# Patient Record
Sex: Female | Born: 1955 | Race: Black or African American | Hispanic: No | Smoking: Never smoker
Health system: Southern US, Community
[De-identification: ages and names within clinical notes are randomized; demographics above are authoritative.]

## PROBLEM LIST (undated history)

## (undated) DIAGNOSIS — K219 Gastro-esophageal reflux disease without esophagitis: Secondary | ICD-10-CM

## (undated) DIAGNOSIS — H269 Unspecified cataract: Secondary | ICD-10-CM

## (undated) DIAGNOSIS — T7840XA Allergy, unspecified, initial encounter: Secondary | ICD-10-CM

## (undated) DIAGNOSIS — F419 Anxiety disorder, unspecified: Secondary | ICD-10-CM

## (undated) DIAGNOSIS — F329 Major depressive disorder, single episode, unspecified: Secondary | ICD-10-CM

## (undated) DIAGNOSIS — M1990 Unspecified osteoarthritis, unspecified site: Secondary | ICD-10-CM

## (undated) DIAGNOSIS — D219 Benign neoplasm of connective and other soft tissue, unspecified: Secondary | ICD-10-CM

## (undated) DIAGNOSIS — E785 Hyperlipidemia, unspecified: Secondary | ICD-10-CM

## (undated) DIAGNOSIS — E079 Disorder of thyroid, unspecified: Secondary | ICD-10-CM

## (undated) DIAGNOSIS — F32A Depression, unspecified: Secondary | ICD-10-CM

## (undated) DIAGNOSIS — I10 Essential (primary) hypertension: Secondary | ICD-10-CM

## (undated) DIAGNOSIS — J45909 Unspecified asthma, uncomplicated: Secondary | ICD-10-CM

## (undated) HISTORY — DX: Unspecified osteoarthritis, unspecified site: M19.90

## (undated) HISTORY — DX: Gastro-esophageal reflux disease without esophagitis: K21.9

## (undated) HISTORY — DX: Hyperlipidemia, unspecified: E78.5

## (undated) HISTORY — DX: Unspecified cataract: H26.9

## (undated) HISTORY — DX: Unspecified asthma, uncomplicated: J45.909

## (undated) HISTORY — DX: Disorder of thyroid, unspecified: E07.9

## (undated) HISTORY — PX: HERNIA REPAIR: SHX51

## (undated) HISTORY — PX: LIPOMA EXCISION: SHX5283

## (undated) HISTORY — PX: OTHER SURGICAL HISTORY: SHX169

## (undated) HISTORY — DX: Allergy, unspecified, initial encounter: T78.40XA

---

## 1997-09-30 ENCOUNTER — Encounter: Admission: RE | Admit: 1997-09-30 | Discharge: 1997-09-30 | Payer: Self-pay | Admitting: Family Medicine

## 1997-10-14 ENCOUNTER — Other Ambulatory Visit: Admission: RE | Admit: 1997-10-14 | Discharge: 1997-10-14 | Payer: Self-pay

## 1997-10-14 ENCOUNTER — Encounter: Admission: RE | Admit: 1997-10-14 | Discharge: 1997-10-14 | Payer: Self-pay | Admitting: Family Medicine

## 1997-10-25 ENCOUNTER — Emergency Department (HOSPITAL_COMMUNITY): Admission: EM | Admit: 1997-10-25 | Discharge: 1997-10-25 | Payer: Self-pay | Admitting: Emergency Medicine

## 1997-10-26 ENCOUNTER — Encounter: Admission: RE | Admit: 1997-10-26 | Discharge: 1997-10-26 | Payer: Self-pay | Admitting: Family Medicine

## 1997-11-04 ENCOUNTER — Encounter: Admission: RE | Admit: 1997-11-04 | Discharge: 1997-11-04 | Payer: Self-pay | Admitting: Family Medicine

## 1997-11-06 ENCOUNTER — Emergency Department (HOSPITAL_COMMUNITY): Admission: EM | Admit: 1997-11-06 | Discharge: 1997-11-06 | Payer: Self-pay | Admitting: Emergency Medicine

## 1997-11-09 ENCOUNTER — Encounter: Admission: RE | Admit: 1997-11-09 | Discharge: 1997-11-09 | Payer: Self-pay | Admitting: Sports Medicine

## 1997-11-24 ENCOUNTER — Encounter: Admission: RE | Admit: 1997-11-24 | Discharge: 1997-11-24 | Payer: Self-pay | Admitting: Family Medicine

## 1998-01-21 ENCOUNTER — Emergency Department (HOSPITAL_COMMUNITY): Admission: EM | Admit: 1998-01-21 | Discharge: 1998-01-21 | Payer: Self-pay | Admitting: Emergency Medicine

## 1998-01-24 ENCOUNTER — Emergency Department (HOSPITAL_COMMUNITY): Admission: EM | Admit: 1998-01-24 | Discharge: 1998-01-24 | Payer: Self-pay | Admitting: Emergency Medicine

## 1998-03-16 ENCOUNTER — Encounter: Admission: RE | Admit: 1998-03-16 | Discharge: 1998-03-16 | Payer: Self-pay | Admitting: Family Medicine

## 1998-03-18 ENCOUNTER — Encounter: Admission: RE | Admit: 1998-03-18 | Discharge: 1998-03-18 | Payer: Self-pay | Admitting: Family Medicine

## 1998-07-18 ENCOUNTER — Emergency Department (HOSPITAL_COMMUNITY): Admission: EM | Admit: 1998-07-18 | Discharge: 1998-07-18 | Payer: Self-pay | Admitting: Emergency Medicine

## 1998-08-23 ENCOUNTER — Encounter: Admission: RE | Admit: 1998-08-23 | Discharge: 1998-08-23 | Payer: Self-pay | Admitting: Family Medicine

## 1998-10-12 ENCOUNTER — Encounter: Admission: RE | Admit: 1998-10-12 | Discharge: 1998-10-12 | Payer: Self-pay | Admitting: Family Medicine

## 1998-10-13 ENCOUNTER — Emergency Department (HOSPITAL_COMMUNITY): Admission: EM | Admit: 1998-10-13 | Discharge: 1998-10-13 | Payer: Self-pay | Admitting: Emergency Medicine

## 1998-10-19 ENCOUNTER — Encounter: Admission: RE | Admit: 1998-10-19 | Discharge: 1998-10-19 | Payer: Self-pay | Admitting: Family Medicine

## 1998-10-26 ENCOUNTER — Encounter: Admission: RE | Admit: 1998-10-26 | Discharge: 1998-10-26 | Payer: Self-pay | Admitting: Family Medicine

## 1998-10-28 ENCOUNTER — Emergency Department (HOSPITAL_COMMUNITY): Admission: EM | Admit: 1998-10-28 | Discharge: 1998-10-28 | Payer: Self-pay | Admitting: Emergency Medicine

## 1998-11-11 ENCOUNTER — Emergency Department (HOSPITAL_COMMUNITY): Admission: EM | Admit: 1998-11-11 | Discharge: 1998-11-11 | Payer: Self-pay | Admitting: Emergency Medicine

## 1998-11-17 ENCOUNTER — Encounter: Admission: RE | Admit: 1998-11-17 | Discharge: 1998-11-17 | Payer: Self-pay | Admitting: Family Medicine

## 1998-11-18 ENCOUNTER — Encounter: Admission: RE | Admit: 1998-11-18 | Discharge: 1998-11-18 | Payer: Self-pay | Admitting: Sports Medicine

## 1998-11-22 ENCOUNTER — Encounter: Admission: RE | Admit: 1998-11-22 | Discharge: 1998-11-22 | Payer: Self-pay | Admitting: Sports Medicine

## 1998-12-07 ENCOUNTER — Encounter: Admission: RE | Admit: 1998-12-07 | Discharge: 1998-12-07 | Payer: Self-pay | Admitting: Family Medicine

## 1998-12-19 ENCOUNTER — Encounter: Admission: RE | Admit: 1998-12-19 | Discharge: 1998-12-19 | Payer: Self-pay | Admitting: Family Medicine

## 1999-01-03 ENCOUNTER — Encounter: Admission: RE | Admit: 1999-01-03 | Discharge: 1999-01-03 | Payer: Self-pay | Admitting: Sports Medicine

## 1999-01-07 ENCOUNTER — Emergency Department (HOSPITAL_COMMUNITY): Admission: EM | Admit: 1999-01-07 | Discharge: 1999-01-07 | Payer: Self-pay

## 1999-01-31 ENCOUNTER — Encounter: Admission: RE | Admit: 1999-01-31 | Discharge: 1999-01-31 | Payer: Self-pay | Admitting: Sports Medicine

## 1999-02-04 ENCOUNTER — Emergency Department (HOSPITAL_COMMUNITY): Admission: EM | Admit: 1999-02-04 | Discharge: 1999-02-04 | Payer: Self-pay | Admitting: Emergency Medicine

## 1999-03-14 ENCOUNTER — Encounter: Admission: RE | Admit: 1999-03-14 | Discharge: 1999-03-14 | Payer: Self-pay | Admitting: Sports Medicine

## 1999-06-17 ENCOUNTER — Emergency Department (HOSPITAL_COMMUNITY): Admission: EM | Admit: 1999-06-17 | Discharge: 1999-06-17 | Payer: Self-pay | Admitting: Emergency Medicine

## 1999-07-11 ENCOUNTER — Encounter: Admission: RE | Admit: 1999-07-11 | Discharge: 1999-07-11 | Payer: Self-pay | Admitting: Sports Medicine

## 1999-07-21 ENCOUNTER — Encounter: Admission: RE | Admit: 1999-07-21 | Discharge: 1999-07-21 | Payer: Self-pay | Admitting: Family Medicine

## 1999-08-08 ENCOUNTER — Emergency Department (HOSPITAL_COMMUNITY): Admission: EM | Admit: 1999-08-08 | Discharge: 1999-08-08 | Payer: Self-pay | Admitting: Emergency Medicine

## 1999-08-10 ENCOUNTER — Emergency Department (HOSPITAL_COMMUNITY): Admission: EM | Admit: 1999-08-10 | Discharge: 1999-08-10 | Payer: Self-pay | Admitting: Emergency Medicine

## 1999-08-10 ENCOUNTER — Encounter: Admission: RE | Admit: 1999-08-10 | Discharge: 1999-08-10 | Payer: Self-pay | Admitting: Family Medicine

## 1999-08-17 ENCOUNTER — Encounter: Payer: Self-pay | Admitting: *Deleted

## 1999-08-17 ENCOUNTER — Encounter: Admission: RE | Admit: 1999-08-17 | Discharge: 1999-08-17 | Payer: Self-pay | Admitting: *Deleted

## 1999-08-23 ENCOUNTER — Other Ambulatory Visit: Admission: RE | Admit: 1999-08-23 | Discharge: 1999-08-23 | Payer: Self-pay | Admitting: Obstetrics & Gynecology

## 1999-08-23 ENCOUNTER — Encounter: Admission: RE | Admit: 1999-08-23 | Discharge: 1999-08-23 | Payer: Self-pay | Admitting: Family Medicine

## 1999-08-30 ENCOUNTER — Encounter: Admission: RE | Admit: 1999-08-30 | Discharge: 1999-08-30 | Payer: Self-pay | Admitting: Family Medicine

## 1999-11-01 ENCOUNTER — Encounter: Admission: RE | Admit: 1999-11-01 | Discharge: 1999-11-01 | Payer: Self-pay | Admitting: Family Medicine

## 1999-11-16 ENCOUNTER — Encounter: Admission: RE | Admit: 1999-11-16 | Discharge: 1999-11-16 | Payer: Self-pay | Admitting: Family Medicine

## 2000-01-02 ENCOUNTER — Emergency Department (HOSPITAL_COMMUNITY): Admission: EM | Admit: 2000-01-02 | Discharge: 2000-01-02 | Payer: Self-pay | Admitting: Emergency Medicine

## 2000-01-02 ENCOUNTER — Encounter: Payer: Self-pay | Admitting: Emergency Medicine

## 2000-03-24 ENCOUNTER — Emergency Department (HOSPITAL_COMMUNITY): Admission: EM | Admit: 2000-03-24 | Discharge: 2000-03-24 | Payer: Self-pay | Admitting: Emergency Medicine

## 2000-03-24 ENCOUNTER — Encounter: Payer: Self-pay | Admitting: Emergency Medicine

## 2000-03-29 ENCOUNTER — Encounter: Admission: RE | Admit: 2000-03-29 | Discharge: 2000-03-29 | Payer: Self-pay | Admitting: Family Medicine

## 2000-05-03 ENCOUNTER — Encounter: Admission: RE | Admit: 2000-05-03 | Discharge: 2000-05-03 | Payer: Self-pay | Admitting: Family Medicine

## 2000-06-23 ENCOUNTER — Emergency Department (HOSPITAL_COMMUNITY): Admission: EM | Admit: 2000-06-23 | Discharge: 2000-06-23 | Payer: Self-pay | Admitting: Emergency Medicine

## 2000-09-20 ENCOUNTER — Encounter: Admission: RE | Admit: 2000-09-20 | Discharge: 2000-09-20 | Payer: Self-pay | Admitting: Family Medicine

## 2000-09-24 ENCOUNTER — Encounter: Admission: RE | Admit: 2000-09-24 | Discharge: 2000-09-24 | Payer: Self-pay | Admitting: Family Medicine

## 2000-09-26 ENCOUNTER — Encounter: Admission: RE | Admit: 2000-09-26 | Discharge: 2000-09-26 | Payer: Self-pay | Admitting: Family Medicine

## 2000-10-15 ENCOUNTER — Emergency Department (HOSPITAL_COMMUNITY): Admission: EM | Admit: 2000-10-15 | Discharge: 2000-10-15 | Payer: Self-pay | Admitting: Emergency Medicine

## 2000-12-10 ENCOUNTER — Encounter: Admission: RE | Admit: 2000-12-10 | Discharge: 2000-12-10 | Payer: Self-pay | Admitting: Family Medicine

## 2001-01-27 ENCOUNTER — Other Ambulatory Visit: Admission: RE | Admit: 2001-01-27 | Discharge: 2001-01-27 | Payer: Self-pay | Admitting: *Deleted

## 2001-01-27 ENCOUNTER — Encounter: Admission: RE | Admit: 2001-01-27 | Discharge: 2001-01-27 | Payer: Self-pay | Admitting: Family Medicine

## 2001-02-19 ENCOUNTER — Encounter: Admission: RE | Admit: 2001-02-19 | Discharge: 2001-02-19 | Payer: Self-pay | Admitting: Family Medicine

## 2001-04-01 ENCOUNTER — Encounter: Admission: RE | Admit: 2001-04-01 | Discharge: 2001-04-01 | Payer: Self-pay | Admitting: Family Medicine

## 2001-05-05 ENCOUNTER — Encounter: Admission: RE | Admit: 2001-05-05 | Discharge: 2001-05-05 | Payer: Self-pay | Admitting: Family Medicine

## 2001-05-19 ENCOUNTER — Encounter: Admission: RE | Admit: 2001-05-19 | Discharge: 2001-05-19 | Payer: Self-pay | Admitting: Sports Medicine

## 2001-07-01 ENCOUNTER — Encounter: Admission: RE | Admit: 2001-07-01 | Discharge: 2001-07-01 | Payer: Self-pay | Admitting: Family Medicine

## 2001-07-14 ENCOUNTER — Encounter: Admission: RE | Admit: 2001-07-14 | Discharge: 2001-07-14 | Payer: Self-pay | Admitting: Family Medicine

## 2001-07-18 ENCOUNTER — Encounter: Admission: RE | Admit: 2001-07-18 | Discharge: 2001-07-18 | Payer: Self-pay | Admitting: Family Medicine

## 2001-08-29 ENCOUNTER — Encounter: Admission: RE | Admit: 2001-08-29 | Discharge: 2001-08-29 | Payer: Self-pay | Admitting: Family Medicine

## 2001-10-09 ENCOUNTER — Encounter: Admission: RE | Admit: 2001-10-09 | Discharge: 2001-10-09 | Payer: Self-pay | Admitting: Family Medicine

## 2001-11-27 ENCOUNTER — Emergency Department (HOSPITAL_COMMUNITY): Admission: EM | Admit: 2001-11-27 | Discharge: 2001-11-27 | Payer: Self-pay | Admitting: Emergency Medicine

## 2001-11-28 ENCOUNTER — Encounter: Admission: RE | Admit: 2001-11-28 | Discharge: 2001-11-28 | Payer: Self-pay | Admitting: Family Medicine

## 2001-12-17 ENCOUNTER — Encounter: Admission: RE | Admit: 2001-12-17 | Discharge: 2001-12-17 | Payer: Self-pay | Admitting: Family Medicine

## 2002-01-30 ENCOUNTER — Encounter: Admission: RE | Admit: 2002-01-30 | Discharge: 2002-01-30 | Payer: Self-pay | Admitting: Family Medicine

## 2002-03-26 ENCOUNTER — Encounter: Admission: RE | Admit: 2002-03-26 | Discharge: 2002-03-26 | Payer: Self-pay | Admitting: Family Medicine

## 2002-05-06 ENCOUNTER — Emergency Department (HOSPITAL_COMMUNITY): Admission: EM | Admit: 2002-05-06 | Discharge: 2002-05-06 | Payer: Self-pay | Admitting: Emergency Medicine

## 2002-05-18 ENCOUNTER — Encounter: Admission: RE | Admit: 2002-05-18 | Discharge: 2002-05-18 | Payer: Self-pay | Admitting: Family Medicine

## 2002-05-28 ENCOUNTER — Encounter: Admission: RE | Admit: 2002-05-28 | Discharge: 2002-05-28 | Payer: Self-pay | Admitting: Family Medicine

## 2002-06-29 ENCOUNTER — Encounter: Admission: RE | Admit: 2002-06-29 | Discharge: 2002-06-29 | Payer: Self-pay | Admitting: Sports Medicine

## 2002-07-28 ENCOUNTER — Encounter: Admission: RE | Admit: 2002-07-28 | Discharge: 2002-07-28 | Payer: Self-pay | Admitting: Family Medicine

## 2002-08-06 ENCOUNTER — Ambulatory Visit (HOSPITAL_COMMUNITY): Admission: RE | Admit: 2002-08-06 | Discharge: 2002-08-06 | Payer: Self-pay | Admitting: Sports Medicine

## 2002-08-23 ENCOUNTER — Emergency Department (HOSPITAL_COMMUNITY): Admission: EM | Admit: 2002-08-23 | Discharge: 2002-08-24 | Payer: Self-pay | Admitting: Emergency Medicine

## 2002-09-04 ENCOUNTER — Encounter: Admission: RE | Admit: 2002-09-04 | Discharge: 2002-09-04 | Payer: Self-pay | Admitting: Family Medicine

## 2002-11-05 ENCOUNTER — Encounter: Admission: RE | Admit: 2002-11-05 | Discharge: 2002-11-05 | Payer: Self-pay | Admitting: Family Medicine

## 2002-12-08 ENCOUNTER — Encounter: Admission: RE | Admit: 2002-12-08 | Discharge: 2002-12-08 | Payer: Self-pay | Admitting: Sports Medicine

## 2003-05-14 ENCOUNTER — Emergency Department (HOSPITAL_COMMUNITY): Admission: AD | Admit: 2003-05-14 | Discharge: 2003-05-14 | Payer: Self-pay | Admitting: Family Medicine

## 2003-08-18 ENCOUNTER — Ambulatory Visit (HOSPITAL_COMMUNITY): Admission: RE | Admit: 2003-08-18 | Discharge: 2003-08-18 | Payer: Self-pay | Admitting: Sports Medicine

## 2003-09-13 ENCOUNTER — Other Ambulatory Visit: Admission: RE | Admit: 2003-09-13 | Discharge: 2003-09-13 | Payer: Self-pay | Admitting: Family Medicine

## 2003-09-13 ENCOUNTER — Encounter: Admission: RE | Admit: 2003-09-13 | Discharge: 2003-09-13 | Payer: Self-pay | Admitting: Family Medicine

## 2003-09-29 ENCOUNTER — Emergency Department (HOSPITAL_COMMUNITY): Admission: EM | Admit: 2003-09-29 | Discharge: 2003-09-29 | Payer: Self-pay | Admitting: *Deleted

## 2004-02-02 ENCOUNTER — Ambulatory Visit: Payer: Self-pay | Admitting: Family Medicine

## 2004-03-16 ENCOUNTER — Ambulatory Visit: Payer: Self-pay | Admitting: Family Medicine

## 2004-04-14 ENCOUNTER — Ambulatory Visit: Payer: Self-pay | Admitting: Family Medicine

## 2004-08-09 ENCOUNTER — Ambulatory Visit: Payer: Self-pay | Admitting: Family Medicine

## 2005-02-19 ENCOUNTER — Ambulatory Visit (HOSPITAL_COMMUNITY): Admission: RE | Admit: 2005-02-19 | Discharge: 2005-02-19 | Payer: Self-pay | Admitting: Family Medicine

## 2005-02-24 ENCOUNTER — Emergency Department (HOSPITAL_COMMUNITY): Admission: EM | Admit: 2005-02-24 | Discharge: 2005-02-24 | Payer: Self-pay | Admitting: Emergency Medicine

## 2005-04-26 ENCOUNTER — Ambulatory Visit: Payer: Self-pay | Admitting: Family Medicine

## 2005-04-26 ENCOUNTER — Other Ambulatory Visit: Admission: RE | Admit: 2005-04-26 | Discharge: 2005-04-26 | Payer: Self-pay | Admitting: Family Medicine

## 2005-04-28 ENCOUNTER — Encounter (INDEPENDENT_AMBULATORY_CARE_PROVIDER_SITE_OTHER): Payer: Self-pay | Admitting: *Deleted

## 2005-06-22 ENCOUNTER — Ambulatory Visit: Payer: Self-pay | Admitting: Family Medicine

## 2005-10-13 ENCOUNTER — Emergency Department (HOSPITAL_COMMUNITY): Admission: EM | Admit: 2005-10-13 | Discharge: 2005-10-13 | Payer: Self-pay | Admitting: Family Medicine

## 2005-12-21 ENCOUNTER — Ambulatory Visit: Payer: Self-pay | Admitting: Family Medicine

## 2006-03-21 DIAGNOSIS — I10 Essential (primary) hypertension: Secondary | ICD-10-CM | POA: Insufficient documentation

## 2006-03-21 DIAGNOSIS — D259 Leiomyoma of uterus, unspecified: Secondary | ICD-10-CM

## 2006-03-21 HISTORY — DX: Leiomyoma of uterus, unspecified: D25.9

## 2006-03-22 ENCOUNTER — Ambulatory Visit (HOSPITAL_COMMUNITY): Admission: RE | Admit: 2006-03-22 | Discharge: 2006-03-22 | Payer: Self-pay | Admitting: Internal Medicine

## 2006-03-22 ENCOUNTER — Encounter (INDEPENDENT_AMBULATORY_CARE_PROVIDER_SITE_OTHER): Payer: Self-pay | Admitting: *Deleted

## 2006-05-27 ENCOUNTER — Telehealth (INDEPENDENT_AMBULATORY_CARE_PROVIDER_SITE_OTHER): Payer: Self-pay | Admitting: *Deleted

## 2006-05-29 ENCOUNTER — Emergency Department (HOSPITAL_COMMUNITY): Admission: EM | Admit: 2006-05-29 | Discharge: 2006-05-29 | Payer: Self-pay | Admitting: Family Medicine

## 2006-06-10 ENCOUNTER — Telehealth (INDEPENDENT_AMBULATORY_CARE_PROVIDER_SITE_OTHER): Payer: Self-pay | Admitting: *Deleted

## 2006-06-11 ENCOUNTER — Ambulatory Visit: Payer: Self-pay | Admitting: Family Medicine

## 2006-06-11 ENCOUNTER — Encounter (INDEPENDENT_AMBULATORY_CARE_PROVIDER_SITE_OTHER): Payer: Self-pay | Admitting: Family Medicine

## 2006-06-11 LAB — CONVERTED CEMR LAB
Chlamydia, DNA Probe: NEGATIVE
KOH Prep: NEGATIVE
Ketones, urine, test strip: NEGATIVE
Nitrite: NEGATIVE
Urobilinogen, UA: 0.2
WBC Urine, dipstick: NEGATIVE
pH: 6

## 2006-07-05 ENCOUNTER — Encounter (INDEPENDENT_AMBULATORY_CARE_PROVIDER_SITE_OTHER): Payer: Self-pay | Admitting: Family Medicine

## 2006-07-05 ENCOUNTER — Ambulatory Visit: Payer: Self-pay | Admitting: Family Medicine

## 2006-07-05 DIAGNOSIS — Z8639 Personal history of other endocrine, nutritional and metabolic disease: Secondary | ICD-10-CM

## 2006-07-05 DIAGNOSIS — Z862 Personal history of diseases of the blood and blood-forming organs and certain disorders involving the immune mechanism: Secondary | ICD-10-CM | POA: Insufficient documentation

## 2006-07-05 LAB — CONVERTED CEMR LAB
CO2: 25 meq/L (ref 19–32)
Calcium: 9.5 mg/dL (ref 8.4–10.5)
Cholesterol: 171 mg/dL (ref 0–200)
Glucose, Bld: 108 mg/dL — ABNORMAL HIGH (ref 70–99)
HDL: 58 mg/dL (ref >39)
LDL Cholesterol: 100 mg/dL — ABNORMAL HIGH (ref 0–99)
Potassium: 3.6 meq/L (ref 3.5–5.3)
Sodium: 137 meq/L (ref 135–145)
TSH: 1.474 microintl units/mL (ref 0.350–5.50)
Total CHOL/HDL Ratio: 2.9
Triglycerides: 63 mg/dL (ref <150)

## 2006-07-09 ENCOUNTER — Encounter: Admission: RE | Admit: 2006-07-09 | Discharge: 2006-07-09 | Payer: Self-pay | Admitting: Sports Medicine

## 2006-07-11 ENCOUNTER — Telehealth: Payer: Self-pay | Admitting: *Deleted

## 2006-09-08 ENCOUNTER — Emergency Department (HOSPITAL_COMMUNITY): Admission: EM | Admit: 2006-09-08 | Discharge: 2006-09-08 | Payer: Self-pay | Admitting: Emergency Medicine

## 2007-02-26 ENCOUNTER — Ambulatory Visit: Payer: Self-pay | Admitting: Family Medicine

## 2007-02-26 ENCOUNTER — Encounter (INDEPENDENT_AMBULATORY_CARE_PROVIDER_SITE_OTHER): Payer: Self-pay | Admitting: Family Medicine

## 2007-02-26 LAB — CONVERTED CEMR LAB
Chlamydia, DNA Probe: NEGATIVE
GC Probe Amp, Genital: NEGATIVE
Whiff Test: POSITIVE

## 2007-02-27 ENCOUNTER — Telehealth (INDEPENDENT_AMBULATORY_CARE_PROVIDER_SITE_OTHER): Payer: Self-pay | Admitting: Family Medicine

## 2007-03-06 ENCOUNTER — Telehealth: Payer: Self-pay | Admitting: *Deleted

## 2007-03-14 ENCOUNTER — Telehealth: Payer: Self-pay | Admitting: *Deleted

## 2007-04-15 ENCOUNTER — Encounter: Payer: Self-pay | Admitting: Family Medicine

## 2007-04-15 ENCOUNTER — Ambulatory Visit: Payer: Self-pay | Admitting: Family Medicine

## 2007-04-15 LAB — CONVERTED CEMR LAB
Nitrite: NEGATIVE
Urobilinogen, UA: 0.2
pH: 6

## 2007-04-16 ENCOUNTER — Encounter: Payer: Self-pay | Admitting: Family Medicine

## 2007-06-18 ENCOUNTER — Telehealth: Payer: Self-pay | Admitting: *Deleted

## 2007-06-19 ENCOUNTER — Ambulatory Visit: Payer: Self-pay | Admitting: Family Medicine

## 2007-11-19 ENCOUNTER — Ambulatory Visit: Payer: Self-pay | Admitting: Family Medicine

## 2007-11-19 ENCOUNTER — Telehealth (INDEPENDENT_AMBULATORY_CARE_PROVIDER_SITE_OTHER): Payer: Self-pay | Admitting: *Deleted

## 2007-11-27 ENCOUNTER — Telehealth (INDEPENDENT_AMBULATORY_CARE_PROVIDER_SITE_OTHER): Payer: Self-pay | Admitting: *Deleted

## 2007-11-30 ENCOUNTER — Emergency Department (HOSPITAL_COMMUNITY): Admission: EM | Admit: 2007-11-30 | Discharge: 2007-11-30 | Payer: Self-pay | Admitting: Emergency Medicine

## 2007-12-29 ENCOUNTER — Telehealth: Payer: Self-pay | Admitting: *Deleted

## 2007-12-29 ENCOUNTER — Ambulatory Visit: Payer: Self-pay | Admitting: Family Medicine

## 2007-12-29 ENCOUNTER — Encounter: Payer: Self-pay | Admitting: Family Medicine

## 2007-12-29 LAB — CONVERTED CEMR LAB
Bilirubin Urine: NEGATIVE
Chlamydia, DNA Probe: NEGATIVE
GC Probe Amp, Genital: NEGATIVE
Glucose, Urine, Semiquant: NEGATIVE
Nitrite: NEGATIVE
Protein, U semiquant: 30
Specific Gravity, Urine: 1.025
Urobilinogen, UA: 1
pH: 7

## 2008-01-19 ENCOUNTER — Ambulatory Visit: Payer: Self-pay | Admitting: Family Medicine

## 2008-01-19 ENCOUNTER — Encounter: Payer: Self-pay | Admitting: Family Medicine

## 2008-01-19 DIAGNOSIS — E669 Obesity, unspecified: Secondary | ICD-10-CM | POA: Insufficient documentation

## 2008-01-21 LAB — CONVERTED CEMR LAB
BUN: 10 mg/dL (ref 6–23)
CO2: 26 meq/L (ref 19–32)
Calcium: 9.1 mg/dL (ref 8.4–10.5)
Chloride: 106 meq/L (ref 96–112)
Potassium: 4 meq/L (ref 3.5–5.3)
Sodium: 142 meq/L (ref 135–145)

## 2008-02-04 ENCOUNTER — Telehealth: Payer: Self-pay | Admitting: *Deleted

## 2008-03-24 ENCOUNTER — Telehealth: Payer: Self-pay | Admitting: Family Medicine

## 2008-03-26 ENCOUNTER — Ambulatory Visit: Payer: Self-pay | Admitting: Family Medicine

## 2008-04-06 ENCOUNTER — Telehealth: Payer: Self-pay | Admitting: *Deleted

## 2008-04-09 ENCOUNTER — Ambulatory Visit: Payer: Self-pay | Admitting: Family Medicine

## 2008-04-13 ENCOUNTER — Ambulatory Visit: Payer: Self-pay | Admitting: Family Medicine

## 2008-04-28 ENCOUNTER — Telehealth: Payer: Self-pay | Admitting: Family Medicine

## 2008-04-28 ENCOUNTER — Ambulatory Visit: Payer: Self-pay | Admitting: Family Medicine

## 2008-04-28 LAB — CONVERTED CEMR LAB: Whiff Test: POSITIVE

## 2008-05-04 ENCOUNTER — Telehealth: Payer: Self-pay | Admitting: Family Medicine

## 2008-05-05 ENCOUNTER — Telehealth: Payer: Self-pay | Admitting: Family Medicine

## 2008-05-11 ENCOUNTER — Telehealth: Payer: Self-pay | Admitting: Family Medicine

## 2008-05-28 ENCOUNTER — Encounter: Payer: Self-pay | Admitting: Family Medicine

## 2008-05-28 ENCOUNTER — Ambulatory Visit: Payer: Self-pay | Admitting: Family Medicine

## 2008-05-28 LAB — CONVERTED CEMR LAB: Whiff Test: NEGATIVE

## 2008-06-17 ENCOUNTER — Telehealth: Payer: Self-pay | Admitting: Family Medicine

## 2008-08-31 ENCOUNTER — Telehealth: Payer: Self-pay | Admitting: *Deleted

## 2008-08-31 ENCOUNTER — Ambulatory Visit: Payer: Self-pay | Admitting: Family Medicine

## 2008-09-21 ENCOUNTER — Encounter: Admission: RE | Admit: 2008-09-21 | Discharge: 2008-09-21 | Payer: Self-pay | Admitting: Family Medicine

## 2008-09-28 ENCOUNTER — Encounter: Payer: Self-pay | Admitting: Family Medicine

## 2008-09-28 ENCOUNTER — Telehealth: Payer: Self-pay | Admitting: Family Medicine

## 2008-09-30 ENCOUNTER — Ambulatory Visit: Payer: Self-pay | Admitting: Family Medicine

## 2008-09-30 ENCOUNTER — Encounter: Payer: Self-pay | Admitting: Family Medicine

## 2008-09-30 LAB — CONVERTED CEMR LAB
Specific Gravity, Urine: 1.025
Urobilinogen, UA: 0.2
WBC Urine, dipstick: NEGATIVE
pH: 6

## 2008-10-01 LAB — CONVERTED CEMR LAB
CO2: 23 meq/L (ref 19–32)
Calcium: 9 mg/dL (ref 8.4–10.5)
Chloride: 108 meq/L (ref 96–112)
Creatinine, Ser: 0.64 mg/dL (ref 0.40–1.20)
Potassium: 3.4 meq/L — ABNORMAL LOW (ref 3.5–5.3)

## 2009-01-12 ENCOUNTER — Ambulatory Visit: Payer: Self-pay | Admitting: Family Medicine

## 2009-01-18 ENCOUNTER — Encounter: Payer: Self-pay | Admitting: Family Medicine

## 2009-01-18 ENCOUNTER — Ambulatory Visit: Payer: Self-pay | Admitting: Family Medicine

## 2009-01-18 ENCOUNTER — Telehealth: Payer: Self-pay | Admitting: Family Medicine

## 2009-01-19 LAB — CONVERTED CEMR LAB
Alkaline Phosphatase: 76 units/L (ref 39–117)
CO2: 26 meq/L (ref 19–32)
Calcium: 9.1 mg/dL (ref 8.4–10.5)
Creatinine, Ser: 0.68 mg/dL (ref 0.40–1.20)
Potassium: 3 meq/L — ABNORMAL LOW (ref 3.5–5.3)
Sodium: 143 meq/L (ref 135–145)
Total Protein: 7.3 g/dL (ref 6.0–8.3)

## 2009-01-20 ENCOUNTER — Telehealth: Payer: Self-pay | Admitting: *Deleted

## 2009-02-21 ENCOUNTER — Encounter (INDEPENDENT_AMBULATORY_CARE_PROVIDER_SITE_OTHER): Payer: Self-pay | Admitting: *Deleted

## 2009-03-14 ENCOUNTER — Telehealth: Payer: Self-pay | Admitting: Family Medicine

## 2009-03-14 ENCOUNTER — Ambulatory Visit: Payer: Self-pay | Admitting: Family Medicine

## 2009-03-14 ENCOUNTER — Encounter: Payer: Self-pay | Admitting: Family Medicine

## 2009-03-15 LAB — CONVERTED CEMR LAB
BUN: 11 mg/dL (ref 6–23)
Calcium: 9.1 mg/dL (ref 8.4–10.5)
Cholesterol: 173 mg/dL (ref 0–200)
Creatinine, Ser: 0.7 mg/dL (ref 0.40–1.20)
Glucose, Bld: 101 mg/dL — ABNORMAL HIGH (ref 70–99)
TSH: 1.017 microintl units/mL (ref 0.350–4.500)
Total CHOL/HDL Ratio: 2.9
VLDL: 21 mg/dL (ref 0–40)

## 2009-03-29 ENCOUNTER — Telehealth: Payer: Self-pay | Admitting: Family Medicine

## 2009-03-30 ENCOUNTER — Encounter: Payer: Self-pay | Admitting: Family Medicine

## 2009-03-30 ENCOUNTER — Ambulatory Visit: Payer: Self-pay | Admitting: Family Medicine

## 2009-03-30 DIAGNOSIS — E876 Hypokalemia: Secondary | ICD-10-CM

## 2009-03-30 HISTORY — DX: Hypokalemia: E87.6

## 2009-03-30 LAB — CONVERTED CEMR LAB
Bilirubin Urine: NEGATIVE
Nitrite: NEGATIVE
WBC Urine, dipstick: NEGATIVE
Whiff Test: NEGATIVE

## 2009-04-01 ENCOUNTER — Encounter (INDEPENDENT_AMBULATORY_CARE_PROVIDER_SITE_OTHER): Payer: Self-pay | Admitting: *Deleted

## 2009-04-01 LAB — CONVERTED CEMR LAB
Chlamydia, DNA Probe: NEGATIVE
GC Probe Amp, Genital: NEGATIVE

## 2009-08-19 ENCOUNTER — Ambulatory Visit: Payer: Self-pay | Admitting: Family Medicine

## 2009-08-19 LAB — CONVERTED CEMR LAB: Whiff Test: NEGATIVE

## 2009-08-20 ENCOUNTER — Emergency Department (HOSPITAL_COMMUNITY): Admission: EM | Admit: 2009-08-20 | Discharge: 2009-08-20 | Payer: Self-pay | Admitting: Emergency Medicine

## 2009-09-30 ENCOUNTER — Encounter: Payer: Self-pay | Admitting: Family Medicine

## 2009-11-08 ENCOUNTER — Telehealth: Payer: Self-pay | Admitting: Family Medicine

## 2009-11-10 ENCOUNTER — Encounter: Payer: Self-pay | Admitting: Family Medicine

## 2009-11-10 ENCOUNTER — Ambulatory Visit: Payer: Self-pay | Admitting: Family Medicine

## 2009-11-10 LAB — CONVERTED CEMR LAB
CO2: 25 meq/L (ref 19–32)
Calcium: 9.7 mg/dL (ref 8.4–10.5)
Glucose, Bld: 94 mg/dL (ref 70–99)
Potassium: 3.8 meq/L (ref 3.5–5.3)
Sodium: 141 meq/L (ref 135–145)

## 2009-11-11 ENCOUNTER — Encounter: Payer: Self-pay | Admitting: Family Medicine

## 2009-11-25 ENCOUNTER — Encounter: Payer: Self-pay | Admitting: Family Medicine

## 2009-11-25 DIAGNOSIS — R87610 Atypical squamous cells of undetermined significance on cytologic smear of cervix (ASC-US): Secondary | ICD-10-CM

## 2009-11-25 HISTORY — DX: Atypical squamous cells of undetermined significance on cytologic smear of cervix (ASC-US): R87.610

## 2009-12-08 ENCOUNTER — Encounter: Payer: Self-pay | Admitting: Family Medicine

## 2009-12-12 ENCOUNTER — Encounter: Payer: Self-pay | Admitting: Family Medicine

## 2009-12-21 ENCOUNTER — Telehealth: Payer: Self-pay | Admitting: Family Medicine

## 2010-02-02 ENCOUNTER — Ambulatory Visit: Admit: 2010-02-02 | Payer: Self-pay

## 2010-02-02 ENCOUNTER — Ambulatory Visit: Admission: RE | Admit: 2010-02-02 | Discharge: 2010-02-02 | Payer: Self-pay | Source: Home / Self Care

## 2010-02-02 DIAGNOSIS — S139XXA Sprain of joints and ligaments of unspecified parts of neck, initial encounter: Secondary | ICD-10-CM

## 2010-02-02 HISTORY — DX: Sprain of joints and ligaments of unspecified parts of neck, initial encounter: S13.9XXA

## 2010-02-12 ENCOUNTER — Encounter: Payer: Self-pay | Admitting: Family Medicine

## 2010-02-21 NOTE — Assessment & Plan Note (Signed)
Summary: yeast infection   Vital Signs:  Patient profile:   55 year old female Height:      64.75 inches Weight:      180.1 pounds BMI:     30.31 Temp:     99.4 degrees F oral Pulse rate:   98 / minute BP sitting:   143 / 90  (left arm) Cuff size:   regular  Vitals Entered By: Gladstone Pih (March 30, 2009 9:56 AM)  Serial Vital Signs/Assessments:  Time      Position  BP       Pulse  Resp  Temp     By 10:06 AM            132/84                         Gladstone Pih  Comments: 10:06 AM re checked manually By: Gladstone Pih   CC: C/o Vaginal irritation, rash, burning itching,small amt vag D/c whitish in color Is Patient Diabetic? No Pain Assessment Patient in pain? no        Primary Care Provider:  Marisue Ivan  MD  CC:  C/o Vaginal irritation, rash, burning itching, and small amt vag D/c whitish in color.  History of Present Illness: 55yo F w/ vaginal irritation  Vaginal irritation: x 3 days.  Course is unchanged.  Itching, rash, dysuria.  Minimal discharge.  No vaginal bleeding.  No fevers.  Has not tried any medications.  Wants to be checked for GC/Chl as well.  No coital related pain.  No abd/pelvic pain.  Habits & Providers  Alcohol-Tobacco-Diet     Tobacco Status: never  Current Medications (verified): 1)  Bayer Childrens Aspirin 81 Mg Chew (Aspirin) .... Take 1 Tablet By Mouth Once A Day 2)  Hydrochlorothiazide 25 Mg  Tabs (Hydrochlorothiazide) .... Take 1 Tab By Mouth Every Morning 3)  Klor-Con 20 Meq Pack (Potassium Chloride) .Marland Kitchen.. 1 Tab By Mouth Two Times A Day 4)  Fluconazole 150 Mg Tabs (Fluconazole) .Marland Kitchen.. 1 Tab By Mouth X 1  Allergies (verified): 1)  ! Morphine  Review of Systems        Itching, rash, dysuria.  Minimal discharge.  No vaginal bleeding.  No fevers.   Physical Exam  General:  VS Reviewed. Well appearing, NAD.  Abdomen:  soft, NT, ND Genitalia:  Pelvic Exam:        External: normal female genitalia without lesions or  masses        Vagina: normal without lesions or masses; moderate white discharge in vaginal vault        Cervix: normal without lesions or masses        Adnexa: normal bimanual exam without masses or fullness        Uterus: normal by palpation        Pap smear: not performed   Impression & Recommendations:  Problem # 1:  CANDIDIASIS, VAGINAL (ICD-112.1) Assessment New  Wet prep confirms yeast. Treat with fluconazole x 1 GC/Chl pending.  Neg for trich and BV.  Her updated medication list for this problem includes:    Fluconazole 150 Mg Tabs (Fluconazole) .Marland Kitchen... 1 tab by mouth x 1  Orders: [facility]- Est  Level 4 ([address]782)  Complete Medication List: 1)  Bayer Childrens Aspirin 81 Mg Chew (Aspirin) .... Take 1 tablet by mouth once a day 2)  Hydrochlorothiazide 25 Mg Tabs (Hydrochlorothiazide) .... Take 1 tab by mouth every morning 3)  Klor-con 20 Meq Pack (Potassium chloride) .Marland Kitchen.. 1 tab by mouth two times a day 4)  Fluconazole 150 Mg Tabs (Fluconazole) .Marland Kitchen.. 1 tab by mouth x 1  Other Orders: Urinalysis-[facility] (00000) GC/Chlamydia-[facility] (87591/87491) Wet Prep- [facility] [phone]) Future Orders: Basic Met-[facility] ([id number]) ... 04/05/2010  Patient Instructions: 1)  Return in 1 month to recheck your potassium. 2)  I'll call you with today's lab results. Prescriptions: FLUCONAZOLE 150 MG TABS (FLUCONAZOLE) 1 tab by mouth x 1  #1 x 0   Entered and Authorized by:   Marisue Ivan  MD   Signed by:   Marisue Ivan  MD on 03/30/2009   Method used:   Electronically to        [facility] [address] #[id number]* (retail)       [address] [place], [place]  [address]       Ph: [phone]       Fax: [phone]   RxID:   [id number]   Laboratory Results   Urine Tests  Date/Time Received: March 30, 2009 10:44 AM  Date/Time Reported: March 30, 2009 11:30 AM   Routine Urinalysis   Color: yellow Appearance: Clear Glucose: negative   (Normal Range: Negative) Bilirubin: negative    (Normal Range: Negative) Ketone: trace (5)   (Normal Range: Negative) Spec. Gravity: 1.020   (Normal Range: 1.003-1.035) Blood: small   (Normal Range: Negative) pH: 6.5   (Normal Range: 5.0-8.0) Protein: trace   (Normal Range: Negative) Urobilinogen: 0.2   (Normal Range: 0-1) Nitrite: negative   (Normal Range: Negative) Leukocyte Esterace: negative   (Normal Range: Negative)  Urine Microscopic WBC/HPF: 1-5 RBC/HPF: 5-10 Bacteria/HPF: 1+ Mucous/HPF: 1+ Epithelial/HPF: 5-10 Yeast/HPF: rare    Comments: ...........test performed by...........Marland KitchenTerese Door, CMA  Date/Time Received: March 30, 2009 10:44 AM  Date/Time Reported: March 30, 2009 11:20 AM   [place] Source: vaginal WBC/hpf: 1-5 Bacteria/hpf: 3+  Rods Clue cells/hpf: none  Negative whiff Yeast/hpf: moderate Trichomonas/hpf: none Comments: ...........test performed by...........Marland KitchenTerese Door, CMA

## 2010-02-21 NOTE — Letter (Signed)
Summary: Results Follow-up Letter  [facility]  [address]   [place], [place] [address]   Phone: [phone]  Fax: [phone]    12/12/2009  [id number] [place], [place]  [address]  Dear Ms. Peacock,   This is to let you know the results of your recent lab tests:  The test for colon cancer (the stool cards) was negative.  We will need to repeat this next year and/or talk about a colonoscopy.  Your pap smear, unfortunately, showed that we did not get an adequate sample.  What that means is that the pathologists were unable to assess if there is something to be worried about.  As your last several pap smears have been normal, I do not feel that we need to repeat the test right now.  I would suggest that we simply plan on repeating it next year at your annual physical.  If this will cause you a lot of worry, you are free to schedule an appointment to come in for a repeat pap smear sometime in the next month or two.    Sincerely,  Majel Homer MD [facility]            Appended Document: Results Follow-up Letter Letter mailed

## 2010-02-21 NOTE — Progress Notes (Signed)
Summary: triage  Phone Note Call from Patient Call back at Work Phone [phone]   Caller: Patient Summary of Call: Thinks she has a yeast infection. Initial call taken by: Clydell Hakim,  March 29, 2009 8:40 AM  Follow-up for Phone Call        thinks she has yeast or BV. wanted appt tomorrow. she will see pcp at 9:45 Follow-up by: Golden Circle RN,  March 29, 2009 8:43 AM

## 2010-02-21 NOTE — Letter (Signed)
Summary: Generic Letter  [facility]  [address]   [place], [place] [address]   Phone: [phone]  Fax: [phone]    02/21/2009  Yvette Bell [address] [place], [place]  [address]  Dear [name],   Dr Burnadette Pop has refilled your BP meds but needs you to schedule and appointment within the next 4 weeks to follow up on your BP.  He will not be able to refill your medications again until he sees you in the office.  Please call the office at [phone] to schedula an appointment and to give [place] an updated phone number.  Thank You.  Sincerely,   Gladstone Pih LPN

## 2010-02-21 NOTE — Progress Notes (Signed)
Summary: results  Phone Note Call from Patient Call back at [phone]   Caller: Patient Summary of Call: has a question about test results Initial call taken by: De Nurse,  December 21, 2009 3:23 PM  Follow-up for Phone Call        discussd letter that Dr. Louanne Belton sent her  and explained  to her . states she will probably wait until next year to have repeat as suggested in letter. If she changes her mind she understands she can come in sooner to repeat. Follow-up by: Theresia Lo RN,  December 21, 2009 3:31 PM
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
## 2010-02-21 NOTE — Assessment & Plan Note (Signed)
Summary: f/u HTN; new URI and enlarged thyroid finding;    Vital Signs:  Patient profile:   55 year old female Height:      64.75 inches Weight:      180.9 pounds BMI:     30.45 Temp:     99.1 degrees F oral Pulse rate:   92 / minute BP sitting:   139 / 90  (left arm) Cuff size:   regular  Vitals Entered By: Gladstone Pih (March 14, 2009 1:54 PM)  Serial Vital Signs/Assessments:  Time      Position  BP       Pulse  Resp  Temp     By 1:55 PM             132/90                         Gladstone Pih  Comments: 1:55 PM re check BP manually By: Gladstone Pih   CC: Chest congestion, dry cough Is Patient Diabetic? No Pain Assessment Patient in pain? no        Primary Care Provider:  Marisue Ivan  MD  CC:  Chest congestion and dry cough.  History of Present Illness: 55yo F here for f/u HTN and c/o cold symptoms.  Cold symptoms: x 2 days.  Family with similar symptoms.  Cough (productive, nonbloody).  Chest congestion.  No rhinorrhea, sinus tenderness, or fevers.  Not taking any OTC meds.  Course is worsening especially at night.  HTN: No adverse effects from medication.  Not checking it regularly.  Was well controlled at last visit.  No dizziness, HA, CP, palpitations, or swelling.  Overweight: Gained 5lbs since last visit.  States that she is a vegetarian (semi vegetarian, still eats fish).  States that she is active but no regular exercise routine.    Preventative: Declines the flu vaccination.  Habits & Providers  Alcohol-Tobacco-Diet     Tobacco Status: never  Current Medications (verified): 1)  Bayer Childrens Aspirin 81 Mg Chew (Aspirin) .... Take 1 Tablet By Mouth Once A Day 2)  Hydrochlorothiazide 25 Mg  Tabs (Hydrochlorothiazide) .... Take 1 Tab By Mouth Every Morning  Allergies (verified): No Known Drug Allergies  Review of Systems       No dizziness, HA, CP, palpitations, or swelling.   Physical Exam  General:  VS Reviewed. Well appearing,  NAD. Occasional cough.  Eyes:  no injected conjunctiva Mouth:  moist mucus membranes no erythema, edema, or exudative tonsils Neck:  full ROM enlarged, nontender thyroid Lungs:  Normal respiratory effort, chest expands symmetrically. Lungs are clear to auscultation, no crackles or wheezes. Heart:  Normal rate and regular rhythm. S1 and S2 normal without gallop, murmur, click, rub or other extra sounds. Extremities:  no edema Neurologic:  no focal deficits Skin:  warm, dry, nl turgor and color Cervical Nodes:  no enlarged tender lymphadenopathy   Impression & Recommendations:  Problem # 1:  HYPERTENSION, BENIGN SYSTEMIC (ICD-401.1) Assessment Improved At goal (<140/90).  No changes to regimen.  Will recheck BMET today given previous low K.  She will f/u in 3 months to reassess HTN.  Her updated medication list for this problem includes:    Hydrochlorothiazide 25 Mg Tabs (Hydrochlorothiazide) .Marland Kitchen... Take 1 tab by mouth every morning  Orders: Basic Met[facility] ([id number]) [facility]- Est  Level 4 ([address])  Problem # 2:  HYPOTHYROIDISM, HX OF (ICD-V12.2) Assessment: Deteriorated Thyroid is more enlarged than upon previous exam.  Will check TSH today.  Orders: TSH-[facility] ([id number]) [facility]- Est  Level 4 ([address])  Problem # 3:  OBESITY (ICD-278.00) Assessment: Deteriorated  Gained 5lbs since last visit. Discussed healthy food choices and exercise regimen.  Goal is to lose 2lbs/month.  Reassess in 3 months.   Orders: [facility]- Est  Level 4 ([address])  Problem # 4:  URI (ICD-465.9) Assessment: New  Hx and exam c/w viral URI.  Supportive care.  The following medications were removed from the medication list:    Promethazine Hcl 25 Mg Tabs (Promethazine hcl) .Marland Kitchen... 1/2 - 1 by mouth three times a day as needed nausea.  will cause sleepiness. Her updated medication list for this problem includes:    Bayer Childrens Aspirin 81 Mg Chew (Aspirin) .Marland Kitchen... Take 1 tablet by mouth once a day  Orders: [facility]-  Est  Level 4 ([address])  Problem # 5:  HEALTH MAINTENANCE EXAM (ICD-V70.0) Assessment: Comment Only Declined flu vaccination. Recheck Lipid panel.    Complete Medication List: 1)  Bayer Childrens Aspirin 81 Mg Chew (Aspirin) .... Take 1 tablet by mouth once a day 2)  Hydrochlorothiazide 25 Mg Tabs (Hydrochlorothiazide) .... Take 1 tab by mouth every morning  Other Orders: T-Lipid Profile ([id number])  Patient Instructions: 1)  I'm going to call you at [phone] (house) in the next 2-3 days with the lab results.   2)  For the cough- Try mucinex DM and a humifier at the bedside. 3)  High Blood pressure- I gave you a 6 month supply of the hydrochlorothiazide 4)  Hypothyroid- We rechecked your TSH today. 5)  Overweight- Important to choose the right calories (low calories) and exercise 30 min/day x 7 days week (goal is 2lbs per month weight loss) Prescriptions: HYDROCHLOROTHIAZIDE 25 MG  TABS (HYDROCHLOROTHIAZIDE) Take 1 tab by mouth every morning  #90 x 1   Entered and Authorized by:   Marisue Ivan  MD   Signed by:   Marisue Ivan  MD on 03/14/2009   Method used:   Electronically to        [facility] #[id number]* (retail)       [address] [place], [place]  [address]       Ph: [phone]       Fax: [phone]   RxID:   [id number]     Prevention & Chronic Care Immunizations   Influenza vaccine: refused  (01/19/2008)   Influenza vaccine deferral: Refused  (03/14/2009)   Influenza vaccine due: 01/18/2009    Tetanus booster: 01/19/2008: given   Tetanus booster due: 01/18/2018    Pneumococcal vaccine: Not documented  Colorectal Screening   Hemoccult: normal  (05/28/2008)   Hemoccult due: 05/28/2009    Colonoscopy: refused  (05/28/2008)   Colonoscopy due: Not Indicated  Other Screening   Pap smear: NEGATIVE FOR INTRAEPITHELIAL LESIONS OR MALIGNANCY.  (05/28/2008)   Pap smear due: 03/02/2008    Mammogram: normal  (09/28/2008)   Mammogram  due: 09/28/2009   Smoking status: never  (03/14/2009)  Lipids   Total Cholesterol: 171  (07/05/2006)   Lipid panel action/deferral: Lipid Panel ordered   LDL: 100  (07/05/2006)   LDL Direct: Not documented   HDL: 58  (07/05/2006)   Triglycerides: 63  (07/05/2006)  Hypertension   Last Blood Pressure: 139 / 90  (03/14/2009)   Serum creatinine: 0.68  (01/18/2009)   Serum potassium 3.0  (01/18/2009)    Hypertension flowsheet reviewed?: Yes   Progress toward BP  goal: At goal  Self-Management Support :   Personal Goals (by the next clinic visit) :      Personal blood pressure goal: 140/90  (09/30/2008)   Patient will work on the following items until the next clinic visit to reach self-care goals:     Medications and monitoring: take my medicines every day, check my blood pressure, bring all of my medications to every visit  (03/14/2009)     Eating: drink diet soda or water instead of juice or soda, eat more vegetables, use fresh or frozen vegetables, eat foods that are low in salt, eat baked foods instead of fried foods, eat fruit for snacks and desserts, limit or avoid alcohol  (03/14/2009)    Hypertension self-management support: BP self-monitoring log, Education handout, Written self-care plan  (03/14/2009)   Hypertension self-care plan printed.   Hypertension education handout printed    Hypertension self-management support not done because: Good outcomes  (01/18/2009)

## 2010-02-21 NOTE — Progress Notes (Signed)
Summary: triage  Phone Note Call from Patient Call back at [phone]   Caller: Patient Summary of Call: has a greenish mucus cough - pls advise Initial call taken by: De Nurse,  March 14, 2009 11:22 AM  Follow-up for Phone Call        c/o chest congestion x 2 days. no meds so far. drinking hot liquids & cough drops. work in at 1:30 with pcp Follow-up by: Golden Circle RN,  March 14, 2009 11:31 AM  Additional Follow-up for Phone Call Additional follow up Details #1::        pt examined and treated in clinic today. Additional Follow-up by: Marisue Ivan  MD,  March 14, 2009 2:20 PM

## 2010-02-21 NOTE — Letter (Signed)
Summary: Results Follow-up Letter  [facility]  [address]   [place], [place] [address]   Phone: [phone]  Fax: [phone]    04/01/2009  [address] [place], [place]  [address]  Dear Ms. Baumgarner,   The following are the results of your recent test(s):  Test     Result     Pap Smear    Normal_______  Not Normal_____       Comments: _________________________________________________________ Cholesterol LDL(Bad cholesterol):          Your goal is less than:         HDL (Good cholesterol):        Your goal is more than: _________________________________________________________ Other Tests:  Your lab work was all negitive.  If you have any questions please contact me at [phone]. _________________________________________________________  Please call for an appointment Or _________________________________________________________ _________________________________________________________ _________________________________________________________  Sincerely,  [facility]

## 2010-02-21 NOTE — Miscellaneous (Signed)
Summary: Orders Update HPV charged  Clinical Lists Changes  Problems: Added new problem of ASCUS PAP (ICD-795.01) Orders: Added new Test order of HPV Typing-[facility] [phone]) - Signed

## 2010-02-21 NOTE — Assessment & Plan Note (Signed)
Summary: bv?,df   Vital Signs:  Patient profile:   55 year old female Height:      64.75 inches Weight:      182.6 pounds BMI:     30.73 Temp:     100.5 degrees F oral Pulse rate:   103 / minute BP sitting:   144 / 98  (left arm) Cuff size:   regular  Vitals Entered By: Garen Grams LPN (August 19, 2009 3:36 PM) CC: ? bv Is Patient Diabetic? No Pain Assessment Patient in pain? no        Primary Care Provider:  Marisue Ivan  MD  CC:  ? bv.  History of Present Illness: Patient here for c/o vaginal discharge x 2 weeks.  She denies any vaginal irritation, burning, itching or odor.  No new sexual partners. Recent GC/Chlam neg.   Patient also noted to have high BP 168/107, rechecked and 144/98.  Review of previous visits reveal BPs 140s/90s.  Patient on HCTZ. Discussed starting additional med and patient declined. States this is likely due to stress and recent problems with alcoholism.  She became tearful.  She has a h/o depression, not currently on meds, previously in counseling. She would like to resume counseling - numbers to AA, [facility], [facility] and Palliative Care counseling provided (mother recently died).  She denies SI/HI.   Preventive Screening-Counseling & Management  Alcohol-Tobacco     Smoking Status: never  Allergies: 1)  ! Morphine  Physical Exam  General:  Well-developed,well-nourished,in no acute distress; alert,appropriate and cooperative throughout examination. Tearful when discussing depression. Abdomen:  Bowel sounds positive,abdomen soft and non-tender without masses, organomegaly or hernias noted. Genitalia:  Normal introitus for age, no external lesions, no vaginal discharge, mucosa pink and moist, no vaginal or cervical lesions, no vaginal atrophy, no friaility or hemorrhage, normal uterus size and position, no adnexal masses or tenderness   Impression & Recommendations:  Problem # 1:  VAGINAL DISCHARGE (ICD-623.5) Negative for yeast,  trich or BV. Likely physiologic - reassurance given.  Orders: Wet Prep- [facility] (87210) [facility]- Est Level  3 (14782)  Problem # 2:  HYPERTENSION, BENIGN SYSTEMIC (ICD-401.1)  Patient declined starting additional med. Discussed lifestyle changes. Will take BPs twice weekly and follow up with PCP if remainsl elevated.  Her updated medication list for this problem includes:    Hydrochlorothiazide 25 Mg Tabs (Hydrochlorothiazide) .Marland Kitchen... 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 States Dr. Burnadette Pop restarted Celexa and she still has script on file at pharmacy.   Orders [facility]- Est Level  3 (30865)  Complete Medication List: 1)  Bayer Childrens Aspirin 81 Mg Chew (Aspirin) .... Take 1 tablet by mouth once a day 2)  Hydrochlorothiazide 25 Mg Tabs (Hydrochlorothiazide) .... Take 1 tab by mouth every morning 3)  Klor-con 20 Meq Pack (Potassium chloride) .Marland Kitchen.. 1 tab by mouth two times a day 4)  Fluconazole 150 Mg Tabs (Fluconazole) .Marland Kitchen.. 1 tab by mouth x 1  Laboratory Results  Date/Time Received: August 19, 2009 3:42 PM  Date/Time Reported: August 19, 2009 3:54 PM   [employer] Source: vaginal WBC/hpf: 1-5 Bacteria/hpf: 3+  Rods Clue cells/hpf: none  Negative whiff Yeast/hpf: none Trichomonas/hpf: none Comments: ...........test performed by...........Marland KitchenTerese Door, CMA

## 2010-02-21 NOTE — Progress Notes (Signed)
Summary: triage  Phone Note Call from Patient Call back at Work Phone [phone]   Caller: Patient Summary of Call: is having back spasms and can't move - wants to know what she can do Initial call taken by: De Nurse,  November 08, 2009 8:34 AM  Follow-up for Phone Call        back spasms x 1 day. has not taken any OTC meds. advised tylenol or ibu with food. work in appt now. states she has a ride. says she hurts to stand & walk Follow-up by: Golden Circle RN,  November 08, 2009 8:40 AM

## 2010-02-23 NOTE — Assessment & Plan Note (Signed)
Summary: head/neck pain/eo   Vital Signs:  Patient profile:   55 year old female Height:      64.75 inches Weight:      186.50 pounds BMI:     31.39 BSA:     1.92 Temp:     98.4 degrees F Pulse rate:   100 / minute BP sitting:   148 / 104  Vitals Entered By: Jone Baseman CMA (February 02, 2010 10:36 AM) CC: head and neck pain x 1 month Is Patient Diabetic? No Pain Assessment Patient in pain? yes     Location: neck Intensity: 3   Primary Care Provider:  Majel Homer MD  CC:  head and neck pain x 1 month.  History of Present Illness: 1) Neck pain: "Pulled muscle" in neck 1 month ago while lifting heavy object. Reports intermittent neck muscle (predominantly on left side) tightness and pain since then. Worse with turning to right. Worse with stress. Causes mild headache at times. Relieved by Tylenol. Relieved by heat and massage.   Denies jaw pain, chest pain, dyspnea, arm weakness, neck stiffness, severe headache, vision change, fever, neck swelling, ear pain, toothache, difficulty swallowing, sore throat.   Habits & Providers  Alcohol-Tobacco-Diet     Tobacco Status: never  Current Medications (verified): 1)  Bayer Childrens Aspirin 81 Mg Chew (Aspirin) .... Take 1 Tablet By Mouth Once A Day 2)  Hydrochlorothiazide 25 Mg  Tabs (Hydrochlorothiazide) .... Take 1 Tab By Mouth Every Morning  Allergies (verified): 1)  ! Morphine  Physical Exam  General:  Well-developed,well-nourished; alert,appropriate and cooperative throughout examinationmild distress.   Neck:  - full ROM but pain at left side of neck with rotation and lateral bending to right  - no lymphadenopathy or thyromegaly or masses Lungs:  Normal respiratory effort, chest expands symmetrically. Lungs are clear to auscultation, no crackles or wheezes. Heart:  Normal rate and regular rhythm. S1 and S2 normal without gallop, murmur, click, rub or other extra sounds. Msk:  - bilateral trapzius tenderness, R >L  -  full ROM shoulders and elbows w/o pain  - 5/5 strength bilateral upepr extremities at shoulders, elbows, wrists, fingers  Neurologic:  sensation intact to light touch.     Impression & Recommendations:  Problem # 1:  NECK SPASM (ICD-847.0) Assessment New  Mild. Handout on neck stretching exercises, stress relief given. Advised continues use of heat and gentle massage. Tylenol / NSAID for pain. Given mild nature of symptoms do not feel need for muscle relaxant or trigger point injection at this time. Conservative therapy as above. Red flags that would prompt return to care were reviewed with patient and patient expressed understanding.   The following medications were removed from the medication list:    Diclofenac Sodium 75 Mg Tbec (Diclofenac sodium) .Marland Kitchen... Take one in the morning and one in the evening with food Her updated medication list for this problem includes:    Bayer Childrens Aspirin 81 Mg Chew (Aspirin) .Marland Kitchen... Take 1 tablet by mouth once a day  Orders: [facility]- Est Level  3 (78295)  Complete Medication List: 1)  Bayer Childrens Aspirin 81 Mg Chew (Aspirin) .... Take 1 tablet by mouth once a day 2)  Hydrochlorothiazide 25 Mg Tabs (Hydrochlorothiazide) .... Take 1 tab by mouth every morning  Patient Instructions: 1)  Read the instructions I have given regarding neck pain 2)  Use heat or massage to help with pain 3)  Follow up with your regular doctor as scheduled or sooner if  this pain becomes worse or if you have neck stiffness that does not get better    Orders Added: 1)  [facility]- Est Level  3 [40981]

## 2010-03-06 ENCOUNTER — Encounter: Payer: Self-pay | Admitting: *Deleted

## 2010-04-08 LAB — URINE MICROSCOPIC-ADD ON

## 2010-04-08 LAB — BASIC METABOLIC PANEL
BUN: 13 mg/dL (ref 6–23)
Calcium: 9.7 mg/dL (ref 8.4–10.5)
GFR calc non Af Amer: >60 mL/min (ref >60)
Potassium: 3.2 mEq/L — ABNORMAL LOW (ref 3.5–5.1)
Sodium: 142 mEq/L (ref 135–145)

## 2010-04-08 LAB — CBC
HCT: 37.4 % (ref 36.0–46.0)
Hemoglobin: 12.6 g/dL (ref 12.0–15.0)
RDW: 15.1 % (ref 11.5–15.5)
WBC: 9.7 10*3/uL (ref 4.0–10.5)

## 2010-04-08 LAB — URINALYSIS, ROUTINE W REFLEX MICROSCOPIC
Bilirubin Urine: NEGATIVE
Glucose, UA: NEGATIVE mg/dL
Ketones, ur: 15 mg/dL — AB
pH: 5 (ref 5.0–8.0)

## 2010-04-08 LAB — DIFFERENTIAL
Basophils Absolute: 0.1 10*3/uL (ref 0.0–0.1)
Lymphocytes Relative: 29 % (ref 12–46)
Monocytes Absolute: 0.5 10*3/uL (ref 0.1–1.0)
Neutro Abs: 6.2 10*3/uL (ref 1.7–7.7)
Neutrophils Relative %: 64 % (ref 43–77)

## 2010-04-08 LAB — POCT CARDIAC MARKERS

## 2010-04-08 LAB — POCT PREGNANCY, URINE: Preg Test, Ur: NEGATIVE

## 2010-04-08 LAB — D-DIMER, QUANTITATIVE: D-Dimer, Quant: 0.59 ug/mL-FEU — ABNORMAL HIGH (ref 0.00–0.48)

## 2010-05-24 ENCOUNTER — Other Ambulatory Visit: Payer: Self-pay | Admitting: Family Medicine

## 2010-05-24 DIAGNOSIS — I10 Essential (primary) hypertension: Secondary | ICD-10-CM

## 2010-05-25 ENCOUNTER — Telehealth: Payer: Self-pay | Admitting: Family Medicine

## 2010-05-25 NOTE — Telephone Encounter (Signed)
Complaining of cough x 2 weeks.  Cough is slightly productive and at times blood tinged.  Denies fever, fatigue or SOB on exertion.  Describes the cough as worse at night.  Has tried Mucinex with little relief.  Patient is non-smoker but does have a h/o asthma..  Told her that there was not much advice I could give w/o her being seen.  Gave her a [place] appt for tomorrow am.

## 2010-05-25 NOTE — Telephone Encounter (Signed)
Pt asking to speak with RN about a persistent cough she has had, offered an appt but pt would rather speak with RN.

## 2010-05-26 ENCOUNTER — Encounter: Payer: Self-pay | Admitting: Family Medicine

## 2010-05-26 ENCOUNTER — Ambulatory Visit (INDEPENDENT_AMBULATORY_CARE_PROVIDER_SITE_OTHER): Payer: Self-pay | Admitting: Family Medicine

## 2010-05-26 VITALS — BP 138/88 | HR 92 | Temp 98.3°F | Ht 64.75 in | Wt 185.0 lb

## 2010-05-26 DIAGNOSIS — R05 Cough: Secondary | ICD-10-CM | POA: Insufficient documentation

## 2010-05-26 DIAGNOSIS — R059 Cough, unspecified: Secondary | ICD-10-CM | POA: Insufficient documentation

## 2010-05-26 DIAGNOSIS — I10 Essential (primary) hypertension: Secondary | ICD-10-CM

## 2010-05-26 MED ORDER — FLUTICASONE PROPIONATE 50 MCG/ACT NA SUSP: 1.0000 | Freq: Every day | NASAL | Status: DC

## 2010-05-26 MED ORDER — BENZONATATE 100 MG PO CAPS: 100.0000 mg | ORAL_CAPSULE | Freq: Three times a day (TID) | ORAL | Status: AC | PRN

## 2010-05-26 MED ORDER — HYDROCHLOROTHIAZIDE 25 MG PO TABS: 25.0000 mg | ORAL_TABLET | Freq: Every day | ORAL | Status: DC

## 2010-05-26 NOTE — Patient Instructions (Addendum)
Your cough may be related to viral or environmental. Take generic brand of Zyrtec or Claritin, one tablet every morning.  Use the Flonase nasal spray every morning. Take Tessalon perles as needed for cough.  If still coughing in 2 wks please come back.

## 2010-05-26 NOTE — Progress Notes (Signed)
  Subjective:    Patient ID: Yvette Bell, female    DOB: 1955-08-26, 55 y.o.   MRN: [id number]  HPI Cough: Patient complains of nasal congestion, nonproductive cough, rhinorrhea  and wheezing.  Symptoms began 3 weeks ago.  The cough is non-productive, without wheezing, dyspnea or hemoptysis (but sometimes she has streaks of blood) and is aggravated by  being been around smoke, cough is worse in evening also. Associated symptoms include:change in voice. Patient does not have new pets. Patient does not have a history of asthma. Patient does have a history of environmental allergens (dust, grass). Patient does not have recent travel. Patient does have a remote history of smoking.  She smoked once in a while for a few yrs, but quit yrs ago. Patient  does not have previous Chest X-ray. Patient does not have had a PPD done. +watery eyes. Sick contacts: none Tried mucinex x1 time, did not help. Tried ventolin inhaler x1 time, maybe helped.   Review of Systems Per hpi     Objective:   Physical Exam  Constitutional: She is oriented to person, place, and time. She appears well-developed and well-nourished. No distress.  HENT:  Head: Normocephalic and atraumatic.  Nose: Mucosal edema present. Right sinus exhibits no maxillary sinus tenderness and no frontal sinus tenderness. Left sinus exhibits no maxillary sinus tenderness and no frontal sinus tenderness.  Mouth/Throat: Oropharynx is clear and moist. No oropharyngeal exudate.  Neck: Normal range of motion. Neck supple.  Cardiovascular: Normal rate, regular rhythm and normal heart sounds.   No murmur heard. Pulmonary/Chest: Effort normal and breath sounds normal. No respiratory distress. She has no wheezes. She has no rales. She exhibits no tenderness.  Musculoskeletal: She exhibits no edema.  Lymphadenopathy:    She has no cervical adenopathy.  Neurological: She is alert and oriented to person, place, and time.  Skin: She is not diaphoretic.          Assessment & Plan:

## 2010-05-26 NOTE — Assessment & Plan Note (Signed)
Cough x 3 weeks. Non productive, but small streaks of blood sometimes.  Likely viral or environmental etiology. Will treat with tessalon perles, zyrtec or claritin, and flonase. If not better in 2 wks, pt will return to clinic.  May need CXR at that time as pt has remote history of smoking socially (no weight loss, night sweats, fever, chills)

## 2010-09-19 ENCOUNTER — Other Ambulatory Visit: Payer: Self-pay | Admitting: Family Medicine

## 2010-09-19 NOTE — Telephone Encounter (Signed)
Refill request

## 2010-10-24 ENCOUNTER — Encounter: Payer: Self-pay | Admitting: Family Medicine

## 2010-10-24 ENCOUNTER — Ambulatory Visit (INDEPENDENT_AMBULATORY_CARE_PROVIDER_SITE_OTHER): Payer: Self-pay | Admitting: Family Medicine

## 2010-10-24 VITALS — BP 145/96 | HR 92 | Temp 99.3°F | Ht 64.75 in | Wt 182.3 lb

## 2010-10-24 DIAGNOSIS — M629 Disorder of muscle, unspecified: Secondary | ICD-10-CM

## 2010-10-24 DIAGNOSIS — M7630 Iliotibial band syndrome, unspecified leg: Secondary | ICD-10-CM

## 2010-10-24 MED ORDER — IBUPROFEN 800 MG PO TABS: 800.0000 mg | ORAL_TABLET | Freq: Three times a day (TID) | ORAL | Status: AC

## 2010-10-24 NOTE — Patient Instructions (Signed)
It was great to see you today! I want you to take 800mg  of Motrin three times daily.  Make sure not to take it on an empty stomach.

## 2010-10-24 NOTE — Progress Notes (Signed)
Subjective: Pain in right hip, occasional pain in left calf, and knee swelling.  Swelling in knees has been going on for about a month and does not seem to vary throughout the day. Left hip pain for about 3 weeks, primarily with long distance walking. Pain in left calf is only present with walking > 5 miles.  Never had problems with this before.  Pain is made worse by walking and better with rest.  No meds, occasional stretches.  No history of trauma at any time in the past.  Pain is not bad enough to interfere with daily activities.  Objective:  Filed Vitals:   10/24/10 1518  BP: 145/96  Pulse: 92  Temp: 99.3 F (37.4 C)   Gen: No acute distress, obese CV: Regular rate and rhythm Resp: Clear to auscultation bilaterally Abd: Obese, otherwise soft, nontender, nondistended Extremities: Very mild tenderness to palpation over the right greater trochanter. There is no erythema or warmth. Full range of motion, no decreased strength. The knees bilaterally are normal, with no decreased range of motion, no swelling, no evidence of effusion, and only mild crepitus. There is no pain on palpation. There are prominent fat pads in either side of the patellar tendon bilaterally. There is no obvious deformity of the left lower extremity. There is no pain on palpation. There are no palpable ropes. There is no obvious erythema or swelling.  Assessment/Plan: Feel that this most likely represents iliotibial band syndrome versus trochanteric bursitis. As this is a first episode, and the duration is one month or less, we'll treat conservatively with oral NSAIDs for 2 weeks and see if this will cause the problem to resolve. If it does not, will consider referral to sports medicine for an injection. Please also see individual problems in problem list for problem-specific plans.

## 2010-11-03 LAB — WET PREP, GENITAL: Trich, Wet Prep: NONE SEEN

## 2010-12-15 ENCOUNTER — Other Ambulatory Visit: Payer: Self-pay | Admitting: Family Medicine

## 2010-12-17 NOTE — Telephone Encounter (Signed)
Refill request

## 2011-03-23 ENCOUNTER — Other Ambulatory Visit: Payer: Self-pay | Admitting: Family Medicine

## 2011-03-23 NOTE — Telephone Encounter (Signed)
Refill request

## 2011-06-28 ENCOUNTER — Other Ambulatory Visit: Payer: Self-pay | Admitting: Family Medicine

## 2011-08-20 ENCOUNTER — Emergency Department (HOSPITAL_COMMUNITY)
Admission: EM | Admit: 2011-08-20 | Discharge: 2011-08-21 | Disposition: A | Discharge status: Home / Self Care | Payer: Self-pay | Attending: Emergency Medicine | Admitting: Emergency Medicine

## 2011-08-20 ENCOUNTER — Encounter (HOSPITAL_COMMUNITY): Payer: Self-pay | Admitting: Emergency Medicine

## 2011-08-20 DIAGNOSIS — F3289 Other specified depressive episodes: Secondary | ICD-10-CM | POA: Insufficient documentation

## 2011-08-20 DIAGNOSIS — F411 Generalized anxiety disorder: Secondary | ICD-10-CM | POA: Insufficient documentation

## 2011-08-20 DIAGNOSIS — I10 Essential (primary) hypertension: Secondary | ICD-10-CM | POA: Insufficient documentation

## 2011-08-20 DIAGNOSIS — F329 Major depressive disorder, single episode, unspecified: Secondary | ICD-10-CM

## 2011-08-20 DIAGNOSIS — R45851 Suicidal ideations: Secondary | ICD-10-CM | POA: Insufficient documentation

## 2011-08-20 DIAGNOSIS — Z7982 Long term (current) use of aspirin: Secondary | ICD-10-CM | POA: Insufficient documentation

## 2011-08-20 DIAGNOSIS — R51 Headache: Secondary | ICD-10-CM | POA: Insufficient documentation

## 2011-08-20 HISTORY — DX: Anxiety disorder, unspecified: F41.9

## 2011-08-20 HISTORY — DX: Major depressive disorder, single episode, unspecified: F32.9

## 2011-08-20 HISTORY — DX: Depression, unspecified: F32.A

## 2011-08-20 HISTORY — DX: Essential (primary) hypertension: I10

## 2011-08-20 LAB — COMPREHENSIVE METABOLIC PANEL
ALT: 15 U/L (ref 0–35)
AST: 16 U/L (ref 0–37)
Albumin: 4 g/dL (ref 3.5–5.2)
CO2: 30 mEq/L (ref 19–32)
Calcium: 10.1 mg/dL (ref 8.4–10.5)
Chloride: 103 mEq/L (ref 96–112)
GFR calc non Af Amer: >90 mL/min (ref >90)
Sodium: 142 mEq/L (ref 135–145)
Total Bilirubin: 0.3 mg/dL (ref 0.3–1.2)

## 2011-08-20 LAB — CBC
Platelets: 268 10*3/uL (ref 150–400)
RBC: 4.55 MIL/uL (ref 3.87–5.11)
RDW: 14.4 % (ref 11.5–15.5)
WBC: 10.9 10*3/uL — ABNORMAL HIGH (ref 4.0–10.5)

## 2011-08-20 LAB — RAPID URINE DRUG SCREEN, HOSP PERFORMED
Amphetamines: NOT DETECTED
Barbiturates: NOT DETECTED
Cocaine: NOT DETECTED
Tetrahydrocannabinol: NOT DETECTED

## 2011-08-20 MED ORDER — ACETAMINOPHEN 325 MG PO TABS
650.0000 mg | ORAL_TABLET | ORAL | Status: DC | PRN
  Administered 2011-08-21: 650 mg via ORAL
  Filled 2011-08-20: qty 2

## 2011-08-20 MED ORDER — LORAZEPAM 1 MG PO TABS: 1.0000 mg | ORAL_TABLET | Freq: Three times a day (TID) | ORAL | Status: DC | PRN

## 2011-08-20 MED ORDER — HYDROCHLOROTHIAZIDE 25 MG PO TABS
25.0000 mg | ORAL_TABLET | Freq: Every day | ORAL | Status: DC
  Administered 2011-08-20: 25 mg via ORAL
  Filled 2011-08-20 (×3): qty 1

## 2011-08-20 MED ORDER — ALUM & MAG HYDROXIDE-SIMETH 200-200-20 MG/5ML PO SUSP: 30.0000 mL | ORAL | Status: DC | PRN

## 2011-08-20 MED ORDER — ONDANSETRON HCL 4 MG PO TABS
4.0000 mg | ORAL_TABLET | Freq: Three times a day (TID) | ORAL | Status: DC | PRN
  Filled 2011-08-20: qty 1

## 2011-08-20 MED ORDER — ASPIRIN 81 MG PO CHEW
81.0000 mg | CHEWABLE_TABLET | Freq: Every day | ORAL | Status: DC
  Administered 2011-08-20: 81 mg via ORAL
  Filled 2011-08-20: qty 1

## 2011-08-20 MED ORDER — ZOLPIDEM TARTRATE 5 MG PO TABS: 5.0000 mg | ORAL_TABLET | Freq: Every evening | ORAL | Status: DC | PRN

## 2011-08-20 MED ORDER — ACETAMINOPHEN 325 MG PO TABS
650.0000 mg | ORAL_TABLET | Freq: Once | ORAL | Status: AC
  Administered 2011-08-20: 650 mg via ORAL
  Filled 2011-08-20: qty 2

## 2011-08-20 NOTE — ED Provider Notes (Signed)
Complains of feeling of general depression for several months worsening over the past 2 weeks. Patient reports feeling suicidal. Symptoms accompanied by left-sided headache gradual onset. No treatment prior to coming. On exam alert tearful Glasgow Coma Score 15 moves all extremity is well gait normal  Doug Sou, MD 08/20/11 1512

## 2011-08-20 NOTE — ED Notes (Signed)
Pt states she would like to leave now. ACT team notified.

## 2011-08-20 NOTE — ED Notes (Signed)
Pt states she has been depressed for around 5 years.  Says she felt suicidal with no plan.  Denies HI.  Upon further questioning, she stated she would not kill herself because it was a sin but is very depressed.

## 2011-08-20 NOTE — ED Provider Notes (Signed)
History     CSN: [id number]  Arrival date & time 08/20/11  1249   First MD Initiated Contact with Patient 08/20/11 1429      Chief Complaint  Patient presents with  . Medical Clearance    (Consider location/radiation/quality/duration/timing/severity/associated sxs/prior treatment) HPI History from patient. 56 year old female with past medical history of anxiety, depression, and hypertension presents for medical clearance. She presented to the ED via EMS. She reports that she was driving and stopped at a church to seek help. She reports that she has had a lot of life stressors lately and just started a new job. When asked about suicidal or homicidal ideation, she states "yes to both of those" but will not elaborate further. She states that she has been seen by behavioral health in the past and has been on medications previously but is not currently taking these. She reports that she's been feeling increasingly anxious over the past several months.  Patient also states that she has had a headache for the past 3-4 days. It is located near her left ear. It has not changed location since it began. No modifying or alleviating factors. No treatment prior to coming here. Pain is described as aching in nature. She has had slight associated nausea without vomiting. She denies any photophobia, phonophobia, nausea, visual change, neck pain or stiffness. No history of migraines previously.  Past Medical History  Diagnosis Date  . Anxiety   . Depression   . Hypertension     Past Surgical History  Procedure Date  . Cesearean section   . Hernia repair     No family history on file.  History  Substance Use Topics  . Smoking status: Never Smoker   . Smokeless tobacco: Not on file  . Alcohol Use: No    OB History    Grav Para Term Preterm Abortions TAB SAB Ect Mult Living                  Review of Systems  Constitutional: Negative for fever, chills, activity change and appetite  change.  HENT: Negative for congestion, sore throat, trouble swallowing, neck pain and neck stiffness.   Eyes: Negative for photophobia and visual disturbance.  Respiratory: Negative for cough, chest tightness and shortness of breath.   Cardiovascular: Negative for chest pain.  Gastrointestinal: Positive for nausea. Negative for vomiting and abdominal pain.  Skin: Negative for color change and rash.  Neurological: Positive for headaches. Negative for dizziness and weakness.    Allergies  Morphine  Home Medications   Current Outpatient Rx  Name Route Sig Dispense Refill  . ASPIRIN 81 MG PO CHEW Oral Chew 81 mg by mouth daily.      Marland Kitchen HYDROCHLOROTHIAZIDE 25 MG PO TABS  TAKE 1 TABLET BY MOUTH DAILY 90 tablet 0    BP 159/109  Pulse 81  Temp 99.3 F (37.4 C) (Oral)  Resp 21  SpO2 100%  LMP 08/20/2011  Physical Exam  Nursing note and vitals reviewed. Constitutional: She is oriented to person, place, and time. She appears well-developed and well-nourished. No distress.  HENT:  Head: Normocephalic and atraumatic.  Mouth/Throat: Oropharynx is clear and moist. No oropharyngeal exudate.       TMs clear, no mastoid tenderness or redness  Eyes: EOM are normal. Pupils are equal, round, and reactive to light.  Neck: Normal range of motion. Neck supple.  Cardiovascular: Normal rate, regular rhythm and normal heart sounds.   Pulmonary/Chest: Effort normal and breath sounds normal.  She exhibits no tenderness.  Abdominal: Soft. Bowel sounds are normal. There is no tenderness. There is no rebound and no guarding.  Musculoskeletal: Normal range of motion.  Lymphadenopathy:    She has no cervical adenopathy.  Neurological: She is alert and oriented to person, place, and time. No cranial nerve deficit. Coordination normal.  Skin: Skin is warm and dry. She is not diaphoretic.  Psychiatric: She has a normal mood and affect.    ED Course  Procedures (including critical care time)  Labs  Reviewed  CBC - Abnormal; Notable for the following:    WBC 10.9 (*)     All other components within normal limits  COMPREHENSIVE METABOLIC PANEL - Abnormal; Notable for the following:    Glucose, Bld 111 (*)     All other components within normal limits  ETHANOL  URINE RAPID DRUG SCREEN (HOSP PERFORMED)   No results found.   1. Depression       MDM  Patient presents for medical clearance. She has a history of depression and anxiety. Reports suicidal ideation. Reports headache as well which may be tension type - no tx prior to coming here - will tx with OTC meds. Mildly hypertensive, hx of same, likely also elevated 2/2 anxiety. Pt medically clear for ACT eval. D/W Toyka who will see.  Grant Fontana, PA-C 08/20/11 1551

## 2011-08-20 NOTE — ED Notes (Signed)
Offered patient lunch tray. Patient refused.

## 2011-08-20 NOTE — ED Notes (Signed)
Telepsych MD called RN after consult and reported that pt is very depressed but not a danger to herself. His recommendation is to discharge with referrals to OP [facility] and start Zoloft 100mg  PO daily. Awaiting arrival of faxed report to present to EDP. Pt aware.

## 2011-08-20 NOTE — ED Provider Notes (Signed)
Medical screening examination/treatment/procedure(s) were conducted as a shared visit with non-physician practitioner(s) and myself.  I personally evaluated the patient during the encounter  Doug Sou, MD 08/20/11 1724

## 2011-08-20 NOTE — ED Notes (Signed)
Pt presenting to ed via ptar pt driving and stopped at a church to seek help pt feeling anxious and depressed. Pt states she has a lot of life stressors and she just started a new job. Pt states she also has other stressors in her life that's causing her to be depressed. Pt denies SI/HI. Pt is very tearful at this time.

## 2011-08-21 MED ORDER — SERTRALINE HCL 100 MG PO TABS: 100.0000 mg | ORAL_TABLET | Freq: Every day | ORAL | Status: DC

## 2011-08-21 NOTE — ED Notes (Signed)
Pt continues to make phone calls to have someone pick her up. Pt also states that she has a rental car that is past due and that she is stressed about that. Pt complained of some mild abdominal cramping and nausea, but refused the PRN meds offered by Clinical research associate. She did take some ginger ale and saltine crackers.

## 2011-08-21 NOTE — ED Provider Notes (Signed)
Pt had tele-psych consult and has been cleared for d/c  Toy Baker, MD 08/21/11 0005

## 2011-08-21 NOTE — ED Notes (Signed)
Pt was not seen by ACT team this admission, clicking on the link below this entry was an error. Patient was seen by telepsych per MD order after pt asked to be discharged, and that consult resulted in a recommendation for OP follow-up and a med Rx.

## 2011-08-21 NOTE — ED Notes (Signed)
Pt was cleared for discharge but unable to reach anyone at home to come pick her up. States that she will try again later.

## 2011-10-01 ENCOUNTER — Other Ambulatory Visit: Payer: Self-pay | Admitting: Family Medicine

## 2012-01-06 ENCOUNTER — Emergency Department (HOSPITAL_COMMUNITY)
Admission: EM | Admit: 2012-01-06 | Discharge: 2012-01-06 | Disposition: A | Discharge status: Home / Self Care | Payer: Self-pay | Attending: Emergency Medicine | Admitting: Emergency Medicine

## 2012-01-06 ENCOUNTER — Encounter (HOSPITAL_COMMUNITY): Payer: Self-pay | Admitting: *Deleted

## 2012-01-06 DIAGNOSIS — M25559 Pain in unspecified hip: Secondary | ICD-10-CM | POA: Insufficient documentation

## 2012-01-06 DIAGNOSIS — M7918 Myalgia, other site: Secondary | ICD-10-CM

## 2012-01-06 DIAGNOSIS — M2550 Pain in unspecified joint: Secondary | ICD-10-CM | POA: Insufficient documentation

## 2012-01-06 DIAGNOSIS — M549 Dorsalgia, unspecified: Secondary | ICD-10-CM | POA: Insufficient documentation

## 2012-01-06 DIAGNOSIS — I10 Essential (primary) hypertension: Secondary | ICD-10-CM | POA: Insufficient documentation

## 2012-01-06 DIAGNOSIS — Z8659 Personal history of other mental and behavioral disorders: Secondary | ICD-10-CM | POA: Insufficient documentation

## 2012-01-06 DIAGNOSIS — Z8742 Personal history of other diseases of the female genital tract: Secondary | ICD-10-CM | POA: Insufficient documentation

## 2012-01-06 HISTORY — DX: Benign neoplasm of connective and other soft tissue, unspecified: D21.9

## 2012-01-06 MED ORDER — IBUPROFEN 800 MG PO TABS
800.0000 mg | ORAL_TABLET | Freq: Once | ORAL | Status: AC
  Administered 2012-01-06: 800 mg via ORAL
  Filled 2012-01-06: qty 1

## 2012-01-06 MED ORDER — TRAMADOL HCL 50 MG PO TABS: 50.0000 mg | ORAL_TABLET | Freq: Four times a day (QID) | ORAL | Status: DC | PRN

## 2012-01-06 NOTE — ED Provider Notes (Signed)
Medical screening examination/treatment/procedure(s) were performed by non-physician practitioner and as supervising physician I was immediately available for consultation/collaboration.  Lisvet Rasheed, MD 01/06/12 2344 

## 2012-01-06 NOTE — ED Notes (Signed)
Pt escorted to discharge window. Pt verbalized understanding discharge instructions. In no acute distress.  

## 2012-01-06 NOTE — ED Provider Notes (Signed)
History     CSN: [id number]  Arrival date & time 01/06/12  1359   First MD Initiated Contact with Patient 01/06/12 1711      Chief Complaint  Patient presents with  . Back Pain  . Hip Pain    (Consider location/radiation/quality/duration/timing/severity/associated sxs/prior treatment) HPI  Yvette Bell is a 56 y.o. female complaining of thoracic back pain s/p lifting heavy items x4 days ago pain is described as moderate 6/10, she has not taken any pain control medication. It is exacerbated by movement lifting. The pain has evolved to involve her hips and knees as well. Patient is able to ambulate with no issues.  Past Medical History  Diagnosis Date  . Anxiety   . Depression   . Hypertension   . Fibroid tumor     Past Surgical History  Procedure Date  . Cesearean section   . Hernia repair     History reviewed. No pertinent family history.  History  Substance Use Topics  . Smoking status: Never Smoker   . Smokeless tobacco: Not on file  . Alcohol Use: No    OB History    Grav Para Term Preterm Abortions TAB SAB Ect Mult Living                  Review of Systems  Constitutional: Negative for fever.  Respiratory: Negative for shortness of breath.   Cardiovascular: Negative for chest pain.  Gastrointestinal: Negative for nausea, vomiting, abdominal pain and diarrhea.  Musculoskeletal: Positive for back pain and arthralgias.  All other systems reviewed and are negative.    Allergies  Morphine  Home Medications   Current Outpatient Rx  Name  Route  Sig  Dispense  Refill  . HYDROCHLOROTHIAZIDE 25 MG PO TABS   Oral   Take 25 mg by mouth at bedtime.           BP 145/90  Pulse 80  Temp 98.3 F (36.8 C) (Oral)  Resp 20  SpO2 95%  LMP 08/20/2011  Physical Exam  Nursing note and vitals reviewed. Constitutional: She is oriented to person, place, and time. She appears well-developed and well-nourished. No distress.  HENT:  Head: Normocephalic.   Mouth/Throat: Oropharynx is clear and moist.  Eyes: Conjunctivae normal and EOM are normal. Pupils are equal, round, and reactive to light.  Neck: Normal range of motion.  Cardiovascular: Normal rate, regular rhythm and intact distal pulses.   Pulmonary/Chest: Effort normal and breath sounds normal. No stridor. No respiratory distress. She has no wheezes. She has no rales. She exhibits no tenderness.  Abdominal: Soft. Bowel sounds are normal. She exhibits no distension and no mass. There is no tenderness. There is no rebound and no guarding.  Musculoskeletal: Normal range of motion.       No tenderness to palpation or step-offs appreciated, mild tenderness to palpation of the paracervical musculature of the thoracic region.   Neurological: She is alert and oriented to person, place, and time.  Skin: Skin is warm.  Psychiatric: She has a normal mood and affect.    ED Course  Procedures (including critical care time)  Labs Reviewed - No data to display No results found.   1. Musculoskeletal pain       MDM  Uncomplicated musculoskeletal pain. Patient has been counseled on proper lifting techniques.   Pt verbalized understanding and agrees with care plan. Outpatient follow-up and return precautions given.    New Prescriptions   TRAMADOL (ULTRAM) 50 MG TABLET  Take 1 tablet (50 mg total) by mouth every 6 (six) hours as needed for pain.          Wynetta Emery, PA-C 01/06/12 1749

## 2012-01-06 NOTE — ED Notes (Signed)
Pt c/o mid-back pain states "I hurt my back lifting boxes at work." pt also reports "sometimes my hips hurt too." denies injury to hips.

## 2012-01-18 ENCOUNTER — Other Ambulatory Visit: Payer: Self-pay | Admitting: Family Medicine

## 2012-01-21 ENCOUNTER — Other Ambulatory Visit: Payer: Self-pay | Admitting: *Deleted

## 2012-01-21 MED ORDER — HYDROCHLOROTHIAZIDE 25 MG PO TABS: 25.0000 mg | ORAL_TABLET | Freq: Every day | ORAL | Status: DC

## 2012-06-22 ENCOUNTER — Other Ambulatory Visit: Payer: Self-pay | Admitting: Family Medicine

## 2013-05-11 ENCOUNTER — Encounter (INDEPENDENT_AMBULATORY_CARE_PROVIDER_SITE_OTHER): Payer: Self-pay | Admitting: Ophthalmology

## 2013-06-30 ENCOUNTER — Telehealth: Payer: Self-pay | Admitting: Family Medicine

## 2013-06-30 ENCOUNTER — Ambulatory Visit (INDEPENDENT_AMBULATORY_CARE_PROVIDER_SITE_OTHER): Payer: No Typology Code available for payment source | Admitting: Family Medicine

## 2013-06-30 ENCOUNTER — Ambulatory Visit (HOSPITAL_COMMUNITY)
Admission: RE | Admit: 2013-06-30 | Discharge: 2013-06-30 | Disposition: A | Discharge status: Home / Self Care | Payer: No Typology Code available for payment source | Source: Ambulatory Visit | Attending: Family Medicine | Admitting: Family Medicine

## 2013-06-30 ENCOUNTER — Encounter: Payer: Self-pay | Admitting: Family Medicine

## 2013-06-30 VITALS — BP 170/96 | HR 85 | Temp 98.4°F | Wt 185.0 lb

## 2013-06-30 DIAGNOSIS — R079 Chest pain, unspecified: Secondary | ICD-10-CM | POA: Insufficient documentation

## 2013-06-30 DIAGNOSIS — I10 Essential (primary) hypertension: Secondary | ICD-10-CM

## 2013-06-30 DIAGNOSIS — R011 Cardiac murmur, unspecified: Secondary | ICD-10-CM | POA: Insufficient documentation

## 2013-06-30 DIAGNOSIS — R21 Rash and other nonspecific skin eruption: Secondary | ICD-10-CM

## 2013-06-30 LAB — CMP AND LIVER
ALBUMIN: 4 g/dL (ref 3.5–5.2)
ALK PHOS: 97 U/L (ref 39–117)
ALT: 12 U/L (ref 0–35)
AST: 15 U/L (ref 0–37)
BILIRUBIN TOTAL: 0.3 mg/dL (ref 0.2–1.2)
BUN: 12 mg/dL (ref 6–23)
Bilirubin, Direct: <0.2 mg/dL (ref 0.0–0.3)
CO2: 26 mEq/L (ref 19–32)
Calcium: 10.1 mg/dL (ref 8.4–10.5)
Chloride: 102 mEq/L (ref 96–112)
Creat: 0.72 mg/dL (ref 0.50–1.10)
Glucose, Bld: 108 mg/dL — ABNORMAL HIGH (ref 70–99)
POTASSIUM: 3.8 meq/L (ref 3.5–5.3)
SODIUM: 142 meq/L (ref 135–145)
TOTAL PROTEIN: 7.7 g/dL (ref 6.0–8.3)

## 2013-06-30 LAB — CBC
HEMATOCRIT: 41.7 % (ref 36.0–46.0)
Hemoglobin: 13.7 g/dL (ref 12.0–15.0)
MCH: 29.2 pg (ref 26.0–34.0)
MCHC: 29.2 g/dL — ABNORMAL LOW (ref 30.0–36.0)
MCV: 88.9 fL (ref 78.0–100.0)
PLATELETS: 265 10*3/uL (ref 150–400)
RBC: 4.69 MIL/uL (ref 3.87–5.11)
RDW: 14 % (ref 11.5–15.5)
WBC: 8.9 10*3/uL (ref 4.0–10.5)

## 2013-06-30 LAB — TSH: TSH: 1.273 u[IU]/mL (ref 0.350–4.500)

## 2013-06-30 LAB — TROPONIN I: Troponin I: <0.01 ng/mL (ref <0.06)

## 2013-06-30 MED ORDER — HYDROCHLOROTHIAZIDE 25 MG PO TABS: 25.0000 mg | ORAL_TABLET | Freq: Every day | ORAL | Status: DC

## 2013-06-30 NOTE — Assessment & Plan Note (Signed)
3/6 systolic heart murmur heard best over the right second intercostal space -2-D echocardiogram ordered

## 2013-06-30 NOTE — Assessment & Plan Note (Signed)
Uncontrolled due to medication noncompliance. Improved on repeat check in office -Restart HCTZ 25 mg daily -Check CMP to evaluate for endorgan damage -Return in one week for recheck of blood pressure

## 2013-06-30 NOTE — Assessment & Plan Note (Signed)
Hyperpigmented macules consistent with postinflammatory hyperpigmentation. Suspect related to allergic dermatitis from a laundry detergent. -Patient to change much her detergent to previous hypoallergenic brand -Counseled to apply daily emollients in the setting of eczema -Return to office if symptoms do not resolve

## 2013-06-30 NOTE — Patient Instructions (Addendum)
Elevated blood pressure - please restart HCTZ 25 mg daily, a refill has been sent to your pharmacy  Chest Pain - EKG normal, Dr. Ree Kida would like to check some lab work and will call you with the results this afternoon  Heart Murmur - my nurse will schedule and ultrasound (echocardiogram) and will call you when it is scheduled  Rash - likely an allergic reaction to your laundry detergent, change your detergent, the dark area are the result of inflammation from scratching, start to apply daily lotion/cream (Eucerin or Cedafil) to decrease itching  Follow up next week for your physical and check of blood pressure.

## 2013-06-30 NOTE — Progress Notes (Signed)
   Subjective:    Patient ID: Yvette Bell, female    DOB: 1955-03-14, 58 y.o.   MRN: [id number]  HPI 58 year old female presents for evaluation of rash.  Rash- patient reports erythematous rash over upper and lower extremities, it is intermittently pruritic, she reports welts that occur intermittently over the past few months, she reports no new fragrances or colognes however does report starting a new laundry detergent prior to the onset of her symptoms, the red areas have subsequently become darker, no associated fevers or chills, she has not attempted any over-the-counter creams or lotions  Hypertension-patient previously on HCTZ however transition to lisinopril at a community clinic which she had no insurance, she reports noncompliance with taking her blood pressure medications over the past few months, she does report a few day history of chest pain, no relation to exertion, no associated shortness of breath, no syncope, no lightheadedness, no headache, no vision changes, she reports no alleviating or exacerbating factors  Social-nonsmoker   Review of Systems  Constitutional: Negative for fever, chills and fatigue.  Respiratory: Negative for choking and shortness of breath.   Cardiovascular: Positive for chest pain. Negative for leg swelling.  Gastrointestinal: Negative for nausea, vomiting and diarrhea.  Skin: Positive for rash.       Objective:   Physical Exam Vitals: Reviewed General: Pleasant African American female, no acute distress HEENT: Normocephalic, pupils are equal round and reactive to light, extraocular movements are intact no scleral icterus, moist mucous membranes, uvula midline, no pharyngeal erythema or exudate noted, neck was supple, no anterior posterior cervical lymphadenopathy Cardiac: Regular rate and rhythm, S1 and S2 present, 3/6 systolic murmur best appreciated over the right second intercostal space, no heaves or thrills, reproducible anterior chest wall  tenderness Respiratory: Clear to patient bilaterally, normal effort Extremities: No edema, 2+ radial pulses bilaterally Skin: Numerous hyperpigmented macules over the upper and lower extremities consistent with postinflammatory hyperpigmentation, some excoriation was present  EKG performed in office showed normal sinus rhythm without acute ischemic changes     Assessment & Plan:  Please see problem specific assessment and plan.

## 2013-06-30 NOTE — Assessment & Plan Note (Addendum)
Three-day history of atypical chest pain in the setting of uncontrolled hypertension. EKG in office was unremarkable. Physical exam identified reproducible chest pain. -Will order stat lab work including CMP, troponin, and CBC -Patient will be notified of results

## 2013-07-01 NOTE — Telephone Encounter (Signed)
Returned dr Viann Shove phone call

## 2013-07-01 NOTE — Telephone Encounter (Signed)
Discussed negative lab results with patient.

## 2013-07-06 ENCOUNTER — Other Ambulatory Visit (HOSPITAL_COMMUNITY)
Admission: RE | Admit: 2013-07-06 | Discharge: 2013-07-06 | Disposition: A | Discharge status: Home / Self Care | Payer: No Typology Code available for payment source | Source: Ambulatory Visit | Attending: Family Medicine | Admitting: Family Medicine

## 2013-07-06 ENCOUNTER — Encounter: Payer: Self-pay | Admitting: Family Medicine

## 2013-07-06 ENCOUNTER — Ambulatory Visit (INDEPENDENT_AMBULATORY_CARE_PROVIDER_SITE_OTHER): Payer: No Typology Code available for payment source | Admitting: Family Medicine

## 2013-07-06 VITALS — BP 148/96 | HR 96 | Ht 64.75 in | Wt 184.0 lb

## 2013-07-06 DIAGNOSIS — Z01419 Encounter for gynecological examination (general) (routine) without abnormal findings: Secondary | ICD-10-CM | POA: Insufficient documentation

## 2013-07-06 DIAGNOSIS — Z124 Encounter for screening for malignant neoplasm of cervix: Secondary | ICD-10-CM

## 2013-07-06 DIAGNOSIS — Z0000 Encounter for general adult medical examination without abnormal findings: Secondary | ICD-10-CM

## 2013-07-06 DIAGNOSIS — Z1151 Encounter for screening for human papillomavirus (HPV): Secondary | ICD-10-CM | POA: Insufficient documentation

## 2013-07-06 DIAGNOSIS — I10 Essential (primary) hypertension: Secondary | ICD-10-CM

## 2013-07-06 MED ORDER — AMLODIPINE BESYLATE 5 MG PO TABS
5.0000 mg | ORAL_TABLET | Freq: Every day | ORAL | Status: DC
Start: 2013-07-06 — End: 2014-06-17

## 2013-07-06 MED ORDER — ASPIRIN EC 81 MG PO TBEC: 81.0000 mg | DELAYED_RELEASE_TABLET | Freq: Every day | ORAL | Status: DC

## 2013-07-06 NOTE — Progress Notes (Signed)
Patient ID: Yvette Bell, female   DOB: [birth year], 58 y.o.   MRN: [id number]     SUBJECTIVE:  58 y.o. female for annual routine Pap and checkup. History of hypertension, currently not controlled with symptoms of intermittent headache. Current Outpatient Prescriptions  Medication Sig Dispense Refill  . hydrochlorothiazide (HYDRODIURIL) 25 MG tablet Take 1 tablet (25 mg total) by mouth at bedtime.  90 tablet  1  . naproxen (NAPROSYN) 500 MG tablet Take 500 mg by mouth 2 (two) times daily with a meal.      . traMADol (ULTRAM) 50 MG tablet Take 1 tablet (50 mg total) by mouth every 6 (six) hours as needed for pain.  15 tablet  0   No current facility-administered medications for this visit.   Allergies: Morphine  Patient's last menstrual period was 08/20/2011.  ROS:  Feeling well. No dyspnea or chest pain on exertion.  No abdominal pain, change in bowel habits, black or bloody stools.  No urinary tract symptoms. GYN ROS: no breast pain or new or enlarging lumps on self exam, no vaginal bleeding, no discharge or pelvic pain. No neurological complaints.  OBJECTIVE:  The patient appears well, alert, oriented x 3, in no distress. LMP 08/20/2011 ENT normal.  Neck supple. No adenopathy or thyromegaly. PERLA. Lungs are clear, good air entry, no wheezes, rhonchi or rales. S1 and S2 normal, no murmurs, regular rate and rhythm. Abdomen soft without tenderness, guarding, mass or organomegaly. Extremities show no edema, normal peripheral pulses. Neurological is normal, no focal findings.  BREAST EXAM: breasts appear normal, no suspicious masses, no skin or nipple changes or axillary nodes  PELVIC EXAM: VULVA: normal appearing vulva with no masses, tenderness or lesions, VAGINA: normal appearing vagina with normal color and discharge, no lesions, CERVIX: normal appearing cervix without discharge or lesions, UTERUS: no tenderness, mass felt, non-tender, ADNEXA: no masses, PAP: Pap smear done  today  Physical Exam  Constitutional: She is oriented to person, place, and time. She appears well-developed and well-nourished.  Eyes: Conjunctivae and EOM are normal. Pupils are equal, round, and reactive to light.  Neck: Normal range of motion. Neck supple. No thyromegaly present.  Cardiovascular: Normal rate and regular rhythm.   Murmur heard.  Systolic murmur is present with a grade of 2/6  Pulmonary/Chest: Effort normal and breath sounds normal. No respiratory distress. She has no wheezes.  Abdominal: Soft. She exhibits no distension. There is no tenderness.  Musculoskeletal: Normal range of motion.  Neurological: She is alert and oriented to person, place, and time.  Skin: Skin is warm and dry.  Psychiatric: She is withdrawn.    ASSESSMENT:  well woman with hypertension  PLAN:  mammogram pap smear additional lab tests per orders Will add amlodipine to HTN regimen Return in 1 week for management of blood pressure

## 2013-07-06 NOTE — Patient Instructions (Addendum)
Yvette Bell, it was a pleasure seeing you today. Today we did your annual visit. I will contact you regarding the results of your pap smear. Since you are having some headaches, I will add on another blood pressure medication called amlodipine. I am also going to prescribe you aspirin to take daily. I am ordering some labs. Please schedule an appointment to get your labs drawn tomorrow. This visit will not be with me, but with the lab. It should be a quick visit. Do not eat before coming, though.  Please schedule an appointment to see me in 4 weeks, or sooner if needed  If you have any questions or concerns, please do not hesitate to call the office at [phone].  Sincerely,  Cordelia Poche, MD

## 2013-07-06 NOTE — Assessment & Plan Note (Signed)
Will add amlodipine to regimen since patient has headaches. Will follow-up in 4 weeks

## 2013-07-07 ENCOUNTER — Other Ambulatory Visit (INDEPENDENT_AMBULATORY_CARE_PROVIDER_SITE_OTHER): Payer: No Typology Code available for payment source

## 2013-07-07 DIAGNOSIS — Z0000 Encounter for general adult medical examination without abnormal findings: Secondary | ICD-10-CM

## 2013-07-07 LAB — COMPREHENSIVE METABOLIC PANEL
ALBUMIN: 4.3 g/dL (ref 3.5–5.2)
ALT: 11 U/L (ref 0–35)
AST: 17 U/L (ref 0–37)
Alkaline Phosphatase: 83 U/L (ref 39–117)
BUN: 14 mg/dL (ref 6–23)
CALCIUM: 10 mg/dL (ref 8.4–10.5)
CHLORIDE: 102 meq/L (ref 96–112)
CO2: 26 meq/L (ref 19–32)
CREATININE: 0.76 mg/dL (ref 0.50–1.10)
GLUCOSE: 100 mg/dL — AB (ref 70–99)
POTASSIUM: 3.4 meq/L — AB (ref 3.5–5.3)
Sodium: 142 mEq/L (ref 135–145)
Total Bilirubin: 0.8 mg/dL (ref 0.2–1.2)
Total Protein: 7.3 g/dL (ref 6.0–8.3)

## 2013-07-07 LAB — CBC WITH DIFFERENTIAL/PLATELET
Basophils Absolute: 0 10*3/uL (ref 0.0–0.1)
Basophils Relative: 0 % (ref 0–1)
EOS PCT: 1 % (ref 0–5)
Eosinophils Absolute: 0.1 10*3/uL (ref 0.0–0.7)
HEMATOCRIT: 40.4 % (ref 36.0–46.0)
HEMOGLOBIN: 13.9 g/dL (ref 12.0–15.0)
LYMPHS ABS: 4.6 10*3/uL — AB (ref 0.7–4.0)
LYMPHS PCT: 48 % — AB (ref 12–46)
MCH: 29.1 pg (ref 26.0–34.0)
MCHC: 34.4 g/dL (ref 30.0–36.0)
MCV: 84.5 fL (ref 78.0–100.0)
MONO ABS: 0.5 10*3/uL (ref 0.1–1.0)
MONOS PCT: 5 % (ref 3–12)
NEUTROS ABS: 4.4 10*3/uL (ref 1.7–7.7)
Neutrophils Relative %: 46 % (ref 43–77)
Platelets: 300 10*3/uL (ref 150–400)
RBC: 4.78 MIL/uL (ref 3.87–5.11)
RDW: 14.3 % (ref 11.5–15.5)
WBC: 9.5 10*3/uL (ref 4.0–10.5)

## 2013-07-07 LAB — POCT GLYCOSYLATED HEMOGLOBIN (HGB A1C): HEMOGLOBIN A1C: 5.6

## 2013-07-07 LAB — LIPID PANEL
CHOL/HDL RATIO: 3.1 ratio
CHOLESTEROL: 175 mg/dL (ref 0–200)
HDL: 56 mg/dL (ref >39)
LDL Cholesterol: 100 mg/dL — ABNORMAL HIGH (ref 0–99)
Triglycerides: 95 mg/dL (ref <150)
VLDL: 19 mg/dL (ref 0–40)

## 2013-07-07 NOTE — Progress Notes (Signed)
CBC WITH DIFF,CMP,FLP AND A1C DONE TODAY Yvette Bell

## 2013-07-08 LAB — CYTOLOGY - PAP

## 2013-07-13 ENCOUNTER — Ambulatory Visit (HOSPITAL_COMMUNITY): Payer: No Typology Code available for payment source | Attending: Internal Medicine

## 2013-07-17 ENCOUNTER — Encounter: Payer: Self-pay | Admitting: Family Medicine

## 2013-08-04 ENCOUNTER — Telehealth: Payer: Self-pay | Admitting: *Deleted

## 2013-08-04 NOTE — Telephone Encounter (Signed)
LVM for patient to call back. ?

## 2013-08-04 NOTE — Telephone Encounter (Signed)
Message copied by Johny Shears on Tue Aug 04, 2013  9:39 AM ------      Message from: Yvette Bell A      Created: Mon Aug 03, 2013  7:24 PM       Please inform patient that Pap smear was normal. She can have her next pap smear in 3 years. If that is negative for both cytology and HPV, then she can get them done every 5 years. I have no significant concerns regarding the rest of her labs. ------

## 2013-08-05 NOTE — Telephone Encounter (Signed)
Attempted to call "message is not available"

## 2013-08-06 NOTE — Telephone Encounter (Signed)
Attempted to call patient again. Closing phone note due to numerous attempts. Please inform patient if she calls back

## 2013-11-10 ENCOUNTER — Encounter: Payer: Self-pay | Admitting: Family Medicine

## 2013-11-10 ENCOUNTER — Ambulatory Visit (INDEPENDENT_AMBULATORY_CARE_PROVIDER_SITE_OTHER): Payer: No Typology Code available for payment source | Admitting: Family Medicine

## 2013-11-10 VITALS — BP 130/101 | HR 103 | Temp 98.6°F | Ht 64.75 in | Wt 189.0 lb

## 2013-11-10 DIAGNOSIS — S60451A Superficial foreign body of left index finger, initial encounter: Secondary | ICD-10-CM

## 2013-11-10 DIAGNOSIS — M79601 Pain in right arm: Secondary | ICD-10-CM

## 2013-11-10 MED ORDER — CYCLOBENZAPRINE HCL 10 MG PO TABS: 10.0000 mg | ORAL_TABLET | Freq: Three times a day (TID) | ORAL | Status: DC | PRN

## 2013-11-10 MED ORDER — NAPROXEN 500 MG PO TABS: 500.0000 mg | ORAL_TABLET | Freq: Two times a day (BID) | ORAL | Status: DC

## 2013-11-10 NOTE — Progress Notes (Signed)
   Subjective:    Patient ID: Yvette Bell, female    DOB: [birth year], 58 y.o.   MRN: [id number]  Patient presents for a same day appointment.  HPI  RIGHT ARM PAIN: - Reports gradual onset pain in Right shoulder radiating down to arm (mostly localized to upper arm, without extension down to hand), states that on Saturday she was lifting chairs and boxes, helping to clean up at friend's baby shower. Denies any acute accident or injury. States she woke up Sunday with Right arm pain, gradually worsened since onset, pain is worse with lifting or moving, also worse at night, described 8/10 with "stiffness, spasm, cramping", pain intermittent lasting few hours at a time. Improved with hot shower / heat. Has not tried any medicines for this, no Tylenol or NSAIDs, does not like to take medications. Previously prescribed Naproxen, has some at home currently. - Denies any prior surgeries or injuries of Right upper ext - Denies any recent illnesses, joint swelling or redness, pain in other joints, numbness, weakness, or tingling  FOREIGN BODY / LEFT INDEX FINGER: - Reports that she was walking about 2 weeks ago and accidentally "stuck left index finger" on "some sort of bush with a long sharp leaf/thorn", since injury has had intermittent pain to any touch over small area to side of fingernail, otherwise if not using finger has no pain. States she can see small green area where injury occurred. Has not tried to remove anything from finger. - Denies any fevers/chills, redness or swelling  I have reviewed and updated the following as appropriate: allergies and current medications  Social Hx: - Former smoker  Review of Systems  See above HPI    Objective:   Physical Exam  BP 130/101  Pulse 103  Temp(Src) 98.6 F (37 C)  Ht 5' 4.75" (1.645 m)  Wt 189 lb (85.73 kg)  BMI 31.68 kg/m2  LMP 08/20/2011  Gen - well-appearing, discomfort due to pain, cooperative, NAD Neck - supple, non-tender MSK /  Ext - Right Upper Ext: tenderness to palpation localized over anterior aspect of deltoid and biceps with some notable hypertonicity, otherwise posterior shoulder and trapezius non-tender and rest of RUE elbow to hand non-tender. No significant edema. Active ROM R-shoulder limited in forward flexion and abduction due to pain, rotator cuff strength testing normal 5/5 and labral testing intact. Left Index/2nd Finger - no significant edema, no erythema, mild tenderness to palpation over lateral aspect of lower cuticle with small < 1 cm green debris appears to be underneath skin without any open ulceration or puncture Neuro - awake, alert, grossly non-focal, intact muscle strength 5/5 b/l, intact distal sensation to light touch    Assessment & Plan:   See specific A&P problem list for details.

## 2013-11-10 NOTE — Patient Instructions (Signed)
Dear Yvette Bell, Thank you for coming in to clinic today  1. For your Right Arm - it sounds like you have strained a muscle. This will gradually improve over time, may take a few weeks. - Recommend the following: - take Flexeril muscle relaxant - you may cut these tablets in half, try 5mg  up to 3 times daily as needed or may take whole tablet up to 3 times daily - Take Naproxen 500mg  twice daily for 1-2 weeks, (take with meal) - Continue moist heat and showers, stretching and range of motion exercises 2. For your Left Finger - recommend soaking in warm soapy water for 10-15 minutes at a time 4 times daily for 5-7 days or until you get relief. If you see the thorn you may use tweezers, but avoid cutting or puncturing the skin.  Please schedule a follow-up appointment with Dr. Lonny Prude in 2-4 weeks to follow-up if symptoms not improving or worsening.  If you have any other questions or concerns, please feel free to call the clinic to contact me. You may also schedule an earlier appointment if necessary.  However, if your symptoms get significantly worse, please go to the Emergency Department to seek immediate medical attention.  [name], [place]

## 2013-11-10 NOTE — Assessment & Plan Note (Signed)
Consistent with small < 1 cm green plant foreign body within left index finger cuticle - No sign of infection, mild tenderness to palpation  Plan: 1. Recommend warm soapy water finger soaks up to 3-4 x daily for 5-7 days, anticipate to self resolve, currently with >2 week duration foreign body is not easily accessible 2. If persistent pain, no improvement, or return criteria for infection, advised to return for re-evaluation and possible foreign body removal, may go to Urgent Care or ED as well

## 2013-11-10 NOTE — Assessment & Plan Note (Signed)
Right upper arm pain without history of acute injury, gradual onset following lifting, suggestive of muscle strain consistent with reassuring exam, mostly limited ROM due to pain, otherwise intact rotator cuff, no weakness. - Consistent with under treated pain, no analgesics taken. Reassuring improved with heat  Plan: 1. Rx for Flexeril 10mg  tabs (may cut to 5mg ) up to TID PRN pain / spasm up to 2 weeks 2. Refilled Naproxen 500mg  BID PRN pain with meals, advised to take regularly x 1-2 weeks, may also take Tylenol PRN 3. Continue moist heat and showers, stretching and range of motion exercises 4. RTC for re-evaluation if persistent pain or worsening within 2 to 4 weeks

## 2014-01-17 ENCOUNTER — Encounter (HOSPITAL_COMMUNITY): Payer: Self-pay | Admitting: Emergency Medicine

## 2014-01-17 ENCOUNTER — Emergency Department (HOSPITAL_COMMUNITY)
Admission: EM | Admit: 2014-01-17 | Discharge: 2014-01-18 | Disposition: A | Discharge status: Home / Self Care | Payer: No Typology Code available for payment source | Attending: Emergency Medicine | Admitting: Emergency Medicine

## 2014-01-17 DIAGNOSIS — Z79899 Other long term (current) drug therapy: Secondary | ICD-10-CM | POA: Insufficient documentation

## 2014-01-17 DIAGNOSIS — Y929 Unspecified place or not applicable: Secondary | ICD-10-CM | POA: Insufficient documentation

## 2014-01-17 DIAGNOSIS — Z87448 Personal history of other diseases of urinary system: Secondary | ICD-10-CM | POA: Insufficient documentation

## 2014-01-17 DIAGNOSIS — Z791 Long term (current) use of non-steroidal anti-inflammatories (NSAID): Secondary | ICD-10-CM | POA: Diagnosis not present

## 2014-01-17 DIAGNOSIS — W19XXXA Unspecified fall, initial encounter: Secondary | ICD-10-CM

## 2014-01-17 DIAGNOSIS — Y998 Other external cause status: Secondary | ICD-10-CM | POA: Diagnosis not present

## 2014-01-17 DIAGNOSIS — S3992XA Unspecified injury of lower back, initial encounter: Secondary | ICD-10-CM | POA: Diagnosis not present

## 2014-01-17 DIAGNOSIS — I10 Essential (primary) hypertension: Secondary | ICD-10-CM | POA: Diagnosis not present

## 2014-01-17 DIAGNOSIS — Y9389 Activity, other specified: Secondary | ICD-10-CM | POA: Diagnosis not present

## 2014-01-17 DIAGNOSIS — Z8659 Personal history of other mental and behavioral disorders: Secondary | ICD-10-CM | POA: Diagnosis not present

## 2014-01-17 DIAGNOSIS — Z87891 Personal history of nicotine dependence: Secondary | ICD-10-CM | POA: Diagnosis not present

## 2014-01-17 DIAGNOSIS — W010XXA Fall on same level from slipping, tripping and stumbling without subsequent striking against object, initial encounter: Secondary | ICD-10-CM | POA: Diagnosis not present

## 2014-01-17 DIAGNOSIS — Z7982 Long term (current) use of aspirin: Secondary | ICD-10-CM | POA: Insufficient documentation

## 2014-01-17 NOTE — ED Notes (Signed)
Pt reports that she was at [place] yesterday at 1800, slipped in vomit on the floor, and fell. Pt reports bilateral hip pain after fall, stating she fell to his R side. Pt ambulatory to triage without difficulty.

## 2014-01-17 NOTE — ED Provider Notes (Signed)
CSN: [id number]     Arrival date & time 01/17/14  2316 History  This chart was scribed for non-physician practitioner Charlann Lange, PA-C working with Wynetta Fines, MD by Hilda Lias, ED Scribe. This patient was seen in room [facility] and the patient's care was started at 12:04 AM.    Chief Complaint  Patient presents with  . Fall    The history is provided by the patient. No language interpreter was used.     HPI Comments: Yvette Bell is a 58 y.o. female who presents to the Emergency Department complaining of worsening bilateral hip pain with associated lower back pain that occurred yesterday evening after she fell while walking down the aisle in a store. Pt states that her right leg slipped and moved laterally to her right side, and her left leg went behind her. Pt reports injuring her lower back before from a fall. Pt states that she has not tried any medications or treatments for her pain. Pt denies chest pain and abdominal pain.     Past Medical History  Diagnosis Date  . Anxiety   . Depression   . Hypertension   . Fibroid tumor    Past Surgical History  Procedure Laterality Date  . Cesearean section    . Hernia repair     History reviewed. No pertinent family history. History  Substance Use Topics  . Smoking status: Former Research scientist (life sciences)  . Smokeless tobacco: Not on file  . Alcohol Use: No   OB History    No data available     Review of Systems  Cardiovascular: Negative for chest pain.  Gastrointestinal: Negative for abdominal pain.  Musculoskeletal: Positive for back pain and arthralgias.      Allergies  Morphine  Home Medications   Prior to Admission medications   Medication Sig Start Date End Date Taking? Authorizing Provider  amLODipine (NORVASC) 5 MG tablet Take 1 tablet (5 mg total) by mouth daily. 07/06/13   Cordelia Poche, MD  aspirin EC 81 MG tablet Take 1 tablet (81 mg total) by mouth daily. 07/06/13   Cordelia Poche, MD  cyclobenzaprine (FLEXERIL) 10 MG  tablet Take 1 tablet (10 mg total) by mouth 3 (three) times daily as needed for muscle spasms. 11/10/13   [name], DO  hydrochlorothiazide (HYDRODIURIL) 25 MG tablet Take 1 tablet (25 mg total) by mouth at bedtime. 06/30/13   Lupita Dawn, MD  naproxen (NAPROSYN) 500 MG tablet Take 1 tablet (500 mg total) by mouth 2 (two) times daily with a meal. 11/10/13   Alexander Parks Ranger, DO   BP 164/97 mmHg  Pulse 89  Temp(Src) 98.2 F (36.8 C) (Oral)  Resp 16  SpO2 99%  LMP 08/20/2011 Physical Exam  Constitutional: She is oriented to person, place, and time. She appears well-developed and well-nourished.  HENT:  Head: Atraumatic.  Neck: Normal range of motion.  Pulmonary/Chest: Effort normal. She exhibits no tenderness.  Abdominal: Soft. There is no tenderness.  Musculoskeletal:  She has tenderness to bilateral lower back areas without focal midline tenderness. Right hip tenderness to palpation that is mild. Tenderness extends to lateral thigh. FROM lower extremities and she is fully weight bearing. Knee stable and without pain on motion. Calf nontender.   Neurological: She is alert and oriented to person, place, and time.  Skin: Skin is warm and dry.    ED Course  Procedures (including critical care time)  DIAGNOSTIC STUDIES: Oxygen Saturation is 99% on RA, normal by my interpretation.  COORDINATION OF CARE: 12:09 AM Discussed treatment plan with pt at bedside and pt agreed to plan.   Labs Review Labs Reviewed - No data to display  Imaging Review No results found.   EKG Interpretation None      MDM   Final diagnoses:  None    1. Fall 2. Low back pain 3. Muscular strain  She describes pain and soreness that is worse over time. There are no focal areas of concern for bony injury and she is ambulatory with full weight being. Suspect muscular strain injuries only. Supportive management.   I personally performed the services described in this  documentation, which was scribed in my presence. The recorded information has been reviewed and is accurate.     Dewaine Oats, PA-C 01/18/14 [place], MD 01/18/14 [phone]

## 2014-01-18 MED ORDER — IBUPROFEN 800 MG PO TABS: 800.0000 mg | ORAL_TABLET | Freq: Three times a day (TID) | ORAL | Status: DC

## 2014-01-18 MED ORDER — HYDROCODONE-ACETAMINOPHEN 5-325 MG PO TABS: 1.0000 | ORAL_TABLET | ORAL | Status: DC | PRN

## 2014-01-18 MED ORDER — IBUPROFEN 800 MG PO TABS
800.0000 mg | ORAL_TABLET | Freq: Once | ORAL | Status: AC
  Administered 2014-01-18: 800 mg via ORAL
  Filled 2014-01-18: qty 1

## 2014-01-18 MED ORDER — HYDROCODONE-ACETAMINOPHEN 5-325 MG PO TABS
1.0000 | ORAL_TABLET | Freq: Once | ORAL | Status: AC
  Administered 2014-01-18: 1 via ORAL
  Filled 2014-01-18: qty 1

## 2014-01-18 NOTE — Discharge Instructions (Signed)
Muscle Strain °A muscle strain is an injury that occurs when a muscle is stretched beyond its normal length. Usually a small number of muscle fibers are torn when this happens. Muscle strain is rated in degrees. First-degree strains have the least amount of muscle fiber tearing and pain. Second-degree and third-degree strains have increasingly more tearing and pain.  °Usually, recovery from muscle strain takes 1-2 weeks. Complete healing takes 5-6 weeks.  °CAUSES  °Muscle strain happens when a sudden, violent force placed on a muscle stretches it too far. This may occur with lifting, sports, or a fall.  °RISK FACTORS °Muscle strain is especially common in athletes.  °SIGNS AND SYMPTOMS °At the site of the muscle strain, there may be: °· Pain. °· Bruising. °· Swelling. °· Difficulty using the muscle due to pain or lack of normal function. °DIAGNOSIS  °Your health care provider will perform a physical exam and ask about your medical history. °TREATMENT  °Often, the best treatment for a muscle strain is resting, icing, and applying cold compresses to the injured area.   °HOME CARE INSTRUCTIONS  °· Use the PRICE method of treatment to promote muscle healing during the first 2-3 days after your injury. The PRICE method involves: °¨ Protecting the muscle from being injured again. °¨ Restricting your activity and resting the injured body part. °¨ Icing your injury. To do this, put ice in a plastic bag. Place a towel between your skin and the bag. Then, apply the ice and leave it on from 15-20 minutes each hour. After the third day, switch to moist heat packs. °¨ Apply compression to the injured area with a splint or elastic bandage. Be careful not to wrap it too tightly. This may interfere with blood circulation or increase swelling. °¨ Elevate the injured body part above the level of your heart as often as you can. °· Only take over-the-counter or prescription medicines for pain, discomfort, or fever as directed by your  health care provider. °· Warming up prior to exercise helps to prevent future muscle strains. °SEEK MEDICAL CARE IF:  °· You have increasing pain or swelling in the injured area. °· You have numbness, tingling, or a significant loss of strength in the injured area. °MAKE SURE YOU:  °· Understand these instructions. °· Will watch your condition. °· Will get help right away if you are not doing well or get worse. °Document Released: 01/08/2005 Document Revised: 10/29/2012 Document Reviewed: 08/07/2012 °[employer]® Patient Information ©2015 [employer]. This information is not intended to replace advice given to you by your health care provider. Make sure you discuss any questions you have with your health care provider. °Cryotherapy °Cryotherapy means treatment with cold. Ice or gel packs can be used to reduce both pain and swelling. Ice is the most helpful within the first 24 to 48 hours after an injury or flare-up from overusing a muscle or joint. Sprains, strains, spasms, burning pain, shooting pain, and aches can all be eased with ice. Ice can also be used when recovering from surgery. Ice is effective, has very few side effects, and is safe for most people to use. °PRECAUTIONS  °Ice is not a safe treatment option for people with: °· Raynaud phenomenon. This is a condition affecting small blood vessels in the extremities. Exposure to cold may cause your problems to return. °· Cold hypersensitivity. There are many forms of cold hypersensitivity, including: °· Cold urticaria. Red, itchy hives appear on the skin when the tissues begin to warm after being   iced. °· Cold erythema. This is a red, itchy rash caused by exposure to cold. °· Cold hemoglobinuria. Red blood cells break down when the tissues begin to warm after being iced. The hemoglobin that carry oxygen are passed into the urine because they cannot combine with blood proteins fast enough. °· Numbness or altered sensitivity in the area being iced. °If you have  any of the following conditions, do not use ice until you have discussed cryotherapy with your caregiver: °· Heart conditions, such as arrhythmia, angina, or chronic heart disease. °· High blood pressure. °· Healing wounds or open skin in the area being iced. °· Current infections. °· Rheumatoid arthritis. °· Poor circulation. °· Diabetes. °Ice slows the blood flow in the region it is applied. This is beneficial when trying to stop inflamed tissues from spreading irritating chemicals to surrounding tissues. However, if you expose your skin to cold temperatures for too long or without the proper protection, you can damage your skin or nerves. Watch for signs of skin damage due to cold. °HOME CARE INSTRUCTIONS °Follow these tips to use ice and cold packs safely. °· Place a dry or damp towel between the ice and skin. A damp towel will cool the skin more quickly, so you may need to shorten the time that the ice is used. °· For a more rapid response, add gentle compression to the ice. °· Ice for no more than 10 to 20 minutes at a time. The bonier the area you are icing, the less time it will take to get the benefits of ice. °· Check your skin after 5 minutes to make sure there are no signs of a poor response to cold or skin damage. °· Rest 20 minutes or more between uses. °· Once your skin is numb, you can end your treatment. You can test numbness by very lightly touching your skin. The touch should be so light that you do not see the skin dimple from the pressure of your fingertip. When using ice, most people will feel these normal sensations in this order: cold, burning, aching, and numbness. °· Do not use ice on someone who cannot communicate their responses to pain, such as small children or people with dementia. °HOW TO MAKE AN ICE PACK °Ice packs are the most common way to use ice therapy. Other methods include ice massage, ice baths, and cryosprays. Muscle creams that cause a cold, tingly feeling do not offer the  same benefits that ice offers and should not be used as a substitute unless recommended by your caregiver. °To make an ice pack, do one of the following: °· Place crushed ice or a bag of frozen vegetables in a sealable plastic bag. Squeeze out the excess air. Place this bag inside another plastic bag. Slide the bag into a pillowcase or place a damp towel between your skin and the bag. °· Mix 3 parts water with 1 part rubbing alcohol. Freeze the mixture in a sealable plastic bag. When you remove the mixture from the freezer, it will be slushy. Squeeze out the excess air. Place this bag inside another plastic bag. Slide the bag into a pillowcase or place a damp towel between your skin and the bag. °SEEK MEDICAL CARE IF: °· You develop white spots on your skin. This may give the skin a blotchy (mottled) appearance. °· Your skin turns blue or pale. °· Your skin becomes waxy or hard. °· Your swelling gets worse. °MAKE SURE YOU:  °· Understand these instructions. °·   Will watch your condition.  Will get help right away if you are not doing well or get worse. Document Released: 09/04/2010 Document Revised: 05/25/2013 Document Reviewed: 09/04/2010 [facility] Patient Information 2015 [place], [place]. This information is not intended to replace advice given to you by your health care provider. Make sure you discuss any questions you have with your health care provider.

## 2014-02-17 ENCOUNTER — Ambulatory Visit (INDEPENDENT_AMBULATORY_CARE_PROVIDER_SITE_OTHER): Payer: Self-pay | Admitting: Family Medicine

## 2014-02-17 VITALS — BP 171/105 | HR 80 | Temp 98.5°F | Wt 197.3 lb

## 2014-02-17 DIAGNOSIS — F418 Other specified anxiety disorders: Secondary | ICD-10-CM

## 2014-02-17 DIAGNOSIS — F5105 Insomnia due to other mental disorder: Secondary | ICD-10-CM

## 2014-02-17 DIAGNOSIS — F341 Dysthymic disorder: Secondary | ICD-10-CM

## 2014-02-17 DIAGNOSIS — I10 Essential (primary) hypertension: Secondary | ICD-10-CM

## 2014-02-17 DIAGNOSIS — E049 Nontoxic goiter, unspecified: Secondary | ICD-10-CM | POA: Insufficient documentation

## 2014-02-17 HISTORY — DX: Insomnia due to other mental disorder: F51.05

## 2014-02-17 HISTORY — DX: Other specified anxiety disorders: F41.8

## 2014-02-17 MED ORDER — TRAZODONE HCL 50 MG PO TABS
25.0000 mg | ORAL_TABLET | Freq: Every day | ORAL | Status: DC
Start: 2014-02-17 — End: 2015-01-21

## 2014-02-17 MED ORDER — HYDROCHLOROTHIAZIDE 25 MG PO TABS: 25.0000 mg | ORAL_TABLET | Freq: Every day | ORAL | Status: DC

## 2014-02-17 NOTE — Progress Notes (Signed)
Subjective: Yvette Bell is a 59 y.o. female with a history of HTN, depression and anxiety here for sleep problems  She struggles more with waking up after 3 hours than getting to sleep. She admits she has no bedtime routine and in fact has very little constancy in her life in general. She is on her laptop until about midnight-1am and goes to bed. Frequently has violent dreams and usually wakes up between 4-5am and watches TV or gets back on the laptop until she gets tired again. She goes back to sleep and wakes up around 10am. This has been a problem for several months. She has tried no medicines and in fact she very much dislikes taking any medicine at all. She attributes this and recent depressed mood to menopause. Last vasomotor symptoms were 5 years ago. She denies snoring and has never had a sleep study.   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 She has been staying in her Daughter's home and helping take care of her 3 grandsons.   She denies a precipitating event or traumatic event. She feels safe in her home currently. No history nor current use of substances. Not smoking anymore and not drinking. No FH of alcoholism. She has a history of hypothyroidism and was formerly on synthroid, though this was discontinued many years ago. Denies heat/cold intolerance, skin and hair texture changes. Further denies uninhibited behavior, inappropriate libido/spending habits or increased activity.   She stopped taking her blood pressure medications about a month ago because they made her urinate too much. She denies headache, vision changes, SOB, chest pain, palpitations (except during anxious moods). Further denies orthopnea and PND, and endorses some leg swelling.   Sleep disturbances  x  Interest loss  x  Guilt  -   Energy  x  Cognition/Concentration Problems   x  Appetite Change  x  Psychomotor Agitation  -  Suicide  -   - Review of Systems: Per HPI.  Objective: BP 171/105 mmHg  Pulse 80  Temp(Src) 98.5 F (36.9 C) (Oral)  Wt 197 lb 4.8 oz (89.495 kg)  LMP 08/20/2011  Gen: Somber 59 y.o. female in NAD Neck: Painless thyroid enlargement without palpable cyst/nodule.  Neuro: Alert and oriented, speech normal, no focal deficits.  Psych: She is neatly groomed wearing a shirt with multiple stains. Maintains good eye contact and is cooperative and attentive. Speech is normal in tone, rate and rhythm. Mood is depressed with a full affect. Thought process is logical and goal directed. Denied suicidal or homicidal ideation. Does not appear to be responding to any internal stimuli. Able to maintain train of thought and concentrate on the questions.    Wt Readings from Last 3 Encounters:  02/17/14 197 lb 4.8 oz (89.495 kg)  11/10/13 189 lb (85.73 kg)  07/06/13 184 lb (83.462 kg)   Lab Results  Component Value Date   TSH 1.273 06/30/2013   Assessment & Plan: Yvette Bell is a 59 y.o. 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   - Start trazodone 25mg , increase to 50mg  as needed.  - Continue counseling sessions - Walk everyday  - Take blood pressure medications (refilled HCTZ today) - Check TSH (enlarged goiter on exam, history of hypothyroidism) - Follow up in 2 weeks - Discuss social work  consult at next office visit.

## 2014-02-17 NOTE — Assessment & Plan Note (Signed)
Check TSH as she reports it has enlarged recently and has a history of medically treated hypothyroidism and depressed mood with significant fatigue. Last TSH 6 months ago was normal.

## 2014-02-17 NOTE — Assessment & Plan Note (Signed)
She quite dislikes the thought of taking medicine. When discussing options she began tearing up because of this. She is also guilt stricken about her recent weight gain (though this is likely mostly/entirely water weight from not taking diuretic for a month). Remeron would likely exacerbate this and other sedative hypnotics have too many side effects for her. She does agree with trazodone at a very low dose. Will not take anything for depression.  - Start trazodone 25mg , increase to 50mg  as needed.  - Continue counseling sessions - Walk everyday  - Follow up in 2 weeks - Discuss social work consult at next office visit.

## 2014-02-17 NOTE — Assessment & Plan Note (Addendum)
Uncontrolled into stage II range today after being off medications for nearly a month. Asymptomatic. Considered substituting ACE for HCTZ (as polyuria is primary reason for nonadherence) but did not explore this possibility due to time limitations. Will follow up on this at next office visit.  - Refilled HCTZ - Discussed primacy of exercise to mood improvement, weight loss and BP improvement.  - Discussed diet, though she reports very low salt intake.

## 2014-02-17 NOTE — Patient Instructions (Signed)
It was nice to meet you!  I think your insomnia is due to depression and should be well treated with trazodone as we discussed.  - We will start at 25mg  (1/2 tablet) at night and you may increase this to 50mg  (whole tablet) as needed. - Sleep hygiene is also very important. Make sure you have a ritual.  - Your blood pressure weight gain is likely due to water weight gained from not taking the blood pressure medicines. Please start taking these again.  - We are checking your thyroid. We will let you know the results when we receive them.  - Continue going to the counselor. This is a great treatment for you.  - Follow up in 2 weeks to discuss how this medicine is working for you.   As we discussed, I think walking every day would dramatically improve how you feel and improve your medical conditions. Your blood pressure would be better and maybe you could come off the medicine. Your mood would improve and sleep would come easier.   Remember: Yvette Bell looking for progress, not perfection. Be kind to yourself.  Take care,  - Vance Gather, MD

## 2014-02-18 ENCOUNTER — Encounter: Payer: Self-pay | Admitting: Family Medicine

## 2014-02-18 LAB — TSH: TSH: 1.061 u[IU]/mL (ref 0.350–4.500)

## 2014-05-23 ENCOUNTER — Encounter (HOSPITAL_COMMUNITY): Payer: Self-pay

## 2014-05-23 ENCOUNTER — Emergency Department (HOSPITAL_COMMUNITY)
Admission: EM | Admit: 2014-05-23 | Discharge: 2014-05-23 | Disposition: A | Discharge status: Home / Self Care | Payer: No Typology Code available for payment source | Attending: Emergency Medicine | Admitting: Emergency Medicine

## 2014-05-23 DIAGNOSIS — F329 Major depressive disorder, single episode, unspecified: Secondary | ICD-10-CM | POA: Insufficient documentation

## 2014-05-23 DIAGNOSIS — Z791 Long term (current) use of non-steroidal anti-inflammatories (NSAID): Secondary | ICD-10-CM | POA: Insufficient documentation

## 2014-05-23 DIAGNOSIS — F419 Anxiety disorder, unspecified: Secondary | ICD-10-CM | POA: Insufficient documentation

## 2014-05-23 DIAGNOSIS — I10 Essential (primary) hypertension: Secondary | ICD-10-CM | POA: Insufficient documentation

## 2014-05-23 DIAGNOSIS — E876 Hypokalemia: Secondary | ICD-10-CM | POA: Insufficient documentation

## 2014-05-23 DIAGNOSIS — K625 Hemorrhage of anus and rectum: Secondary | ICD-10-CM | POA: Insufficient documentation

## 2014-05-23 DIAGNOSIS — Z79899 Other long term (current) drug therapy: Secondary | ICD-10-CM | POA: Insufficient documentation

## 2014-05-23 DIAGNOSIS — Z86018 Personal history of other benign neoplasm: Secondary | ICD-10-CM | POA: Insufficient documentation

## 2014-05-23 DIAGNOSIS — Z7982 Long term (current) use of aspirin: Secondary | ICD-10-CM | POA: Insufficient documentation

## 2014-05-23 DIAGNOSIS — Z87891 Personal history of nicotine dependence: Secondary | ICD-10-CM | POA: Insufficient documentation

## 2014-05-23 LAB — BASIC METABOLIC PANEL
Anion gap: 6 (ref 5–15)
BUN: 14 mg/dL (ref 6–20)
CALCIUM: 9.5 mg/dL (ref 8.9–10.3)
CO2: 28 mmol/L (ref 22–32)
CREATININE: 0.64 mg/dL (ref 0.44–1.00)
Chloride: 106 mmol/L (ref 101–111)
GFR calc Af Amer: >60 mL/min (ref >60)
GLUCOSE: 122 mg/dL — AB (ref 70–99)
Potassium: 2.9 mmol/L — ABNORMAL LOW (ref 3.5–5.1)
SODIUM: 140 mmol/L (ref 135–145)

## 2014-05-23 LAB — CBC
HCT: 41.1 % (ref 36.0–46.0)
Hemoglobin: 13.5 g/dL (ref 12.0–15.0)
MCH: 29 pg (ref 26.0–34.0)
MCHC: 32.8 g/dL (ref 30.0–36.0)
MCV: 88.4 fL (ref 78.0–100.0)
Platelets: 280 10*3/uL (ref 150–400)
RBC: 4.65 MIL/uL (ref 3.87–5.11)
RDW: 14.7 % (ref 11.5–15.5)
WBC: 13.1 10*3/uL — ABNORMAL HIGH (ref 4.0–10.5)

## 2014-05-23 LAB — POC OCCULT BLOOD, ED: Fecal Occult Bld: NEGATIVE

## 2014-05-23 MED ORDER — POTASSIUM CHLORIDE CRYS ER 20 MEQ PO TBCR
40.0000 meq | EXTENDED_RELEASE_TABLET | Freq: Once | ORAL | Status: AC
  Administered 2014-05-23: 40 meq via ORAL
  Filled 2014-05-23: qty 2

## 2014-05-23 MED ORDER — POTASSIUM CHLORIDE CRYS ER 20 MEQ PO TBCR: 20.0000 meq | EXTENDED_RELEASE_TABLET | Freq: Every day | ORAL | Status: DC

## 2014-05-23 NOTE — ED Provider Notes (Signed)
CSN: [id number]     Arrival date & time 05/23/14  1210 History   First MD Initiated Contact with Patient 05/23/14 1310     Chief Complaint  Patient presents with  . Rectal Bleeding     (Consider location/radiation/quality/duration/timing/severity/associated sxs/prior Treatment) HPI  59 year old female presents with bright red blood in her stool for the past 5 days. States when it started there was a larger amount of blood but today she had 3 looser stools with some bright red blood. Patient states initially she was straining to have bowel movements but is not currently doing that. Has had mild intermittent lower abdominal cramping that is not currently present. No nausea or vomiting. Is supposed to be on a baby aspirin but stopped taking this recently. No other blood thinners. The patient denies any known history of hemorrhoids or rectal pain. No lightheadedness, dizziness, or chest pain. The patient has never had a colonoscopy.  Past Medical History  Diagnosis Date  . Anxiety   . Depression   . Hypertension   . Fibroid tumor    Past Surgical History  Procedure Laterality Date  . Cesearean section    . Hernia repair     No family history on file. History  Substance Use Topics  . Smoking status: Former Research scientist (life sciences)  . Smokeless tobacco: Not on file  . Alcohol Use: No   OB History    No data available     Review of Systems  Constitutional: Negative for fatigue.  Respiratory: Negative for shortness of breath.   Cardiovascular: Negative for chest pain.  Gastrointestinal: Positive for abdominal pain and blood in stool. Negative for vomiting and diarrhea.  Neurological: Negative for dizziness, light-headedness and headaches.  All other systems reviewed and are negative.     Allergies  Morphine  Home Medications   Prior to Admission medications   Medication Sig Start Date End Date Taking? Authorizing Provider  amLODipine (NORVASC) 5 MG tablet Take 1 tablet (5 mg total) by  mouth daily. 07/06/13   Mariel Aloe, MD  aspirin EC 81 MG tablet Take 1 tablet (81 mg total) by mouth daily. 07/06/13   Mariel Aloe, MD  hydrochlorothiazide (HYDRODIURIL) 25 MG tablet Take 1 tablet (25 mg total) by mouth at bedtime. 02/17/14   Patrecia Pour, MD  ibuprofen (ADVIL,MOTRIN) 800 MG tablet Take 1 tablet (800 mg total) by mouth 3 (three) times daily. 01/18/14   Charlann Lange, PA-C  naproxen (NAPROSYN) 500 MG tablet Take 1 tablet (500 mg total) by mouth 2 (two) times daily with a meal. 11/10/13   Olin Hauser, DO  traZODone (DESYREL) 50 MG tablet Take 0.5 tablets (25 mg total) by mouth at bedtime. May increase to whole tablet if needed 02/17/14   Patrecia Pour, MD   BP 172/87 mmHg  Pulse 100  Temp(Src) 98.7 F (37.1 C) (Oral)  Resp 18  SpO2 100%  LMP 08/20/2011 Physical Exam  Constitutional: She is oriented to person, place, and time. She appears well-developed and well-nourished.  HENT:  Head: Normocephalic and atraumatic.  Right Ear: External ear normal.  Left Ear: External ear normal.  Nose: Nose normal.  Eyes: Right eye exhibits no discharge. Left eye exhibits no discharge.  Cardiovascular: Normal rate, regular rhythm and normal heart sounds.   Pulmonary/Chest: Effort normal and breath sounds normal.  Abdominal: Soft. She exhibits no distension. There is no tenderness.  Genitourinary: Rectal exam shows no external hemorrhoid, no internal hemorrhoid, no fissure, no mass, no  tenderness and anal tone normal. Guaiac negative stool.  Neurological: She is alert and oriented to person, place, and time.  Skin: Skin is warm and dry.  Nursing note and vitals reviewed.   ED Course  Procedures (including critical care time) Labs Review Labs Reviewed  CBC - Abnormal; Notable for the following:    WBC 13.1 (*)    All other components within normal limits  BASIC METABOLIC PANEL - Abnormal; Notable for the following:    Potassium 2.9 (*)    Glucose, Bld 122 (*)    All  other components within normal limits  POC OCCULT BLOOD, ED    Imaging Review No results found.   EKG Interpretation None      MDM   Final diagnoses:  BRBPR (bright red blood per rectum)  Hypokalemia    No evidence of acute bleeding at this time, normal rectal exam. Hemoglobin normal and unchanged from before. Patient has had some lower abdominal cramping but none now. Has had mild diarrhea, likely the cause of her mild hypokalemia. Will replete orally. I stressed the importance of getting a colonoscopy both for the bleeding she is experiencing as well as her age. Will refer to gastroenterology. Do not feel acute imaging is indicated.    Sherwood Gambler, MD 05/23/14 [phone]

## 2014-05-23 NOTE — ED Notes (Signed)
She states she has been seeing "a small amount of blood whenever I have a b.m." for past few days.  She is in no distress.

## 2014-05-23 NOTE — Discharge Instructions (Signed)
Bloody Stools Bloody stools often mean that there is a problem in the digestive tract. Your caregiver may use the term "melena" to describe black, tarry, and bad smelling stools or "hematochezia" to describe red or maroon-colored stools. Blood seen in the stool can be caused by bleeding anywhere along the intestinal tract.  A black stool usually means that blood is coming from the upper part of the gastrointestinal tract (esophagus, stomach, or small bowel). Passing maroon-colored stools or bright red blood usually means that blood is coming from lower down in the large bowel or the rectum. However, sometimes massive bleeding in the stomach or small intestine can cause bright red bloody stools.  Consuming black licorice, lead, iron pills, medicines containing bismuth subsalicylate, or blueberries can also cause black stools. Your caregiver can test black stools to see if blood is present. It is important that the cause of the bleeding be found. Treatment can then be started, and the problem can be corrected. Rectal bleeding may not be serious, but you should not assume everything is okay until you know the cause.It is very important to follow up with your caregiver or a specialist in gastrointestinal problems. CAUSES  Blood in the stools can come from various underlying causes.Often, the cause is not found during your first visit. Testing is often needed to discover the cause of bleeding in the gastrointestinal tract. Causes range from simple to serious or even life-threatening.Possible causes include:  Hemorrhoids.These are veins that are full of blood (engorged) in the rectum. They cause pain, inflammation, and may bleed.  Anal fissures.These are areas of painful tearing which may bleed. They are often caused by passing hard stool.  Diverticulosis.These are pouches that form on the colon over time, with age, and may bleed significantly.  Diverticulitis.This is inflammation in areas with  diverticulosis. It can cause pain, fever, and bloody stools, although bleeding is rare.  Proctitis and colitis. These are inflamed areas of the rectum or colon. They may cause pain, fever, and bloody stools.  Polyps and cancer. Colon cancer is a leading cause of preventable cancer death.It often starts out as precancerous polyps that can be removed during a colonoscopy, preventing progression into cancer. Sometimes, polyps and cancer may cause rectal bleeding.  Gastritis and ulcers.Bleeding from the upper gastrointestinal tract (near the stomach) may travel through the intestines and produce black, sometimes tarry, often bad smelling stools. In certain cases, if the bleeding is fast enough, the stools may not be black, but red and the condition may be life-threatening. SYMPTOMS  You may have stools that are bright red and bloody, that are normal color with blood on them, or that are dark black and tarry. In some cases, you may only have blood in the toilet bowl. Any of these cases need medical care. You may also have:  Pain at the anus or anywhere in the rectum.  Lightheadedness or feeling faint.  Extreme weakness.  Nausea or vomiting.  Fever. DIAGNOSIS Your caregiver may use the following methods to find the cause of your bleeding:  Taking a medical history. Age is important. Older people tend to develop polyps and cancer more often. If there is anal pain and a hard, large stool associated with bleeding, a tear of the anus may be the cause. If blood drips into the toilet after a bowel movement, bleeding hemorrhoids may be the problem. The color and frequency of the bleeding are additional considerations. In most cases, the medical history provides clues, but seldom the final  answer.  A visual and finger (digital) exam. Your caregiver will inspect the anal area, looking for tears and hemorrhoids. A finger exam can provide information when there is tenderness or a growth inside. In men, the  prostate is also examined.  Endoscopy. Several types of small, long scopes (endoscopes) are used to view the colon.  In the office, your caregiver may use a rigid, or more commonly, a flexible viewing sigmoidoscope. This exam is called flexible sigmoidoscopy. It is performed in 5 to 10 minutes.  A more thorough exam is accomplished with a colonoscope. It allows your caregiver to view the entire 5 to 6 foot long colon. Medicine to help you relax (sedative) is usually given for this exam. Frequently, a bleeding lesion may be present beyond the reach of the sigmoidoscope. So, a colonoscopy may be the best exam to start with. Both exams are usually done on an outpatient basis. This means the patient does not stay overnight in the hospital or surgery center.  An upper endoscopy may be needed to examine your stomach. Sedation is used and a flexible endoscope is put in your mouth, down to your stomach.  A barium enema X-ray. This is an X-ray exam. It uses liquid barium inserted by enema into the rectum. This test alone may not identify an actual bleeding point. X-rays highlight abnormal shadows, such as those made by lumps (tumors), diverticuli, or colitis. TREATMENT  Treatment depends on the cause of your bleeding.   For bleeding from the stomach or colon, the caregiver doing your endoscopy or colonoscopy may be able to stop the bleeding as part of the procedure.  Inflammation or infection of the colon can be treated with medicines.  Many rectal problems can be treated with creams, suppositories, or warm baths.  Surgery is sometimes needed.  Blood transfusions are sometimes needed if you have lost a lot of blood.  For any bleeding problem, let your caregiver know if you take aspirin or other blood thinners regularly. HOME CARE INSTRUCTIONS   Take any medicines exactly as prescribed.  Keep your stools soft by eating a diet high in fiber. Prunes (1 to 3 a day) work well for many people.  Drink  enough water and fluids to keep your urine clear or pale yellow.  Take sitz baths if advised. A sitz bath is when you sit in a bathtub with warm water for 10 to 15 minutes to soak, soothe, and cleanse the rectal area.  If enemas or suppositories are advised, be sure you know how to use them. Tell your caregiver if you have problems with this.  Monitor your bowel movements to look for signs of improvement or worsening. SEEK MEDICAL CARE IF:   You do not improve in the time expected.  Your condition worsens after initial improvement.  You develop any new symptoms. SEEK IMMEDIATE MEDICAL CARE IF:   You develop severe or prolonged rectal bleeding.  You vomit blood.  You feel weak or faint.  You have a fever. MAKE SURE YOU:  Understand these instructions.  Will watch your condition.  Will get help right away if you are not doing well or get worse. Document Released: 12/29/2001 Document Revised: 04/02/2011 Document Reviewed: 05/26/2010 [facility] Patient Information 2015 [place], [place]. This information is not intended to replace advice given to you by your health care provider. Make sure you discuss any questions you have with your health care provider.    Hypokalemia Hypokalemia means that the amount of potassium in the  blood is lower than normal.Potassium is a chemical, called an electrolyte, that helps regulate the amount of fluid in the body. It also stimulates muscle contraction and helps nerves function properly.Most of the body's potassium is inside of cells, and only a very small amount is in the blood. Because the amount in the blood is so small, minor changes can be life-threatening. CAUSES  Antibiotics.  Diarrhea or vomiting.  Using laxatives too much, which can cause diarrhea.  Chronic kidney disease.  Water pills (diuretics).  Eating disorders (bulimia).  Low magnesium level.  Sweating a lot. SIGNS AND  SYMPTOMS  Weakness.  Constipation.  Fatigue.  Muscle cramps.  Mental confusion.  Skipped heartbeats or irregular heartbeat (palpitations).  Tingling or numbness. DIAGNOSIS  Your health care provider can diagnose hypokalemia with blood tests. In addition to checking your potassium level, your health care provider may also check other lab tests. TREATMENT Hypokalemia can be treated with potassium supplements taken by mouth or adjustments in your current medicines. If your potassium level is very low, you may need to get potassium through a vein (IV) and be monitored in the hospital. A diet high in potassium is also helpful. Foods high in potassium are:  Nuts, such as peanuts and pistachios.  Seeds, such as sunflower seeds and pumpkin seeds.  Peas, lentils, and lima beans.  Whole grain and bran cereals and breads.  Fresh fruit and vegetables, such as apricots, avocado, bananas, cantaloupe, kiwi, oranges, tomatoes, asparagus, and potatoes.  Orange and tomato juices.  Red meats.  Fruit yogurt. HOME CARE INSTRUCTIONS  Take all medicines as prescribed by your health care provider.  Maintain a healthy diet by including nutritious food, such as fruits, vegetables, nuts, whole grains, and lean meats.  If you are taking a laxative, be sure to follow the directions on the label. SEEK MEDICAL CARE IF:  Your weakness gets worse.  You feel your heart pounding or racing.  You are vomiting or having diarrhea.  You are diabetic and having trouble keeping your blood glucose in the normal range. SEEK IMMEDIATE MEDICAL CARE IF:  You have chest pain, shortness of breath, or dizziness.  You are vomiting or having diarrhea for more than 2 days.  You faint. MAKE SURE YOU:   Understand these instructions.  Will watch your condition.  Will get help right away if you are not doing well or get worse. Document Released: 01/08/2005 Document Revised: 10/29/2012 Document Reviewed:  07/11/2012 [facility] Patient Information 2015 [place], [place]. This information is not intended to replace advice given to you by your health care provider. Make sure you discuss any questions you have with your health care provider.

## 2014-05-24 ENCOUNTER — Encounter: Payer: Self-pay | Admitting: Gastroenterology

## 2014-06-17 ENCOUNTER — Ambulatory Visit (INDEPENDENT_AMBULATORY_CARE_PROVIDER_SITE_OTHER): Payer: No Typology Code available for payment source | Admitting: Gastroenterology

## 2014-06-17 ENCOUNTER — Encounter: Payer: Self-pay | Admitting: Gastroenterology

## 2014-06-17 VITALS — BP 116/82 | HR 72 | Ht 64.75 in | Wt 192.0 lb

## 2014-06-17 DIAGNOSIS — K625 Hemorrhage of anus and rectum: Secondary | ICD-10-CM

## 2014-06-17 HISTORY — DX: Hemorrhage of anus and rectum: K62.5

## 2014-06-17 NOTE — Assessment & Plan Note (Signed)
Limited rectal bleeding is probably hemorrhoidal.  A more proximal colonic bleeding source should be ruled out.    Recommendations #1 colonoscopy  CC Dr. Lonny Prude

## 2014-06-17 NOTE — Patient Instructions (Signed)

## 2014-06-17 NOTE — Progress Notes (Signed)
_                                                                                                                History of Present Illness:  Yvette Bell is a 59 year old Afro-American female referred at the request of Dr. Lonny Prude evaluation of rectal bleeding.  She was seen in the ED earlier this month with limited rectal bleeding.  She noticed some blood coating the stools.  Hemoglobin was 15.  She's had no recurrences.  She denies change of bowel habits, abdominal or rectal pain.   Past Medical History  Diagnosis Date  . Anxiety   . Depression   . Hypertension   . Fibroid tumor    Past Surgical History  Procedure Laterality Date  . Cesearean section    . Hernia repair     family history includes Colon polyps in her sister; Diabetes in her sister. There is no history of Colon cancer, Esophageal cancer, Kidney disease, Gallbladder disease, or Heart disease. Current Outpatient Prescriptions  Medication Sig Dispense Refill  . aspirin EC 81 MG tablet Take 1 tablet (81 mg total) by mouth daily. 90 tablet 3  . hydrochlorothiazide (HYDRODIURIL) 25 MG tablet Take 1 tablet (25 mg total) by mouth at bedtime. 90 tablet 1  . ibuprofen (ADVIL,MOTRIN) 800 MG tablet Take 1 tablet (800 mg total) by mouth 3 (three) times daily. (Patient taking differently: Take 800 mg by mouth every 8 (eight) hours as needed for mild pain or cramping. ) 21 tablet 0  . naproxen (NAPROSYN) 500 MG tablet Take 1 tablet (500 mg total) by mouth 2 (two) times daily with a meal. (Patient taking differently: Take 500 mg by mouth daily as needed. ) 30 tablet 0  . potassium chloride SA (K-DUR,KLOR-CON) 20 MEQ tablet Take 1 tablet (20 mEq total) by mouth daily. 3 tablet 0  . traZODone (DESYREL) 50 MG tablet Take 0.5 tablets (25 mg total) by mouth at bedtime. May increase to whole tablet if needed 30 tablet 1   No current facility-administered medications for this visit.   Allergies as of 06/17/2014 - Review  Complete 06/17/2014  Allergen Reaction Noted  . Morphine  03/30/2009  . Pollen extract Other (See Comments) 05/23/2014    reports that she has never smoked. She has never used smokeless tobacco. She reports that she drinks alcohol. She reports that she does not use illicit drugs.   Review of Systems: Pertinent positive and negative review of systems were noted in the above HPI section. All other review of systems were otherwise negative.  Vital signs were reviewed in today's medical record Physical Exam: General: Well developed , well nourished, no acute distress Skin: anicteric Head: Normocephalic and atraumatic Eyes:  sclerae anicteric, EOMI Ears: Normal auditory acuity Mouth: No deformity or lesions Neck: Supple, no masses or thyromegaly Lymph Nodes: no lymphadenopathy Lungs: Clear throughout to auscultation Heart: Regular rate and rhythm; no murmurs, rubs or bruits Gastroinestinal: Soft, non tender and non distended. No masses, hepatosplenomegaly or hernias  noted. Normal Bowel sounds Rectal:deferred Musculoskeletal: Symmetrical with no gross deformities  Skin: No lesions on visible extremities Pulses:  Normal pulses noted Extremities: No clubbing, cyanosis, edema or deformities noted Neurological: Alert oriented x 4, grossly nonfocal Cervical Nodes:  No significant cervical adenopathy Inguinal Nodes: No significant inguinal adenopathy Psychological:  Alert and cooperative. Normal mood and affect  See Assessment and Plan under Problem List

## 2014-07-09 ENCOUNTER — Telehealth: Payer: Self-pay | Admitting: Gastroenterology

## 2014-07-09 NOTE — Telephone Encounter (Signed)
A user error has taken place.

## 2014-07-13 ENCOUNTER — Ambulatory Visit (AMBULATORY_SURGERY_CENTER): Payer: Self-pay | Admitting: Gastroenterology

## 2014-07-13 ENCOUNTER — Encounter: Payer: Self-pay | Admitting: Gastroenterology

## 2014-07-13 VITALS — BP 127/72 | HR 78 | Temp 98.7°F | Resp 24 | Ht 64.75 in | Wt 192.0 lb

## 2014-07-13 DIAGNOSIS — K648 Other hemorrhoids: Secondary | ICD-10-CM

## 2014-07-13 DIAGNOSIS — K635 Polyp of colon: Secondary | ICD-10-CM

## 2014-07-13 DIAGNOSIS — Z1211 Encounter for screening for malignant neoplasm of colon: Secondary | ICD-10-CM

## 2014-07-13 DIAGNOSIS — D125 Benign neoplasm of sigmoid colon: Secondary | ICD-10-CM

## 2014-07-13 DIAGNOSIS — K625 Hemorrhage of anus and rectum: Secondary | ICD-10-CM

## 2014-07-13 DIAGNOSIS — K649 Unspecified hemorrhoids: Secondary | ICD-10-CM

## 2014-07-13 MED ORDER — SODIUM CHLORIDE 0.9 % IV SOLN: 500.0000 mL | INTRAVENOUS | Status: DC

## 2014-07-13 NOTE — Progress Notes (Signed)
Called to room to assist during endoscopic procedure.  Patient ID and intended procedure confirmed with present staff. Received instructions for my participation in the procedure from the performing physician.  

## 2014-07-13 NOTE — Patient Instructions (Addendum)
YOU HAD AN ENDOSCOPIC PROCEDURE TODAY AT [facility]:   Refer to the procedure report that was given to you for any specific questions about what was found during the examination.  If the procedure report does not answer your questions, please call your gastroenterologist to clarify.  If you requested that your care partner not be given the details of your procedure findings, then the procedure report has been included in a sealed envelope for you to review at your convenience later.  YOU SHOULD EXPECT: Some feelings of bloating in the abdomen. Passage of more gas than usual.  Walking can help get rid of the air that was put into your GI tract during the procedure and reduce the bloating. If you had a lower endoscopy (such as a colonoscopy or flexible sigmoidoscopy) you may notice spotting of blood in your stool or on the toilet paper. If you underwent a bowel prep for your procedure, you may not have a normal bowel movement for a few days.  Please Note:  You might notice some irritation and congestion in your nose or some drainage.  This is from the oxygen used during your procedure.  There is no need for concern and it should clear up in a day or so.  SYMPTOMS TO REPORT IMMEDIATELY:   Following lower endoscopy (colonoscopy or flexible sigmoidoscopy):  Excessive amounts of blood in the stool  Significant tenderness or worsening of abdominal pains  Swelling of the abdomen that is new, acute  Fever of 100F or higher    For urgent or emergent issues, a gastroenterologist can be reached at any hour by calling [phone].   DIET: Your first meal following the procedure should be a small meal and then it is ok to progress to your normal diet. Heavy or fried foods are harder to digest and may make you feel nauseous or bloated.  Likewise, meals heavy in dairy and vegetables can increase bloating.  Drink plenty of fluids but you should avoid alcoholic beverages for 24  hours.  ACTIVITY:  You should plan to take it easy for the rest of today and you should NOT DRIVE or use heavy machinery until tomorrow (because of the sedation medicines used during the test).    FOLLOW UP: Our staff will call the number listed on your records the next business day following your procedure to check on you and address any questions or concerns that you may have regarding the information given to you following your procedure. If we do not reach you, we will leave a message.  However, if you are feeling well and you are not experiencing any problems, there is no need to return our call.  We will assume that you have returned to your regular daily activities without incident.  If any biopsies were taken you will be contacted by phone or by letter within the next 1-3 weeks.  Please call us at [phone] if you have not heard about the biopsies in 3 weeks.    SIGNATURES/CONFIDENTIALITY: You and/or your care partner have signed paperwork which will be entered into your electronic medical record.  These signatures attest to the fact that that the information above on your After Visit Summary has been reviewed and is understood.  Full responsibility of the confidentiality of this discharge information lies with you and/or your care-partner   Information on polyps and hemorrhoids given to you today

## 2014-07-13 NOTE — Op Note (Signed)
[facility]  [address] [place], [address]   COLONOSCOPY PROCEDURE REPORT  PATIENT: Yvette Bell, Yvette Bell  MR#: [id number] BIRTHDATE: 05/12/1955 , 62  yrs. old GENDER: female ENDOSCOPIST: Inda Castle, MD REFERRED BY: PROCEDURE DATE:  07/13/2014 PROCEDURE:   Colonoscopy, screening and Colonoscopy with snare polypectomy First Screening Colonoscopy - Avg.  risk and is 50 yrs.  old or older Yes.  Prior Negative Screening - Now for repeat screening. N/A  History of Adenoma - Now for follow-up colonoscopy & has been > or = to 3 yrs.  N/A  Polyps removed today? Yes ASA CLASS:   Class II INDICATIONS:Colorectal Neoplasm Risk Assessment for this procedure is average risk and anal bleeding. MEDICATIONS: Monitored anesthesia care and Propofol 380 mg IV  DESCRIPTION OF PROCEDURE:   After the risks benefits and alternatives of the procedure were thoroughly explained, informed consent was obtained.  The digital rectal exam revealed no abnormalities of the rectum.   The LB [id number]  endoscope was introduced through the anus and advanced to the cecum, which was identified by both the appendix and ileocecal valve. No adverse events experienced.   The quality of the prep was (Suprep was used) excellent.  The instrument was then slowly withdrawn as the colon was fully examined. Estimated blood loss is zero unless otherwise noted in this procedure report.      COLON FINDINGS: A sessile polyp measuring 4 mm in size was found in the sigmoid colon.  A polypectomy was performed with a cold snare. The resection was complete, the polyp tissue was completely retrieved and sent to histology.   Internal hemorrhoids were found. Retroflexed views revealed no abnormalities. The time to cecum = 7.1 Withdrawal time = 6.9   The scope was withdrawn and the procedure completed. COMPLICATIONS: There were no immediate complications.  ENDOSCOPIC IMPRESSION: 1.   Sessile polyp was found  in the sigmoid colon; polypectomy was performed with a cold snare 2.   Internal hemorrhoids  RECOMMENDATIONS: If the polyp(s) removed today are proven to be adenomatous (pre-cancerous) polyps, you will need a repeat colonoscopy in 5 years.  Otherwise you should continue to follow colorectal cancer screening guidelines for "routine risk" patients with colonoscopy in 10 years.  You will receive a letter within 1-2 weeks with the results of your biopsy as well as final recommendations.  Please call my office if you have not received a letter after 3 weeks. Hemorrhoidal suppositories as needed for rectal bleeding  eSigned:  Inda Castle, MD 07/13/2014 4:13 PM   cc: Cordelia Poche, MD   PATIENT NAME:  Yvette Bell, Yvette Bell MR#: [id number]

## 2014-07-13 NOTE — Progress Notes (Signed)
To recovery, report to [place], RN, VSS 

## 2014-07-14 ENCOUNTER — Telehealth: Payer: Self-pay | Admitting: Gastroenterology

## 2014-07-14 ENCOUNTER — Telehealth: Payer: Self-pay | Admitting: *Deleted

## 2014-07-14 NOTE — Telephone Encounter (Signed)
Left message on answering machine. 

## 2014-07-14 NOTE — Telephone Encounter (Signed)
Left message to call back  

## 2014-07-14 NOTE — Telephone Encounter (Signed)
Spoke with the patient. She just woke up. Complains of both her hips hurting down into her thighs. She states she has some arthritis but does not take regular medication. Denies any abdominal pain or unusual bleeding. Please advise.

## 2014-07-14 NOTE — Telephone Encounter (Signed)
She can try ibuprofen but should speak to her PCP further about this

## 2014-07-16 NOTE — Telephone Encounter (Signed)
Left message to call.

## 2014-07-16 NOTE — Telephone Encounter (Signed)
Information left for the patient on her voicemail.

## 2014-07-20 ENCOUNTER — Encounter: Payer: Self-pay | Admitting: Gastroenterology

## 2014-09-03 ENCOUNTER — Other Ambulatory Visit: Payer: Self-pay | Admitting: Family Medicine

## 2014-12-10 ENCOUNTER — Other Ambulatory Visit: Payer: Self-pay | Admitting: Family Medicine

## 2014-12-15 NOTE — Telephone Encounter (Signed)
Patient needs appointment for further refills

## 2015-01-19 ENCOUNTER — Telehealth: Payer: Self-pay | Admitting: Family Medicine

## 2015-01-19 MED ORDER — HYDROCHLOROTHIAZIDE 25 MG PO TABS: 25.0000 mg | ORAL_TABLET | Freq: Every day | ORAL | Status: DC

## 2015-01-19 NOTE — Telephone Encounter (Signed)
Refill placed

## 2015-01-19 NOTE — Telephone Encounter (Signed)
Pt called because she needs a refill on her BP medication called in. She does have an appointment for 01/21/15. Blima Rich

## 2015-01-21 ENCOUNTER — Encounter: Payer: Self-pay | Admitting: Family Medicine

## 2015-01-21 ENCOUNTER — Other Ambulatory Visit: Payer: Self-pay

## 2015-01-21 ENCOUNTER — Ambulatory Visit (INDEPENDENT_AMBULATORY_CARE_PROVIDER_SITE_OTHER): Payer: Self-pay | Admitting: Family Medicine

## 2015-01-21 VITALS — BP 154/102 | HR 95 | Temp 99.2°F | Wt 192.0 lb

## 2015-01-21 DIAGNOSIS — J069 Acute upper respiratory infection, unspecified: Secondary | ICD-10-CM

## 2015-01-21 DIAGNOSIS — R1030 Lower abdominal pain, unspecified: Secondary | ICD-10-CM

## 2015-01-21 DIAGNOSIS — A499 Bacterial infection, unspecified: Secondary | ICD-10-CM

## 2015-01-21 DIAGNOSIS — I10 Essential (primary) hypertension: Secondary | ICD-10-CM

## 2015-01-21 DIAGNOSIS — B9689 Other specified bacterial agents as the cause of diseases classified elsewhere: Secondary | ICD-10-CM

## 2015-01-21 DIAGNOSIS — L989 Disorder of the skin and subcutaneous tissue, unspecified: Secondary | ICD-10-CM

## 2015-01-21 DIAGNOSIS — N760 Acute vaginitis: Secondary | ICD-10-CM

## 2015-01-21 DIAGNOSIS — Z1231 Encounter for screening mammogram for malignant neoplasm of breast: Secondary | ICD-10-CM

## 2015-01-21 DIAGNOSIS — R238 Other skin changes: Secondary | ICD-10-CM

## 2015-01-21 LAB — POCT UA - MICROSCOPIC ONLY

## 2015-01-21 LAB — POCT WET PREP (WET MOUNT): Clue Cells Wet Prep Whiff POC: NEGATIVE

## 2015-01-21 LAB — POCT URINALYSIS DIPSTICK
BILIRUBIN UA: NEGATIVE
Glucose, UA: NEGATIVE
Ketones, UA: NEGATIVE
LEUKOCYTES UA: NEGATIVE
NITRITE UA: NEGATIVE
PROTEIN UA: NEGATIVE
Spec Grav, UA: 1.025
Urobilinogen, UA: 0.2
pH, UA: 6

## 2015-01-21 MED ORDER — HYDROCHLOROTHIAZIDE 25 MG PO TABS: 25.0000 mg | ORAL_TABLET | Freq: Every day | ORAL | Status: DC

## 2015-01-21 MED ORDER — CEPHALEXIN 500 MG PO CAPS: 500.0000 mg | ORAL_CAPSULE | Freq: Two times a day (BID) | ORAL | Status: DC

## 2015-01-21 MED ORDER — METRONIDAZOLE 500 MG PO TABS: 500.0000 mg | ORAL_TABLET | Freq: Two times a day (BID) | ORAL | Status: DC

## 2015-01-21 MED ORDER — LIDOCAINE 5 % EX OINT: 1.0000 "application " | TOPICAL_OINTMENT | CUTANEOUS | Status: DC | PRN

## 2015-01-21 NOTE — Patient Instructions (Signed)
Thank you for coming to see me today. It was a pleasure. Today we talked about:   Hypertension: I am refilling your hydrochlorothiazide  Abdominal pain: I am getting some labs. I will let you know of the results  Left side pain: I will give you some ointment to help with the pain. Apply a thin layer of this  Upper respiratory infection: This should get better in 7-10 days.  Please make an appointment to see me in 1-3 months for follow-up of hypertension, abdominal pain and side pain.  If you have any questions or concerns, please do not hesitate to call the office at [phone].  Sincerely,  Cordelia Poche, MD

## 2015-01-21 NOTE — Progress Notes (Signed)
    Subjective    Yvette Bell is a 59 y.o. female that presents for a follow-up visit for chronic issues.   1. Left side chest pain: Symptoms started about 3 weeks ago. Pain is sharp and radiates from side to front. No injury. She reports some heavy lifting. Pain is present when she bends forward or twists to the right. No dyspnea or pain with breathing. Pain has remained constant. She has not taken anything for the pain.  2. Abdominal pain: Symptoms started a few months ago. She describes the pain as cramping. Pain is present most days out of the week. She has not tried anything for this pain. She is unaware of anything that aggravates her pain. Keeping herself busy, including exercising, alleviates pain. It is currently hurting. She has not had a period since 2015. She still has her uterus. She has a history of C-section and left/right inguinal hernia repair. She reports some brown vaginal discharge. She is not sexually active. History of gonorrhea and PID that were treated.   3. Hypertension: She is adherent with hydrochlorothiazide 25mg  daily but has not had her dose today. No chest pain or dyspnea.  4. Sinus issues: This is a recurrent issues. She reports rhinorrhea, sneezing and nasal congestion. No fevers.  Social History  Substance Use Topics  . Smoking status: Never Smoker   . Smokeless tobacco: Never Used  . Alcohol Use: 0.0 oz/week    0 Standard drinks or equivalent per week     Comment: Occassionally    Allergies  Allergen Reactions  . Morphine     REACTION: breathing issues  . Pollen Extract Other (See Comments)    Seasonal allergy symptoms    No orders of the defined types were placed in this encounter.    ROS  Per HPI   Objective   BP 154/102 mmHg  Pulse 95  Temp(Src) 99.2 F (37.3 C) (Oral)  Wt 192 lb (87.091 kg)  LMP 08/20/2011  General: Well appearing, no distress HEENT:   Head:  Normocephalic, no sinus tenderness  Eyes: Pupils equal and reactive to  light/accomodation. Extraocular movements intact bilaterally.  Ears: Tympanic membranes normal bilaterally.  Nose/Throat: Nares patent bilaterally with mucosal edema in right nostril. Oropharnx clear and moist.  Neck: No cervical adenopathy bilaterally Respiratory/Chest: Slight tenderness on left rib area underneath bra that extends to breast area with no rash present Gastrointestinal: Soft, non-tender, non-distended Genitourinary: Vaginal exam significant for white discharge, no odor and no bleeding. Cervix appears normal with no CMT. No uterine or adnexal tenderness     Assessment and Plan     HYPERTENSION, BENIGN SYSTEMIC Uncontrolled today. Patient not adherent with regimen. Will continue and recheck. Asymptomatic.  Bacterial vaginosis Metronidazole 500mg  BID x7 days. Alcohol precautions given  Lower abdominal pain Unsure of etiology. No red flags. No concern for STI. Urinalysis microscopy suggests a UTI. Urine culture not obtained before specimen was thrown out. Keflex 500mg  BID x7 days for treatment. Follow-up for abdominal pain if no resolution  Skin sensitivity Pain appears more superficial and possibly related to bra irritation. Will trial lidocaine ointment for pain relief. Follow-up if no improvement.

## 2015-01-22 DIAGNOSIS — N760 Acute vaginitis: Secondary | ICD-10-CM

## 2015-01-22 DIAGNOSIS — R238 Other skin changes: Secondary | ICD-10-CM

## 2015-01-22 DIAGNOSIS — B9689 Other specified bacterial agents as the cause of diseases classified elsewhere: Secondary | ICD-10-CM | POA: Insufficient documentation

## 2015-01-22 DIAGNOSIS — R1030 Lower abdominal pain, unspecified: Secondary | ICD-10-CM | POA: Insufficient documentation

## 2015-01-22 HISTORY — DX: Other skin changes: R23.8

## 2015-01-22 NOTE — Assessment & Plan Note (Signed)
Pain appears more superficial and possibly related to bra irritation. Will trial lidocaine ointment for pain relief. Follow-up if no improvement.

## 2015-01-22 NOTE — Assessment & Plan Note (Signed)
Metronidazole 500mg  BID x7 days. Alcohol precautions given

## 2015-01-22 NOTE — Assessment & Plan Note (Addendum)
Uncontrolled today. Patient not adherent with regimen. Will continue and recheck. Asymptomatic.

## 2015-01-22 NOTE — Assessment & Plan Note (Signed)
Unsure of etiology. No red flags. No concern for STI. Urinalysis microscopy suggests a UTI. Urine culture not obtained before specimen was thrown out. Keflex 500mg  BID x7 days for treatment. Follow-up for abdominal pain if no resolution

## 2015-01-27 ENCOUNTER — Telehealth: Payer: Self-pay | Admitting: Family Medicine

## 2015-01-27 NOTE — Telephone Encounter (Signed)
Left message for patient to call back. Unfortunately, it appears urine culture was not performed for her urine. If she is calling about this result, there unfortunately is none. If she is still having symptoms, we can discuss. If she is asymptomatic, no further evaluation.

## 2015-01-27 NOTE — Telephone Encounter (Signed)
Pt would like to receive results from visit on Friday at the earliest convenience. Sadie Reynolds, ASA

## 2015-01-28 NOTE — Telephone Encounter (Signed)
Pt called back and would like to speak to the doctor about her medication jw

## 2015-02-01 MED ORDER — SULFAMETHOXAZOLE-TRIMETHOPRIM 800-160 MG PO TABS: 1.0000 | ORAL_TABLET | Freq: Two times a day (BID) | ORAL | Status: DC

## 2015-02-01 NOTE — Telephone Encounter (Signed)
Called patient and discussed results. She is still having some cramping. She did not tolerate keflex and has not been taking it. Discussed that urine culture was not obtained so cannot direct antibiotic treatment better. Will try bactrim DS BID for 3 days. Follow-up if no improvement. Also discussed that because previous UA had RBCs, she would need repeat UA after symptoms clear.

## 2015-02-09 ENCOUNTER — Ambulatory Visit
Admission: RE | Admit: 2015-02-09 | Discharge: 2015-02-09 | Disposition: A | Discharge status: Home / Self Care | Payer: Medicaid Other | Source: Ambulatory Visit

## 2015-02-09 DIAGNOSIS — Z1231 Encounter for screening mammogram for malignant neoplasm of breast: Secondary | ICD-10-CM

## 2015-03-04 ENCOUNTER — Encounter: Payer: Self-pay | Admitting: Internal Medicine

## 2015-03-04 ENCOUNTER — Ambulatory Visit (INDEPENDENT_AMBULATORY_CARE_PROVIDER_SITE_OTHER): Payer: Medicaid Other | Admitting: Internal Medicine

## 2015-03-04 VITALS — BP 168/98 | HR 90 | Temp 98.2°F | Wt 195.4 lb

## 2015-03-04 DIAGNOSIS — R358 Other polyuria: Secondary | ICD-10-CM | POA: Diagnosis not present

## 2015-03-04 DIAGNOSIS — I10 Essential (primary) hypertension: Secondary | ICD-10-CM

## 2015-03-04 DIAGNOSIS — R1030 Lower abdominal pain, unspecified: Secondary | ICD-10-CM

## 2015-03-04 DIAGNOSIS — R350 Frequency of micturition: Secondary | ICD-10-CM | POA: Diagnosis not present

## 2015-03-04 DIAGNOSIS — N390 Urinary tract infection, site not specified: Secondary | ICD-10-CM | POA: Diagnosis not present

## 2015-03-04 LAB — POCT WET PREP (WET MOUNT): CLUE CELLS WET PREP WHIFF POC: NEGATIVE

## 2015-03-04 LAB — POCT URINALYSIS DIP (MANUAL ENTRY)
BILIRUBIN UA: NEGATIVE
GLUCOSE UA: NEGATIVE
LEUKOCYTES UA: NEGATIVE
Nitrite, UA: NEGATIVE
Protein Ur, POC: 30 — AB
Spec Grav, UA: 1.02
Urobilinogen, UA: 1
pH, UA: 8

## 2015-03-04 LAB — POCT GLYCOSYLATED HEMOGLOBIN (HGB A1C): HEMOGLOBIN A1C: 5.3

## 2015-03-04 MED ORDER — HYDROCHLOROTHIAZIDE 25 MG PO TABS: 25.0000 mg | ORAL_TABLET | Freq: Every day | ORAL | Status: DC

## 2015-03-04 MED ORDER — NITROFURANTOIN MONOHYD MACRO 100 MG PO CAPS: 100.0000 mg | ORAL_CAPSULE | Freq: Two times a day (BID) | ORAL | Status: DC

## 2015-03-04 NOTE — Patient Instructions (Signed)
Ms. Sweetin,  Thank you for coming in today.  Based on the initial results from your urine, you most likely have a bladder infection. I recommend taking the antibiotic nitrofurantoin 100 mg twice daily for 5 days. I will call you with the results of the urine culture when they are ready.  You also got a check of your blood sugar with a lab called hemoglobin A1c. Elevated sugars can also cause increased urination. However, your urine did not have any sugar, so that is reassuring.  For your blood pressure, please continue to take your hydrochlorothiazide.   Please return if you don't have improvement in symptoms or if you have any back pain, fever, or vomiting/diarrhea.   Best, Dr. Ola Spurr

## 2015-03-04 NOTE — Progress Notes (Signed)
Subjective:     Patient ID: Yvette Bell, female   DOB: April 19, 1955, 60 y.o.   MRN: [id number]  HPI  Increased Urinary Frequency: - Began 1 month ago and improved briefly after completing treatment with bactrim. Urinary frequency restarted about 2 weeks ago. She had been prescribed keflex for a urine sample that showed 2+ bacteria and 5-10 RBCs. No urine culture was performed at that time. She could not tolerate the keflex due to stomach upset, so a 3 day course of bactrim was prescribed, which she completed. - She has not been sexually active since last spring.  - Associated with occasional crampy abdominal pain.  - No dysuria. No incontinence.  - Wet prep 01/21/15 showed few clue cells. Patient completed week-long course of flagyl. - She denies n/v/d. No fevers. - She has not had her period in 2-3 years. - Sister does have T2DM. Patient's last hgb A1c was 5.6 07/07/13. Reports weight gain since menopause. Has gained 20 pounds since 2011. She recently has been trying to cut back on bread. She drinks only infrequently, one glass of wine a week at most. She rarely drinks soda. She says she has been eating more sweets lately. She has started doing yoga and plans to walk for exercise when weather gets warmer.   HTN: - Has not taken HCTZ for past 3 days because she thought it could be contributing to her increased urination. However, this is a long-term medication. - Patient says she normally does not miss any doses during any given week. - She denies eating much salt and does not eat canned foods. She says she is mostly a vegetarian but eats fish and lots of fruits and vegetables. - Only has a few pills left of her HTCZ and requesting refill today.   Review of Systems  Constitutional: Negative for fever.  Eyes: Negative for visual disturbance.  Cardiovascular: Negative for chest pain.  Gastrointestinal: Negative for nausea, vomiting and diarrhea.  Genitourinary: Positive for frequency. Negative for  dysuria and flank pain.  Musculoskeletal: Negative for back pain.  Neurological: Negative for headaches.      Objective:   Physical Exam  Constitutional: She is oriented to person, place, and time. She appears well-developed and well-nourished. No distress.  Abdominal: Soft. Bowel sounds are normal. She exhibits no distension and no mass. There is tenderness (Mild suprapubic tenderness.). There is no rebound and no guarding.  Genitourinary: Vaginal discharge (Minimal thick white discharge on speculum exam. ) found.  Musculoskeletal:  No CVA tenderness.   Neurological: She is alert and oriented to person, place, and time.  Skin: Skin is warm and dry.  Psychiatric: She has a normal mood and affect. Her behavior is normal.   Results for orders placed or performed in visit on 03/04/15 (from the past 24 hour(s))  POCT urinalysis dipstick     Status: Abnormal   Collection Time: 03/04/15  3:45 PM  Result Value Ref Range   Color, UA yellow yellow   Clarity, UA clear clear   Glucose, UA negative negative   Bilirubin, UA negative negative   Ketones, POC UA trace (5) (A) negative   Spec Grav, UA 1.020    Blood, UA moderate (A) negative   pH, UA 8.0    Protein Ur, POC =30 (A) negative   Urobilinogen, UA 1.0    Nitrite, UA Negative Negative   Leukocytes, UA Negative Negative   Narrative   QNS - Quantity Not Sufficient for microscopic exam  POCT Wet Prep (  [employer])     Status: Abnormal   Collection Time: 03/04/15  4:25 PM  Result Value Ref Range   Source Wet Prep POC VAG    WBC, Wet Prep HPF POC 5-10    Bacteria Wet Prep HPF POC Many (A) None, Few, Too numerous to count   Clue Cells Wet Prep HPF POC None None, Too numerous to count   Clue Cells Wet Prep Whiff POC Negative Whiff    Yeast Wet Prep HPF POC None    Trichomonas Wet Prep HPF POC NONE   POCT glycosylated hemoglobin (Hb A1C)     Status: None   Collection Time: 03/04/15  4:25 PM  Result Value Ref Range   Hemoglobin A1C 5.3        Assessment:     Ms. Friddle is a 88-y.o. female with 2-week history of increased urinary frequency and occasional abdominal cramping. Lab tests were ordered to evaluate for UTI, recurrent BV and diabetes. Wet prep negative for yeast, clue cells and trichomonas today. UA showed moderate blood but negative nitrites and leukocytes. Hgb A1c was 5.3. Most likely diagnosis is UTI, given blood on UA. Blood pressure elevated today with patient not taking her HCTZ.     Plan:     Patient to return if symptoms do not improve.   UTI (urinary tract infection) - Recommended treatment with 5 day course of nitrofurantoin. - Ordered urine culture. Will call patient with results.   HYPERTENSION, BENIGN SYSTEMIC - Refilled patient's HCTZ. Advised her not to miss doses. - Encouraged patient in her goal to lose weight by decreasing consumption of bread and increasing physical activity.   Olene Floss, MD [facility], PGY-1

## 2015-03-04 NOTE — Assessment & Plan Note (Signed)
-   Recommended treatment with 5 day course of nitrofurantoin. - Ordered urine culture. Will call patient with results.

## 2015-03-04 NOTE — Assessment & Plan Note (Signed)
-   Refilled patient's HCTZ. Advised her not to miss doses. - Encouraged patient in her goal to lose weight by decreasing consumption of bread and increasing physical activity.

## 2015-03-05 LAB — URINE CULTURE: Colony Count: >=100000

## 2015-03-15 ENCOUNTER — Telehealth: Payer: Self-pay | Admitting: Internal Medicine

## 2015-03-15 NOTE — Telephone Encounter (Signed)
Attempted to reach patient about urine culture results. Would recommend repeat urine culture at next follow-up to ensure adequate treatment.

## 2015-04-13 ENCOUNTER — Other Ambulatory Visit: Payer: Self-pay | Admitting: Internal Medicine

## 2015-07-25 ENCOUNTER — Other Ambulatory Visit: Payer: Self-pay | Admitting: Family Medicine

## 2015-08-24 ENCOUNTER — Ambulatory Visit (INDEPENDENT_AMBULATORY_CARE_PROVIDER_SITE_OTHER): Payer: Medicare Other | Admitting: Internal Medicine

## 2015-08-24 ENCOUNTER — Encounter: Payer: Self-pay | Admitting: Internal Medicine

## 2015-08-24 VITALS — BP 149/90 | HR 102 | Temp 98.9°F | Ht 64.75 in | Wt 191.6 lb

## 2015-08-24 DIAGNOSIS — N390 Urinary tract infection, site not specified: Secondary | ICD-10-CM | POA: Diagnosis not present

## 2015-08-24 DIAGNOSIS — N3001 Acute cystitis with hematuria: Secondary | ICD-10-CM | POA: Diagnosis not present

## 2015-08-24 DIAGNOSIS — I10 Essential (primary) hypertension: Secondary | ICD-10-CM | POA: Diagnosis not present

## 2015-08-24 DIAGNOSIS — R300 Dysuria: Secondary | ICD-10-CM | POA: Diagnosis present

## 2015-08-24 LAB — POCT URINALYSIS DIPSTICK
Glucose, UA: NEGATIVE
NITRITE UA: NEGATIVE
PH UA: 6
PROTEIN UA: 30
Spec Grav, UA: >=1.03
UROBILINOGEN UA: 1

## 2015-08-24 MED ORDER — NITROFURANTOIN MONOHYD MACRO 100 MG PO CAPS: 100.0000 mg | ORAL_CAPSULE | Freq: Two times a day (BID) | ORAL | 0 refills | Status: DC

## 2015-08-24 MED ORDER — HYDROCHLOROTHIAZIDE 25 MG PO TABS: 25.0000 mg | ORAL_TABLET | Freq: Every day | ORAL | 2 refills | Status: DC

## 2015-08-24 MED ORDER — PHENAZOPYRIDINE HCL 100 MG PO TABS: 100.0000 mg | ORAL_TABLET | Freq: Three times a day (TID) | ORAL | 0 refills | Status: DC | PRN

## 2015-08-24 NOTE — Patient Instructions (Addendum)
Macrobid for treatment of possibly infection. Pyridium will help with the pain.

## 2015-08-24 NOTE — Progress Notes (Signed)
   [facility] Kerrin Mo, MD Phone: [phone]  Reason For Visit:SDA for Dysuria    # Patient with burning and itching with urination. Increased frequency of urination. Possibly some blood in urine. Sexually active recently but using condom. Denies any concern for STDs.  No fever of chills.  No CVA tenderness. No nausea or vomiting. Patient states she has had UTIs before and this feels exactly the same   Past Medical History Reviewed problem list.  Medications- reviewed and updated  No additions to family history Social history- patient is a non smoker  Objective: BP (!) 149/90   Pulse (!) 102   Temp 98.9 F (37.2 C)   Ht 5' 4.75" (1.645 m)   Wt 191 lb 9.6 oz (86.9 kg)   LMP 08/20/2011   BMI 32.13 kg/m  Gen: NAD, alert, cooperative with exam CV: RRR, good S1/S2, no murmur, Abd: suprapubic tenderness, negative for CVA tenderness, BS present, no guarding or organomegaly  Assessment/Plan: See problem based a/p   UTI (urinary tract infection) Dysuria likely due to UTI. UA positive trace LE and moderate RBCs  - No concern for STDs per patient. Did not want any further testing. - nitrofurantoin, macrocrystal-monohydrate, (MACROBID) 100 MG capsule; Take 1 capsule (100 mg total) by mouth 2 (two) times daily.  Dispense: 14 capsule; Refill: 0 - Urine culture - phenazopyridine (PYRIDIUM) 100 MG tablet; Take 1 tablet (100 mg total) by mouth 3 (three) times daily as needed for pain.  Dispense: 10 tablet; Refill: 0 - Follow up in 1 week if no improvement

## 2015-08-25 LAB — URINE CULTURE: Organism ID, Bacteria: NO GROWTH

## 2015-08-25 NOTE — Assessment & Plan Note (Signed)
Dysuria likely due to UTI. UA positive trace LE and moderate RBCs  - No concern for STDs per patient. Did not want any further testing. - nitrofurantoin, macrocrystal-monohydrate, (MACROBID) 100 MG capsule; Take 1 capsule (100 mg total) by mouth 2 (two) times daily.  Dispense: 14 capsule; Refill: 0 - Urine culture - phenazopyridine (PYRIDIUM) 100 MG tablet; Take 1 tablet (100 mg total) by mouth 3 (three) times daily as needed for pain.  Dispense: 10 tablet; Refill: 0 - Follow up in 1 week if no improvement

## 2015-08-26 ENCOUNTER — Telehealth: Payer: Self-pay | Admitting: Internal Medicine

## 2015-08-26 NOTE — Telephone Encounter (Signed)
Called patient. Left a message letting her know urine culture was negative therefore did not need to finish out course of antibiotics

## 2015-09-14 ENCOUNTER — Ambulatory Visit (INDEPENDENT_AMBULATORY_CARE_PROVIDER_SITE_OTHER): Payer: Medicare Other | Admitting: Family Medicine

## 2015-09-14 ENCOUNTER — Encounter: Payer: Self-pay | Admitting: Family Medicine

## 2015-09-14 VITALS — BP 141/86 | HR 91 | Temp 98.3°F | Ht 64.0 in | Wt 193.6 lb

## 2015-09-14 DIAGNOSIS — R109 Unspecified abdominal pain: Secondary | ICD-10-CM

## 2015-09-14 DIAGNOSIS — R300 Dysuria: Secondary | ICD-10-CM | POA: Insufficient documentation

## 2015-09-14 LAB — POCT UA - MICROSCOPIC ONLY

## 2015-09-14 LAB — POCT URINALYSIS DIPSTICK
Bilirubin, UA: NEGATIVE
Glucose, UA: NEGATIVE
Ketones, UA: NEGATIVE
Leukocytes, UA: NEGATIVE
Nitrite, UA: NEGATIVE
Spec Grav, UA: 1.02
Urobilinogen, UA: 1
pH, UA: 7.5

## 2015-09-14 MED ORDER — CEPHALEXIN 500 MG PO CAPS: 500.0000 mg | ORAL_CAPSULE | Freq: Two times a day (BID) | ORAL | 0 refills | Status: DC

## 2015-09-14 NOTE — Progress Notes (Signed)
DYSURIA Similar symptoms from previous. Symptoms had improved but then was told to stop taking abx. Symptoms recurred.  Pain urinating started 7 days ago. (initial sxs in July Pain is: burning; some lower pelvic pain Medications tried: macrobid Any antibiotics in the last 30 days: macrobid More than 3 UTIs in the last 12 months: no STD exposure: gonorrhea many years ago Possibly pregnant: no  Symptoms Urgency: yes Frequency: yes Blood in urine: possible Pain in back:maybe, possible mild Fever: no Vaginal discharge: some, not new Mouth Ulcers: no  Review of Symptoms - see HPI PMH - Smoking status noted.    CC, SH/smoking status, and VS noted  Objective: BP (!) 141/86   Pulse 91   Temp 98.3 F (36.8 C) (Oral)   Ht 5\' 4"  (1.626 m)   Wt 193 lb 9.6 oz (87.8 kg)   LMP 08/20/2011   BMI 33.23 kg/m  Gen: NAD, alert, cooperative, and pleasant. CV: RRR, no murmur Resp: CTAB, no wheezes, non-labored Abd: S, ND, mild tenderness over the urinary bladder and right flank, BS present, no guarding or organomegaly Ext: No edema, warm Neuro: Alert and oriented, Speech clear, No gross deficits  Assessment and plan:  Dysuria Patient is complaining of dysuria. She has been having this issue for the past few weeks. Initially this was treated with Macrobid. Patient was told to stop taking this medication and symptoms recurred. Although urinalysis looks relatively benign there is continued concern due to the symptomatic improvement with Macrobid. Will treat as UTI for now. - Keflex twice a day 7 days - Stay well-hydrated with water  Next:  If symptoms recur/persist/worsen would discontinue antibiotics and look further into the mild vaginal discharge and patient's reported history of fibroids.   Orders Placed This Encounter  Procedures  . Urinalysis Dipstick    Meds ordered this encounter  Medications  . cephALEXin (KEFLEX) 500 MG capsule    Sig: Take 1 capsule (500 mg total) by  mouth 2 (two) times daily.    Dispense:  14 capsule    Refill:  0     Elberta Leatherwood, MD,MS,  PGY3 09/14/2015 12:46 PM

## 2015-09-14 NOTE — Patient Instructions (Signed)
It was a pleasure seeing you today in our clinic. Today we discussed your pain with urination. Here is the treatment plan we have discussed and agreed upon together:   - Take Keflex twice a day everyday for the next 7 days. - Make sure to stay well-hydrated with water. - Come back and see her PCP if your symptoms persist over the next 1-2 weeks.

## 2015-09-14 NOTE — Assessment & Plan Note (Signed)
Patient is complaining of dysuria. She has been having this issue for the past few weeks. Initially this was treated with Macrobid. Patient was told to stop taking this medication and symptoms recurred. Although urinalysis looks relatively benign there is continued concern due to the symptomatic improvement with Macrobid. Will treat as UTI for now. - Keflex twice a day 7 days - Stay well-hydrated with water  Next:  If symptoms recur/persist/worsen would discontinue antibiotics and look further into the mild vaginal discharge and patient's reported history of fibroids.

## 2015-09-14 NOTE — Addendum Note (Signed)
Addended by: [place] Pink on: 09/14/2015 01:34 PM   Modules accepted: Orders

## 2015-10-18 ENCOUNTER — Other Ambulatory Visit (HOSPITAL_COMMUNITY)
Admission: RE | Admit: 2015-10-18 | Discharge: 2015-10-18 | Disposition: A | Discharge status: Home / Self Care | Payer: Medicare Other | Source: Ambulatory Visit | Attending: Family Medicine | Admitting: Family Medicine

## 2015-10-18 ENCOUNTER — Encounter: Payer: Self-pay | Admitting: Student

## 2015-10-18 ENCOUNTER — Ambulatory Visit (INDEPENDENT_AMBULATORY_CARE_PROVIDER_SITE_OTHER): Payer: Medicare Other | Admitting: Student

## 2015-10-18 ENCOUNTER — Other Ambulatory Visit: Payer: Self-pay | Admitting: Student

## 2015-10-18 VITALS — BP 164/86 | HR 93 | Temp 98.7°F | Ht 64.0 in | Wt 195.0 lb

## 2015-10-18 DIAGNOSIS — M25512 Pain in left shoulder: Secondary | ICD-10-CM

## 2015-10-18 DIAGNOSIS — N898 Other specified noninflammatory disorders of vagina: Secondary | ICD-10-CM | POA: Diagnosis not present

## 2015-10-18 DIAGNOSIS — Z113 Encounter for screening for infections with a predominantly sexual mode of transmission: Secondary | ICD-10-CM | POA: Insufficient documentation

## 2015-10-18 DIAGNOSIS — I10 Essential (primary) hypertension: Secondary | ICD-10-CM

## 2015-10-18 DIAGNOSIS — R109 Unspecified abdominal pain: Secondary | ICD-10-CM

## 2015-10-18 LAB — POCT URINALYSIS DIPSTICK
Bilirubin, UA: NEGATIVE
Glucose, UA: NEGATIVE
Ketones, UA: NEGATIVE
LEUKOCYTES UA: NEGATIVE
NITRITE UA: NEGATIVE
PH UA: 6
Spec Grav, UA: 1.025
Urobilinogen, UA: 0.2

## 2015-10-18 LAB — BASIC METABOLIC PANEL WITH GFR
BUN: 11 mg/dL (ref 7–25)
CALCIUM: 9.8 mg/dL (ref 8.6–10.4)
CO2: 22 mmol/L (ref 20–31)
CREATININE: 0.78 mg/dL (ref 0.50–1.05)
Chloride: 105 mmol/L (ref 98–110)
GFR, Est African American: >89 mL/min (ref >=60)
GFR, Est Non African American: 85 mL/min (ref >=60)
GLUCOSE: 108 mg/dL — AB (ref 65–99)
Potassium: 4 mmol/L (ref 3.5–5.3)
Sodium: 140 mmol/L (ref 135–146)

## 2015-10-18 LAB — POCT WET PREP (WET MOUNT)
Clue Cells Wet Prep Whiff POC: NEGATIVE
Trichomonas Wet Prep HPF POC: ABSENT

## 2015-10-18 MED ORDER — METRONIDAZOLE 500 MG PO TABS: 500.0000 mg | ORAL_TABLET | Freq: Two times a day (BID) | ORAL | 0 refills | Status: AC

## 2015-10-18 MED ORDER — NAPROXEN 500 MG PO TABS: 500.0000 mg | ORAL_TABLET | Freq: Two times a day (BID) | ORAL | 0 refills | Status: DC

## 2015-10-18 NOTE — Progress Notes (Signed)
Subjective:    Patient ID: Yvette Bell is a 60 y.o. old female.  HPI #Vaginal discharge: for two months. She was treated for UTI with antibiotics about a month ago. The vaginal discharge persisited. She had dysuria at that time. Denies itching. Vaginal discharge looks brownish. No blood in it. Got some smell to it. She also reports low abdominal pain that she describes as cramping. Denies back pain. Denies fever, chills, nausea or vomiting. Reports frequent urination which is not new. She is on HCTZ. Denies hematuria or urge.  Not sexually active. Last intercourse about 3 months ago. Used condom. One sexual partner for the last 12 months.  #Left shoulder pain: this has been going on for three weeks. Goes and comes with activity. Pain also when she lies on it at night. Pain at least once daily. Denies trauma or injury. 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 Pain radiates down to her upper arm anteriorly. She describes the pain as cramping pain. Pain about 5-6 on a scale of 10 at its worst. Hasn't tried anything. No alleviating factor except not moving the arm.   PMH: reviewed  SH: Reports smoking cigar occasionally.  Review of Systems Per HPI Objective:   Vitals:   10/18/15 1122  BP: (!) 164/86  Pulse: 93  Temp: 98.7 F (37.1 C)  TempSrc: Oral  Weight: 195 lb (88.5 kg)  Height: 5\' 4"  (1.626 m)    GEN: appears anxious, no apparent distress. CVS: RRR, normal s1 and s2 RESP: no increased work of breathing GU: No suprapubic or CVA tenderness. External genitalia appears normal. Speculum exam remarkable for abundant whitish discharge. No apparent lesion or bleeding. Bimanual exam is normal.  MSK: swelling over the medial end of scapular spine on the left. Mildly tender to palpation over the biceps tendon and supraspinatus muscle on the left. Full range of motion in her neck and both shoulder joints. 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 Barely moves. Not tender to palpation. No overlying skin lesion.      SKIN: Lichenified skin over posterior aspect of the neck. HEM: No cervical LAD NEURO: alert and oriented appropriately, no gross defecits  PSYCH: appropriate mood and affect     Assessment & Plan:  Vaginal discharge Patient with chronic abundant whitish vaginal discharge concerning for bacterial vaginosis. Although her wet prep is negative, I'll treat her as bacterial vaginosis. UA is negative. I have also ordered GC/CT although history doesn't suggest increased risk of STD.   Pain in joint of left shoulder Shoulder exam with mild tenderness over left supraspinatus muscle and left long head biceps tendon suggestive for some tendinitis/soft tissue inflammation. She has full range of motion in her neck and both shoulders. She also had some swelling over medial end of left scapular spine compared to right. A mass of about 2 inch in diameter was also noted over the upper back that barely moves with palpation. I don't think the mass has contribution to her shoulder pain but I can't rule out.  It also didn't feel like a lipoma or cyst on palpation. The mass has not grown in size over the last 4 years according to the patient to think of malignancy. She also doesn't have constitutional symptoms. I discussed this with Dr. Nori Riis, and we have decided to get an MRI of her left shoulder. Patient will get MRI done on 10/2 2017. Meanwhile, I gave her a prescription for  naproxen for pain.   HYPERTENSION, BENIGN SYSTEMIC Patient's blood pressure elevated to 164/86 today. She states she has taken only half of her hydrochlorothiazide due to frequent urination. I have discussed the importance of taking her medication as prescribed. I also noted that her potassium was low to 2.6 about one year ago and she didn't have repeat BMP after that. I have ordered a BMP today and advised her to follow-up on her blood pressure with her PCP  sooner.

## 2015-10-18 NOTE — Assessment & Plan Note (Signed)
Shoulder exam with mild tenderness over left supraspinatus muscle and left long head biceps tendon suggestive for some tendinitis/soft tissue inflammation. She has full range of motion in her neck and both shoulders. She also had some swelling over medial end of left scapular spine compared to right. A mass of about 2 inch in diameter was also noted over the upper back that barely moves with palpation. I don't think the mass has contribution to her shoulder pain but I can't rule out.  It also didn't feel like a lipoma or cyst on palpation. The mass has not grown in size over the last 4 years according to the patient to think of malignancy. She also doesn't have constitutional symptoms. I discussed this with Dr. Nori Riis, and we have decided to get an MRI of her left shoulder. Patient will get MRI done on 10/2 2017. Meanwhile, I gave her a prescription for naproxen for pain.

## 2015-10-18 NOTE — Assessment & Plan Note (Signed)
Patient's blood pressure elevated to 164/86 today. She states she has taken only half of her hydrochlorothiazide due to frequent urination. I have discussed the importance of taking her medication as prescribed. I also noted that her potassium was low to 2.6 about one year ago and she didn't have repeat BMP after that. I have ordered a BMP today and advised her to follow-up on her blood pressure with her PCP sooner.

## 2015-10-18 NOTE — Patient Instructions (Addendum)
It was great seeing you today! We have addressed the following issues today  1. Vaginal discharge: Have sent a prescription for Flagyl to your pharmacy. Please take this until you complete the course 2. Left shoulder pain: I have ordered an MRI of the shoulder. Have also sent a prescription for naproxen to the pharmacy. Take this twice a day as needed for pain. Take it with food.  3.  Hypertension: Blood pressure is 164/86 today. Your goal blood pressure is less 140/90. I recommend follow-up with your primary care doctor on this.    If we did any lab work today, and the results require attention, either me or my nurse will get in touch with you. If everything is normal, you will get a letter in mail. If you don't hear from [place] in two weeks, please give [place] a call. Otherwise, I look forward to talking with you again at our next visit. If you have any questions or concerns before then, please call the clinic at [phone].  Please bring all your medications to every doctors visit   Sign up for My Chart to have easy access to your labs results, and communication with your Primary care physician.    Please check-out at the front desk before leaving the clinic.   Take Care,

## 2015-10-18 NOTE — Assessment & Plan Note (Signed)
Patient with chronic abundant whitish vaginal discharge concerning for bacterial vaginosis. Although her wet prep is negative, I'll treat her as bacterial vaginosis. UA is negative. I have also ordered GC/CT although history doesn't suggest increased risk of STD.

## 2015-10-19 ENCOUNTER — Encounter: Payer: Self-pay | Admitting: Student

## 2015-10-19 LAB — CERVICOVAGINAL ANCILLARY ONLY
Chlamydia: NEGATIVE
NEISSERIA GONORRHEA: NEGATIVE

## 2015-10-19 NOTE — Progress Notes (Signed)
Result letter. BMP, UA, Wet Prep and GC/CT normal

## 2015-10-24 ENCOUNTER — Ambulatory Visit (HOSPITAL_COMMUNITY)
Admission: RE | Admit: 2015-10-24 | Discharge: 2015-10-24 | Disposition: A | Discharge status: Home / Self Care | Payer: Medicare Other | Source: Ambulatory Visit | Attending: Student | Admitting: Student

## 2015-10-24 DIAGNOSIS — M25512 Pain in left shoulder: Secondary | ICD-10-CM | POA: Insufficient documentation

## 2015-10-24 DIAGNOSIS — R109 Unspecified abdominal pain: Secondary | ICD-10-CM | POA: Diagnosis not present

## 2015-10-24 DIAGNOSIS — M779 Enthesopathy, unspecified: Secondary | ICD-10-CM | POA: Insufficient documentation

## 2015-10-24 DIAGNOSIS — M7989 Other specified soft tissue disorders: Secondary | ICD-10-CM | POA: Diagnosis not present

## 2015-10-25 ENCOUNTER — Ambulatory Visit (INDEPENDENT_AMBULATORY_CARE_PROVIDER_SITE_OTHER): Payer: Medicare Other | Admitting: Internal Medicine

## 2015-10-25 ENCOUNTER — Encounter: Payer: Self-pay | Admitting: Internal Medicine

## 2015-10-25 DIAGNOSIS — M25512 Pain in left shoulder: Secondary | ICD-10-CM

## 2015-10-25 DIAGNOSIS — R222 Localized swelling, mass and lump, trunk: Secondary | ICD-10-CM

## 2015-10-25 HISTORY — DX: Localized swelling, mass and lump, trunk: R22.2

## 2015-10-25 NOTE — Progress Notes (Signed)
   Subjective:    Yvette Bell - 60 y.o. female MRN [id number]  Date of birth: June 28, 1955  HPI  Yvette Bell is here for follow up.  Left Shoulder Pain: Present for approximately 4-5 weeks. Occurs with activity. Denies trauma or injury. Pain radiates down her upper arm. Pain has been improved with Naproxen. MRI obtained today (ordered at previous OV).   Mass of Thoracic Paraspinal Region: Present for approximately 4 years. Patient does not believe ist has increased in size. It usually does not cause her discomfort although it occasionally becomes tender. She denies drainage or overlying skin changes. Cosmetically it does bother her.   -  reports that she has never smoked. She has never used smokeless tobacco. - Review of Systems: Per HPI. - Past Medical History: Patient Active Problem List   Diagnosis Date Noted  . Mass on back 10/25/2015  . Vaginal discharge 10/18/2015  . Pain in joint of left shoulder 10/18/2015  . Dysuria 09/14/2015  . UTI (urinary tract infection) 03/04/2015  . Lower abdominal pain 01/22/2015  . Skin sensitivity 01/22/2015  . Rectal bleeding 06/17/2014  . 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 Goiter 02/17/2014  . Right arm pain 11/10/2013  . Superficial foreign body of left index finger 11/10/2013  . Chest pain 06/30/2013  . Rash and nonspecific skin eruption 06/30/2013  . Heart murmur, systolic 0000000  . Cough 05/26/2010  . NECK SPASM 02/02/2010  . ASCUS PAP 11/25/2009  . HYPOKALEMIA 03/30/2009  . OBESITY 01/19/2008  . HYPOTHYROIDISM, HX OF 07/05/2006  . UTERINE FIBROID 03/21/2006  . HYPERTENSION, BENIGN SYSTEMIC 03/21/2006   - Medications: reviewed and updated    Objective:   Physical Exam BP (!) 152/105   Pulse 86   Temp 98.6 F (37 C) (Oral)   Ht 5\' 4"  (1.626 m)   Wt 192 lb 12.8 oz (87.5 kg)   LMP 08/20/2011   BMI 33.09 kg/m  Gen: NAD, alert, cooperative with exam, well-appearing MSK: Minimal edema over the medial  end of scapular spine on the left. Mildly tender to palpation over the biceps tendon and supraspinatus muscle on the left. Full range of motion in her neck and both shoulder joints. Negative Neer's test.  Skin: Approximately 4 by 5 cm circular mass over the left upper thoracic paraspinal region. No TTP. Mass is mobile and not fixed. No overlying skin changes. Lichenified skin over posterior aspect of the neck.     Assessment & Plan:   Pain in joint of left shoulder MRI obtained revealed mild supraspinatus tendinopathy. Recommended patient continue with NSAIDs. Continue with rest and then start exercises to strengthen the shoulder in about 1 week. If pain persists would consider shoulder injection.   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 If increased in size or became painful would warrant further evaluation. Unfortunately, MRI of left shoulder was not able to visualize this area. Would recommend soft tissue US if mass changes. Patient also considering removal for cosmetic reasons. If she chooses removal, would obtain US and refer to general surgery. This plan was established with Dr. Gwendlyn Deutscher.     Phill Myron, D.O. 10/25/2015, 4:05 PM PGY-2, [place]

## 2015-10-25 NOTE — Assessment & Plan Note (Signed)
MRI obtained revealed mild supraspinatus tendinopathy. Recommended patient continue with NSAIDs. Continue with rest and then start exercises to strengthen the shoulder in about 1 week. If pain persists would consider shoulder injection.

## 2015-10-25 NOTE — Patient Instructions (Signed)
EXERCISES  RANGE OF MOTION (ROM) AND STRETCHING EXERCISES These exercises may help you when beginning to rehabilitate your injury. Your symptoms may resolve with or without further involvement from your physician, physical therapist or athletic trainer. While completing these exercises, remember:   Restoring tissue flexibility helps normal motion to return to the joints. This allows healthier, less painful movement and activity.  An effective stretch should be held for at least 30 seconds.  A stretch should never be painful. You should only feel a gentle lengthening or release in the stretched tissue. ROM - Pendulum  Bend at the waist so that your right / left arm falls away from your body. Support yourself with your opposite hand on a solid surface, such as a table or a countertop.  Your right / left arm should be perpendicular to the ground. If it is not perpendicular, you need to lean over farther. Relax the muscles in your right / left arm and shoulder as much as possible.  Gently sway your hips and trunk so they move your right / left arm without any use of your right / left shoulder muscles.  Progress your movements so that your right / left arm moves side to side, then forward and backward, and finally, both clockwise and counterclockwise.  Complete __________ repetitions in each direction. Many people use this exercise to relieve discomfort in their shoulder as well as to gain range of motion. Repeat __________ times. Complete this exercise __________ times per day. STRETCH - Flexion, Standing  Stand with good posture. With an underhand grip on your right / left hand and an overhand grip on the opposite hand, grasp a broomstick or cane so that your hands are a little more than shoulder-width apart.  Keeping your right / left elbow straight and shoulder muscles relaxed, push the stick with your opposite hand to raise your right / left arm in front of your body and then overhead.  Raise your arm until you feel a stretch in your right / left shoulder, but before you have increased shoulder pain.  Try to avoid shrugging your right / left shoulder as your arm rises by keeping your shoulder blade tucked down and toward your mid-back spine. Hold __________ seconds.  Slowly return to the starting position. Repeat __________ times. Complete this exercise __________ times per day. STRETCH - Internal Rotation  Place your right / left hand behind your back, palm-up.  Throw a towel or belt over your opposite shoulder. Grasp the towel/belt with your right / left hand.  While keeping an upright posture, gently pull up on the towel/belt until you feel a stretch in the front of your right / left shoulder.  Avoid shrugging your right / left shoulder as your arm rises by keeping your shoulder blade tucked down and toward your mid-back spine.  Hold __________. Release the stretch by lowering your opposite hand. Repeat __________ times. Complete this exercise __________ times per day. STRETCH - External Rotation and Abduction  Stagger your stance through a doorframe. It does not matter which foot is forward.  As instructed by your physician, physical therapist or athletic trainer, place your hands:  And forearms above your head and on the door frame.  And forearms at head-height and on the door frame.  At elbow-height and on the door frame.  Keeping your head and chest upright and your stomach muscles tight to prevent over-extending your low-back, slowly shift your weight onto your front foot until you feel a stretch  across your chest and/or in the front of your shoulders.  Hold __________ seconds. Shift your weight to your back foot to release the stretch. Repeat __________ times. Complete this stretch __________ times per day.  STRENGTHENING EXERCISES  These exercises may help you when beginning to rehabilitate your injury. They may resolve your symptoms with or without  further involvement from your physician, physical therapist or athletic trainer. While completing these exercises, remember:   Muscles can gain both the endurance and the strength needed for everyday activities through controlled exercises.  Complete these exercises as instructed by your physician, physical therapist or athletic trainer. Progress the resistance and repetitions only as guided.  You may experience muscle soreness or fatigue, but the pain or discomfort you are trying to eliminate should never worsen during these exercises. If this pain does worsen, stop and make certain you are following the directions exactly. If the pain is still present after adjustments, discontinue the exercise until you can discuss the trouble with your clinician.  If advised by your physician, during your recovery, avoid activity or exercises which involve actions that place your right / left hand or elbow above your head or behind your back or head. These positions stress the tissues which are trying to heal. STRENGTH - Scapular Depression and Adduction  With good posture, sit on a firm chair. Supported your arms in front of you with pillows, arm rests or a table top. Have your elbows in line with the sides of your body.  Gently draw your shoulder blades down and toward your mid-back spine. Gradually increase the tension without tensing the muscles along the top of your shoulders and the back of your neck.  Hold for __________ seconds. Slowly release the tension and relax your muscles completely before completing the next repetition.  After you have practiced this exercise, remove the arm support and complete it in standing as well as sitting. Repeat __________ times. Complete this exercise __________ times per day.  STRENGTH - External Rotators  Secure a rubber exercise band/tubing to a fixed object so that it is at the same height as your right / left elbow when you are standing or sitting on a firm  surface.  Stand or sit so that the secured exercise band/tubing is at your side that is not injured.  Bend your elbow 90 degrees. Place a folded towel or small pillow under your right / left arm so that your elbow is a few inches away from your side.  Keeping the tension on the exercise band/tubing, pull it away from your body, as if pivoting on your elbow. Be sure to keep your body steady so that the movement is only coming from your shoulder rotating.  Hold __________ seconds. Release the tension in a controlled manner as you return to the starting position. Repeat __________ times. Complete this exercise __________ times per day.  STRENGTH - Supraspinatus  Stand or sit with good posture. Grasp a __________ weight or an exercise band/tubing so that your hand is "thumbs-up," like when you shake hands.  Slowly lift your right / left hand from your thigh into the air, traveling about 30 degrees from straight out at your side. Lift your hand to shoulder height or as far as you can without increasing any shoulder pain. Initially, many people do not lift their hands above shoulder height.  Avoid shrugging your right / left shoulder as your arm rises by keeping your shoulder blade tucked down and toward your mid-back spine.  Hold for __________ seconds. Control the descent of your hand as you slowly return to your starting position. Repeat __________ times. Complete this exercise __________ times per day.  STRENGTH - Shoulder Extensors  Secure a rubber exercise band/tubing so that it is at the height of your shoulders when you are either standing or sitting on a firm arm-less chair.  With a thumbs-up grip, grasp an end of the band/tubing in each hand. Straighten your elbows and lift your hands straight in front of you at shoulder height. Step back away from the secured end of band/tubing until it becomes tense.  Squeezing your shoulder blades together, pull your hands down to the sides of your  thighs. Do not allow your hands to go behind you.  Hold for __________ seconds. Slowly ease the tension on the band/tubing as you reverse the directions and return to the starting position. Repeat __________ times. Complete this exercise __________ times per day.  STRENGTH - Scapular Retractors  Secure a rubber exercise band/tubing so that it is at the height of your shoulders when you are either standing or sitting on a firm arm-less chair.  With a palm-down grip, grasp an end of the band/tubing in each hand. Straighten your elbows and lift your hands straight in front of you at shoulder height. Step back away from the secured end of band/tubing until it becomes tense.  Squeezing your shoulder blades together, draw your elbows back as you bend them. Keep your upper arm lifted away from your body throughout the exercise.  Hold __________ seconds. Slowly ease the tension on the band/tubing as you reverse the directions and return to the starting position. Repeat __________ times. Complete this exercise __________ times per day. STRENGTH - Scapular Depressors  Find a sturdy chair without wheels, such as a from a dining room table.  Keeping your feet on the floor, lift your bottom from the seat and lock your elbows.  Keeping your elbows straight, allow gravity to pull your body weight down. Your shoulders will rise toward your ears.  Raise your body against gravity by drawing your shoulder blades down your back, shortening the distance between your shoulders and ears. Although your feet should always maintain contact with the floor, your feet should progressively support less body weight as you get stronger.  Hold __________ seconds. In a controlled and slow manner, lower your body weight to begin the next repetition. Repeat __________ times. Complete this exercise __________ times per day.    This information is not intended to replace advice given to you by your health care provider. Make  sure you discuss any questions you have with your health care provider.   Document Released: 11/22/2004 Document Revised: 01/29/2014 Document Reviewed: 04/22/2008 Elsevier Interactive Patient Education 2016 [place] Cuff Tendinitis Rotator cuff tendinitis is inflammation of the tough, cord-like bands that connect muscle to bone (tendons) in your rotator cuff. Your rotator cuff is the collection of all the muscles and tendons that connect your arm to your shoulder. Your rotator cuff holds the head of your upper arm bone (humerus) in the cup (fossa) of your shoulder blade (scapula). CAUSES Rotator cuff tendinitis is usually caused by overusing the joint involved.  SIGNS AND SYMPTOMS  Deep ache in the shoulder also felt on the outside upper arm over the shoulder muscle.  Point tenderness over the area that is injured.  Pain comes on gradually and becomes worse with lifting the arm to the side (abduction) or turning it inward (  internal rotation).  May lead to a chronic tear: When a rotator cuff tendon becomes inflamed, it runs the risk of losing its blood supply, causing some tendon fibers to die. This increases the risk that the tendon can fray and partially or completely tear. DIAGNOSIS Rotator cuff tendinitis is diagnosed by taking a medical history, performing a physical exam, and reviewing results of imaging exams. The medical history is useful to help determine the type of rotator cuff injury. The physical exam will include looking at the injured shoulder, feeling the injured area, and watching you do range-of-motion exercises. X-ray exams are typically done to rule out other causes of shoulder pain, such as fractures. MRI is the imaging exam usually used for significant shoulder injuries. Sometimes a dye study called CT arthrogram is done, but it is not as widely used as MRI. In some institutions, special ultrasound tests may also be used to aid in the diagnosis. TREATMENT  Less  Severe Cases  Use of a sling to rest the shoulder for a short period of time. Prolonged use of the sling can cause stiffness, weakness, and loss of motion of the shoulder joint.  Anti-inflammatory medicines, such as ibuprofen or naproxen sodium, may be prescribed. More Severe Cases  Physical therapy.  Use of steroid injections into the shoulder joint.  Surgery. HOME CARE INSTRUCTIONS   Use a sling or splint until the pain decreases. Prolonged use of the sling can cause stiffness, weakness, and loss of motion of the shoulder joint.  Apply ice to the injured area:  Put ice in a plastic bag.  Place a towel between your skin and the bag.  Leave the ice on for 20 minutes, 2-3 times a day.  Try to avoid use other than gentle range of motion while your shoulder is painful. Use the shoulder and exercise only as directed by your health care provider. Stop exercises or range of motion if pain or discomfort increases, unless directed otherwise by your health care provider.  Only take over-the-counter or prescription medicines for pain, discomfort, or fever as directed by your health care provider.  If you were given a shoulder sling and straps (immobilizer), do not remove it except as directed, or until you see a health care provider for a follow-up exam. If you need to remove it, move your arm as little as possible or as directed.  You may want to sleep on several pillows at night to lessen swelling and pain. SEEK IMMEDIATE MEDICAL CARE IF:   Your shoulder pain increases or new pain develops in your arm, hand, or fingers and is not relieved with medicines.  You have new, unexplained symptoms, especially increased numbness in the hands or loss of strength.  You develop any worsening of the problems that brought you in for care.  Your arm, hand, or fingers are numb or tingling.  Your arm, hand, or fingers are swollen, painful, or turn white or blue. MAKE SURE YOU:  Understand these  instructions.  Will watch your condition.  Will get help right away if you are not doing well or get worse.   This information is not intended to replace advice given to you by your health care provider. Make sure you discuss any questions you have with your health care provider.   Document Released: 03/31/2003 Document Revised: 01/29/2014 Document Reviewed: 08/20/2012 Elsevier Interactive Patient Education [employer].

## 2015-10-25 NOTE — Assessment & Plan Note (Signed)
Most likely lipoma. If increased in size or became painful would warrant further evaluation. Unfortunately, MRI of left shoulder was not able to visualize this area. Would recommend soft tissue US if mass changes. Patient also considering removal for cosmetic reasons. If she chooses removal, would obtain US and refer to general surgery. This plan was established with Dr. Gwendlyn Deutscher.

## 2015-11-17 ENCOUNTER — Telehealth: Payer: Self-pay | Admitting: Internal Medicine

## 2015-11-17 NOTE — Telephone Encounter (Signed)
[employer] form dropped off for at front desk for completion.  Verified that patient section of form has been completed.  Last DOS with PCP was 10-25-15.  Placed form in team folder to be completed by clinical staff.  Rosa A Charlies Constable

## 2015-11-22 NOTE — Telephone Encounter (Signed)
Clinical info completed on Handicap form.  Place form in Dr. Alcario Drought box for completion.  Katharina Caper, April D, [place]

## 2015-11-23 NOTE — Telephone Encounter (Signed)
Patient is aware that form has been completed and placed at the front desk for pick up. Yvette Bell,CMA]

## 2015-11-23 NOTE — Telephone Encounter (Signed)
Reviewed, completed, and signed form.  Note routed to RN team inbasket and placed completed form in Clinic RN's office (wall pocket above desk).  Catherine L Wallace, DO  

## 2015-12-19 ENCOUNTER — Ambulatory Visit (INDEPENDENT_AMBULATORY_CARE_PROVIDER_SITE_OTHER): Payer: Medicare Other | Admitting: Internal Medicine

## 2015-12-19 ENCOUNTER — Encounter: Payer: Self-pay | Admitting: Internal Medicine

## 2015-12-19 VITALS — BP 151/97 | HR 88 | Temp 99.1°F | Ht 64.0 in | Wt 198.6 lb

## 2015-12-19 DIAGNOSIS — N898 Other specified noninflammatory disorders of vagina: Secondary | ICD-10-CM | POA: Diagnosis not present

## 2015-12-19 LAB — POCT UA - MICROSCOPIC ONLY

## 2015-12-19 LAB — POCT URINALYSIS DIPSTICK
BILIRUBIN UA: NEGATIVE
GLUCOSE UA: NEGATIVE
KETONES UA: NEGATIVE
LEUKOCYTES UA: NEGATIVE
Nitrite, UA: NEGATIVE
PROTEIN UA: NEGATIVE
Spec Grav, UA: 1.02
Urobilinogen, UA: 0.2
pH, UA: 6

## 2015-12-19 LAB — POCT WET PREP (WET MOUNT)
CLUE CELLS WET PREP WHIFF POC: NEGATIVE
Trichomonas Wet Prep HPF POC: ABSENT

## 2015-12-19 MED ORDER — FLUCONAZOLE 150 MG PO TABS: 150.0000 mg | ORAL_TABLET | Freq: Once | ORAL | 0 refills | Status: AC

## 2015-12-19 NOTE — Assessment & Plan Note (Addendum)
UA and wet prep negative. However, exam appeared consistent with yeast infection and patient has received several courses of antibiotics in the past few months. Will treat for yeast infection with Diflucan. Patient to return if Diflucan does not improve her symptoms.

## 2015-12-19 NOTE — Progress Notes (Signed)
   Subjective:    Yvette Bell - 59 y.o. female MRN [id number]  Date of birth: 1955-11-03  HPI  Yvette Bell is here for SDA for vaginal discharge.   Vaginal Discharge: Present for several months. Has been treated for UTI with two courses of antibiotics in the past few months. Has also been treated empirically for BV recently. Discharge is white and clumpy sometimes with a green tinge. She reports some lower suprapubic pain and foul smelling, itchy discharge. She denies dysuria. Denies pelvic pain.   -  reports that she has never smoked. She has never used smokeless tobacco. - Review of Systems: Per HPI. - Past Medical History: Patient Active Problem List   Diagnosis Date Noted  . Mass on back 10/25/2015  . Vaginal discharge 10/18/2015  . Pain in joint of left shoulder 10/18/2015  . Dysuria 09/14/2015  . UTI (urinary tract infection) 03/04/2015  . Lower abdominal pain 01/22/2015  . Skin sensitivity 01/22/2015  . Rectal bleeding 06/17/2014  . 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 Goiter 02/17/2014  . Right arm pain 11/10/2013  . Superficial foreign body of left index finger 11/10/2013  . Chest pain 06/30/2013  . Rash and nonspecific skin eruption 06/30/2013  . Heart murmur, systolic 0000000  . Cough 05/26/2010  . NECK SPASM 02/02/2010  . ASCUS PAP 11/25/2009  . HYPOKALEMIA 03/30/2009  . OBESITY 01/19/2008  . HYPOTHYROIDISM, HX OF 07/05/2006  . UTERINE FIBROID 03/21/2006  . HYPERTENSION, BENIGN SYSTEMIC 03/21/2006   - Medications: reviewed and updated   Objective:   Physical Exam BP (!) 151/97   Pulse 88   Temp 99.1 F (37.3 C) (Oral)   Ht 5\' 4"  (1.626 m)   Wt 198 lb 9.6 oz (90.1 kg)   LMP 08/20/2011   SpO2 99%   BMI 34.09 kg/m  Gen: NAD, alert, cooperative with exam, well-appearing GU/GYN: Exam performed in the presence of a chaperone. External genitalia within normal limits.  Vaginal mucosa pink, moist, normal rugae.  Nonfriable cervix  without lesions or bleeding noted on speculum exam. Thick, white discharge present in the posterior fornix. Bimanual exam revealed normal, nongravid uterus.  No cervical motion tenderness. No adnexal masses bilaterally.      Assessment & Plan:   Vaginal discharge UA and wet prep negative. However, exam appeared consistent with yeast infection and patient has received several courses of antibiotics in the past few months. Will treat for yeast infection with Diflucan. Patient to return if Diflucan does not improve her symptoms.     Phill Myron, D.O. 12/19/2015, 1:34 PM PGY-2, [place]

## 2016-01-30 ENCOUNTER — Other Ambulatory Visit: Payer: Self-pay | Admitting: Family Medicine

## 2016-01-30 DIAGNOSIS — Z1231 Encounter for screening mammogram for malignant neoplasm of breast: Secondary | ICD-10-CM

## 2016-02-23 ENCOUNTER — Ambulatory Visit
Admission: RE | Admit: 2016-02-23 | Discharge: 2016-02-23 | Disposition: A | Discharge status: Home / Self Care | Payer: Medicare Other | Source: Ambulatory Visit | Attending: Family Medicine | Admitting: Family Medicine

## 2016-02-23 DIAGNOSIS — Z1231 Encounter for screening mammogram for malignant neoplasm of breast: Secondary | ICD-10-CM

## 2016-03-22 ENCOUNTER — Ambulatory Visit: Payer: Medicare Other | Admitting: Internal Medicine

## 2016-03-29 ENCOUNTER — Other Ambulatory Visit: Payer: Self-pay | Admitting: Internal Medicine

## 2016-03-29 DIAGNOSIS — I10 Essential (primary) hypertension: Secondary | ICD-10-CM

## 2016-07-12 ENCOUNTER — Encounter (HOSPITAL_COMMUNITY): Payer: Self-pay

## 2016-07-12 ENCOUNTER — Emergency Department (HOSPITAL_COMMUNITY)
Admission: EM | Admit: 2016-07-12 | Discharge: 2016-07-13 | Disposition: A | Discharge status: Home / Self Care | Payer: Medicare Other | Attending: Emergency Medicine | Admitting: Emergency Medicine

## 2016-07-12 DIAGNOSIS — I10 Essential (primary) hypertension: Secondary | ICD-10-CM | POA: Insufficient documentation

## 2016-07-12 DIAGNOSIS — R350 Frequency of micturition: Secondary | ICD-10-CM | POA: Insufficient documentation

## 2016-07-12 DIAGNOSIS — M549 Dorsalgia, unspecified: Secondary | ICD-10-CM | POA: Diagnosis not present

## 2016-07-12 DIAGNOSIS — Z79899 Other long term (current) drug therapy: Secondary | ICD-10-CM | POA: Diagnosis not present

## 2016-07-12 DIAGNOSIS — J45909 Unspecified asthma, uncomplicated: Secondary | ICD-10-CM | POA: Diagnosis not present

## 2016-07-12 DIAGNOSIS — M79605 Pain in left leg: Secondary | ICD-10-CM | POA: Diagnosis not present

## 2016-07-12 LAB — URINALYSIS, ROUTINE W REFLEX MICROSCOPIC
Bilirubin Urine: NEGATIVE
Glucose, UA: NEGATIVE mg/dL
Ketones, ur: NEGATIVE mg/dL
Leukocytes, UA: NEGATIVE
Nitrite: NEGATIVE
PROTEIN: 30 mg/dL — AB
Specific Gravity, Urine: 1.031 — ABNORMAL HIGH (ref 1.005–1.030)
pH: 5 (ref 5.0–8.0)

## 2016-07-12 NOTE — ED Notes (Signed)
Pt c/o 4/10 numb pain in the left lower back with a hx of back muscle sprain, urinary frequency, 5/10 pain in the left leg distal to the patella. Denies loss of bowels.

## 2016-07-12 NOTE — ED Triage Notes (Signed)
Pt c/o frequent urination and flank pain for the last 2 weeks. She endorses malodorous urine. Denies N/V/D/F A&Ox4. Ambulatory.

## 2016-07-13 DIAGNOSIS — R350 Frequency of micturition: Secondary | ICD-10-CM | POA: Diagnosis not present

## 2016-07-13 MED ORDER — CEPHALEXIN 500 MG PO CAPS: 500.0000 mg | ORAL_CAPSULE | Freq: Two times a day (BID) | ORAL | 0 refills | Status: DC

## 2016-07-13 MED ORDER — CEPHALEXIN 500 MG PO CAPS
1000.0000 mg | ORAL_CAPSULE | Freq: Once | ORAL | Status: AC
  Administered 2016-07-13: 1000 mg via ORAL
  Filled 2016-07-13: qty 2

## 2016-07-13 NOTE — ED Provider Notes (Signed)
[facility] DEPT Provider Note: Georgena Spurling, MD, FACEP  CSN: [id number] MRN: [id number] ARRIVAL: 07/12/16 at 2025 ROOM: WA04/WA04   CHIEF COMPLAINT  Urinary Frequency   HISTORY OF PRESENT ILLNESS  Yvette Bell is a 61 y.o. female with a two-week history of urinary frequency. This is been associated with lower midline back pain for the same amount of time. She has a sensation of incomplete voiding but she denies actual burning with urination. She is not having pain in her flanks. She denies fever, chills, nausea, vomiting or diarrhea. She has had urinary tract infections in the past. She rates her back pain as a 4 out of 10. It is not significantly changed with movement. She is also having pain about her left fibular head. She denies injury.   Past Medical History:  Diagnosis Date  . Allergy   . Anxiety   . Arthritis   . Asthma   . Cataract    bil eyes  . Depression   . Fibroid tumor   . GERD (gastroesophageal reflux disease)   . Hyperlipidemia   . Hypertension   . Thyroid disease    hyperactive    Past Surgical History:  Procedure Laterality Date  . cesearean section    . HERNIA REPAIR      Family History  Problem Relation Age of Onset  . Colon cancer Neg Hx   . Esophageal cancer Neg Hx   . Gallbladder disease Neg Hx   . Rectal cancer Neg Hx   . Colon polyps Sister   . Diabetes Sister   . Stomach cancer Father     Social History  Substance Use Topics  . Smoking status: Never Smoker  . Smokeless tobacco: Never Used  . Alcohol use 0.0 oz/week     Comment: Occassionally    Prior to Admission medications   Medication Sig Start Date End Date Taking? Authorizing Provider  hydrochlorothiazide (HYDRODIURIL) 25 MG tablet TAKE ONE TABLET BY MOUTH DAILY AT BEDTIME. Patient taking differently: TAKE 25 MG BY MOUTH DAILY AT BEDTIME. 03/30/16  Yes Nicolette Bang, DO  naproxen (NAPROSYN) 500 MG tablet Take 1 tablet (500 mg total) by mouth 2 (two) times  daily with a meal. Patient taking differently: Take 500 mg by mouth 2 (two) times daily as needed for mild pain or moderate pain.  10/18/15  Yes Mercy Riding, MD    Allergies Morphine and Pollen extract   REVIEW OF SYSTEMS  Negative except as noted here or in the History of Present Illness.   PHYSICAL EXAMINATION  Initial Vital Signs Blood pressure (!) 161/101, pulse (!) 101, temperature 99.2 F (37.3 C), temperature source Oral, resp. rate 16, height 5\' 5"  (1.651 m), weight 86.2 kg (190 lb), last menstrual period 08/20/2011, SpO2 100 %.  Examination General: Well-developed, well-nourished female in no acute distress; appearance consistent with age of record HENT: normocephalic; atraumatic Eyes: pupils equal, round and reactive to light; extraocular muscles intact Neck: supple Heart: regular rate and rhythm Lungs: clear to auscultation bilaterally Abdomen: soft; nondistended; nontender; no masses or hepatosplenomegaly; bowel sounds present GU: No CVA tenderness Extremities: No deformity; full range of motion; pulses normal; tenderness over left fibular head without swelling, erythema or warmth Neurologic: Awake, alert and oriented; motor function intact in all extremities and symmetric; no facial droop Skin: Warm and dry Psychiatric: Normal mood and affect   RESULTS  Summary of this visit's results, reviewed by myself:   EKG Interpretation  Date/Time:  Ventricular Rate:    PR Interval:    QRS Duration:   QT Interval:    QTC Calculation:   R Axis:     Text Interpretation:        Laboratory Studies: Results for orders placed or performed during the hospital encounter of 07/12/16 (from the past 24 hour(s))  Urinalysis, Routine w reflex microscopic- may I&O cath if menses     Status: Abnormal   Collection Time: 07/12/16  9:56 PM  Result Value Ref Range   Color, Urine YELLOW YELLOW   APPearance CLOUDY (A) CLEAR   Specific Gravity, Urine 1.031 (H) 1.005 - 1.030    pH 5.0 5.0 - 8.0   Glucose, UA NEGATIVE NEGATIVE mg/dL   Hgb urine dipstick LARGE (A) NEGATIVE   Bilirubin Urine NEGATIVE NEGATIVE   Ketones, ur NEGATIVE NEGATIVE mg/dL   Protein, ur 30 (A) NEGATIVE mg/dL   Nitrite NEGATIVE NEGATIVE   Leukocytes, UA NEGATIVE NEGATIVE   RBC / HPF 6-30 0 - 5 RBC/hpf   WBC, UA 6-30 0 - 5 WBC/hpf   Bacteria, UA RARE (A) NONE SEEN   Squamous Epithelial / LPF 0-5 (A) NONE SEEN   Mucous PRESENT    Ca Oxalate Crys, UA PRESENT    Imaging Studies: No results found.  ED COURSE  Nursing notes and initial vitals signs, including pulse oximetry, reviewed.  Vitals:   07/12/16 2116 07/13/16 0038  BP: (!) 161/101 138/88  Pulse: (!) 101 73  Resp: 16 17  Temp: 99.2 F (37.3 C) 98.7 F (37.1 C)  TempSrc: Oral Oral  SpO2: 100% 98%  Weight: 86.2 kg (190 lb)   Height: 5\' 5"  (1.651 m)    12:39 AM We'll treat for urinary tract infection even though her nitrites and leukocytes are negative. Her symptomatology and microscopic are suggestive of a UTI. The pain in her left leg appears to be in the vicinity of the common peroneal nerve. The cause of this pain is unclear. She was advised that over-the-counter lidocaine patches may help.  PROCEDURES    ED DIAGNOSES     ICD-10-CM   1. Urinary frequency R35.0        Jen Benedict, Jenny Reichmann, MD 07/13/16 [phone]

## 2016-07-14 LAB — URINE CULTURE

## 2016-07-31 ENCOUNTER — Ambulatory Visit (INDEPENDENT_AMBULATORY_CARE_PROVIDER_SITE_OTHER): Payer: Medicare Other | Admitting: Family Medicine

## 2016-07-31 ENCOUNTER — Encounter: Payer: Self-pay | Admitting: Family Medicine

## 2016-07-31 VITALS — BP 164/98 | HR 74 | Temp 99.0°F | Ht 65.0 in | Wt 191.8 lb

## 2016-07-31 DIAGNOSIS — L700 Acne vulgaris: Secondary | ICD-10-CM | POA: Diagnosis present

## 2016-07-31 DIAGNOSIS — Z91018 Allergy to other foods: Secondary | ICD-10-CM | POA: Diagnosis not present

## 2016-07-31 HISTORY — DX: Acne vulgaris: L70.0

## 2016-07-31 MED ORDER — EPINEPHRINE 0.3 MG/0.3ML IJ SOAJ: 0.3000 mg | Freq: Once | INTRAMUSCULAR | Status: DC

## 2016-07-31 MED ORDER — EPINEPHRINE 0.3 MG/0.3ML IJ SOAJ: 0.3000 mg | Freq: Once | INTRAMUSCULAR | 0 refills | Status: AC

## 2016-07-31 NOTE — Assessment & Plan Note (Signed)
  Patient with long history of environmental allergies but concerned for new food allergy as she has noticed some upper lip swelling on occasion. Has cut out foods but unsure what the culprit is. No ACE. No trouble breathing with lip swelling  -recommended antihistamine as needed -rx given for epi pen  -referral made to allergist for food testing per patient preference

## 2016-07-31 NOTE — Progress Notes (Signed)
    Subjective:    Patient ID: Yvette Bell, female    DOB: 1955-06-09, 61 y.o.   MRN: [id number]   CC: mole on back itches  Has had itchy spot on her back for past several days. She noticed a dark spot in the mirror and thinks it's a mole, unsure if it has been there. Denies bleeding, drainage.   Also reports environmental and food allergies but lately she is concerned because her top lip will swell randomly. She has cut out a lot of different foods but this continues to happen. Concerned she is allergic to something. She denies any trouble breathing with these episodes, no tongue swelling. She is interested in allergy testing.  Smoking status reviewed- non-smoker  Review of Systems- see HPI   Objective:  BP (!) 164/98   Pulse 74   Temp 99 F (37.2 C) (Oral)   Ht 5\' 5"  (1.651 m)   Wt 191 lb 12.8 oz (87 kg)   LMP 08/20/2011   SpO2 99%   BMI 31.92 kg/m  Vitals and nursing note reviewed  General: well nourished, in no acute distress HEENT: normocephalic, moist mucous membranes, good dentition without erythema or discharge noted in posterior oropharynx Cardiac: RRR, clear S1 and S2, no murmurs, rubs, or gallops Respiratory: clear to auscultation bilaterally, no increased work of breathing Skin: warm and dry. Small raised open comedone midline of back under bra strap with small amount of purulent material surrounding. No induration. No swelling or erythema.  Neuro: alert and oriented, no focal deficits   Assessment & Plan:    Blackhead  Blackhead located midback under bra strap, has come to a head. Minimal drainage expressed in office. Covered with antibacterial ointment and bandaid.  -advised patient to clean with soap and water daily, use warm compresses if needed -return if area swells or has new drainage -follow up as needed   Food allergy  Patient with long history of environmental allergies but concerned for new food allergy as she has noticed some upper lip  swelling on occasion. Has cut out foods but unsure what the culprit is. No ACE. No trouble breathing with lip swelling  -recommended antihistamine as needed -rx given for epi pen  -referral made to allergist for food testing per patient preference   Return if symptoms worsen or fail to improve.   Lucila Maine, DO Family Medicine Resident PGY-2

## 2016-07-31 NOTE — Assessment & Plan Note (Addendum)
  Blackhead located midback under bra strap, has come to a head. Minimal drainage expressed in office. Covered with antibacterial ointment and bandaid.  -advised patient to clean with soap and water daily, use warm compresses if needed -return if area swells or has new drainage -follow up as needed

## 2016-07-31 NOTE — Patient Instructions (Signed)
   It was great seeing you today!  Please return to be seen if you develop swelling or drainage from the area on your back.  You will hear from our referral department about getting in to see an Allergy Doctor for allergy testing.    If you have questions or concerns please do not hesitate to call at [phone].  Lucila Maine, DO PGY-2, [facility] 07/31/2016 2:45 PM

## 2016-08-27 ENCOUNTER — Encounter (HOSPITAL_COMMUNITY): Payer: Self-pay | Admitting: Emergency Medicine

## 2016-08-27 ENCOUNTER — Emergency Department (HOSPITAL_COMMUNITY)
Admission: EM | Admit: 2016-08-27 | Discharge: 2016-08-27 | Disposition: A | Discharge status: Home / Self Care | Payer: Medicare Other | Attending: Emergency Medicine | Admitting: Emergency Medicine

## 2016-08-27 DIAGNOSIS — I10 Essential (primary) hypertension: Secondary | ICD-10-CM | POA: Insufficient documentation

## 2016-08-27 DIAGNOSIS — R220 Localized swelling, mass and lump, head: Secondary | ICD-10-CM | POA: Diagnosis present

## 2016-08-27 DIAGNOSIS — K149 Disease of tongue, unspecified: Secondary | ICD-10-CM | POA: Diagnosis not present

## 2016-08-27 MED ORDER — MAGIC MOUTHWASH W/LIDOCAINE: ORAL | 0 refills | Status: DC

## 2016-08-27 NOTE — ED Triage Notes (Signed)
Per EMS, pt from home.  Pt c/o weakness x 3 days.  Pt feels swelling under her tongue.  Nausea with no vomiting.  No fever.  Pt is ambulatory.   Vitals: 148/92, hr 78, 98%, resp 18, cbg 112

## 2016-08-27 NOTE — Discharge Instructions (Signed)
Return here as needed.  Follow-up with the, Dr. provided as needed

## 2016-08-27 NOTE — ED Triage Notes (Signed)
Pt c/o gradually progressive tongue swelling and lightheadedness x 1 week. No SOB. In control of oral secretions, voice quality normal. Airway clear. Only antihypertensive is HCTZ.

## 2016-08-27 NOTE — ED Notes (Signed)
Pt ambulatory and independent at discharge.  Verbalized understanding of discharge instructions 

## 2016-08-27 NOTE — ED Provider Notes (Signed)
[facility] DEPT Provider Note   CSN: [id number] Arrival date & time: 08/27/16  1356     History   Chief Complaint Chief Complaint  Patient presents with  . Oral Swelling    Small distinct areas of swelling on tongue  . Weakness    HPI Yvette Bell is a 60 y.o. female.  HPI Patient presents to the emergency department with irritation at the end of her tongue.  Patient states this started one week ago.  She states that it is not necessarily pain, but just irritation.  She states that she feels like that he area has more prominence to within normal.  Patient states she does not have any new medications and any foodsThe patient denies chest pain, shortness of breath, headache,blurred vision, neck pain, fever, cough, weakness, numbness, dizziness, anorexia, edema, abdominal pain, nausea, vomiting, diarrhea, rash, back pain, dysuria, hematemesis, bloody stool, near syncope, or syncope. Past Medical History:  Diagnosis Date  . Allergy   . Anxiety   . Arthritis   . Asthma   . Cataract    bil eyes  . Depression   . Fibroid tumor   . GERD (gastroesophageal reflux disease)   . Hyperlipidemia   . Hypertension   . Thyroid disease    hyperactive    Patient Active Problem List   Diagnosis Date Noted  . Food allergy 07/31/2016  . Blackhead 07/31/2016  . Mass on back 10/25/2015  . UTI (urinary tract infection) 03/04/2015  . Skin sensitivity 01/22/2015  . Rectal bleeding 06/17/2014  . 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 Goiter 02/17/2014  . Heart murmur, systolic 73/41/9379  . NECK SPASM 02/02/2010  . ASCUS PAP 11/25/2009  . HYPOKALEMIA 03/30/2009  . OBESITY 01/19/2008  . HYPOTHYROIDISM, HX OF 07/05/2006  . UTERINE FIBROID 03/21/2006  . HYPERTENSION, BENIGN SYSTEMIC 03/21/2006    Past Surgical History:  Procedure Laterality Date  . cesearean section    . HERNIA REPAIR      OB History    No data available       Home Medications    Prior  to Admission medications   Medication Sig Start Date End Date Taking? Authorizing Provider  hydrochlorothiazide (HYDRODIURIL) 25 MG tablet TAKE ONE TABLET BY MOUTH DAILY AT BEDTIME. Patient taking differently: TAKE 25 MG BY MOUTH DAILY AT BEDTIME. 03/30/16   Nicolette Bang, DO  naproxen (NAPROSYN) 500 MG tablet Take 1 tablet (500 mg total) by mouth 2 (two) times daily with a meal. Patient taking differently: Take 500 mg by mouth 2 (two) times daily as needed for mild pain or moderate pain.  10/18/15   Mercy Riding, MD    Family History Family History  Problem Relation Age of Onset  . Colon cancer Neg Hx   . Esophageal cancer Neg Hx   . Gallbladder disease Neg Hx   . Rectal cancer Neg Hx   . Colon polyps Sister   . Diabetes Sister   . Stomach cancer Father     Social History Social History  Substance Use Topics  . Smoking status: Never Smoker  . Smokeless tobacco: Never Used  . Alcohol use 0.0 oz/week     Comment: Occassionally     Allergies   Morphine and Pollen extract   Review of Systems Review of Systems All other systems negative except as documented in the HPI. All pertinent positives and negatives as reviewed in the HPI.  Physical Exam Updated Vital Signs BP (!) 161/85 (BP  Location: Left Arm)   Pulse 79   Temp 98.1 F (36.7 C) (Oral)   Resp 12   Ht 5\' 5"  (1.651 m)   Wt 86.2 kg (190 lb)   LMP 08/20/2011   SpO2 100%   BMI 31.62 kg/m   Physical Exam  Constitutional: She is oriented to person, place, and time. She appears well-developed and well-nourished. No distress.  HENT:  Head: Normocephalic and atraumatic.  Mouth/Throat:    Eyes: Pupils are equal, round, and reactive to light.  Pulmonary/Chest: Effort normal.  Neurological: She is alert and oriented to person, place, and time.  Skin: Skin is warm and dry.  Psychiatric: She has a normal mood and affect.  Nursing note and vitals reviewed.    ED Treatments / Results  Labs (all labs  ordered are listed, but only abnormal results are displayed) Labs Reviewed - No data to display  EKG  EKG Interpretation None       Radiology No results found.  Procedures Procedures (including critical care time)  Medications Ordered in ED Medications - No data to display   Initial Impression / Assessment and Plan / ED Course  I have reviewed the triage vital signs and the nursing notes.  Pertinent labs & imaging results that were available during my care of the patient were reviewed by me and considered in my medical decision making (see chart for details).     Patient has what I would consider a mild irritation of the proximal third of tongue will give Magic mouthwash.  Told to follow up with ENT as needed  Final Clinical Impressions(s) / ED Diagnoses   Final diagnoses:  None    New Prescriptions New Prescriptions   No medications on file     Dalia Heading, Hershal Coria 08/29/16 Andrew Au, MD 08/29/16 [phone]

## 2016-09-06 ENCOUNTER — Ambulatory Visit (INDEPENDENT_AMBULATORY_CARE_PROVIDER_SITE_OTHER): Payer: Medicare Other | Admitting: Allergy & Immunology

## 2016-09-06 ENCOUNTER — Encounter: Payer: Self-pay | Admitting: Allergy & Immunology

## 2016-09-06 VITALS — BP 128/78 | HR 85 | Temp 99.5°F | Resp 19 | Ht 65.0 in | Wt 191.0 lb

## 2016-09-06 DIAGNOSIS — J301 Allergic rhinitis due to pollen: Secondary | ICD-10-CM | POA: Insufficient documentation

## 2016-09-06 DIAGNOSIS — T781XXD Other adverse food reactions, not elsewhere classified, subsequent encounter: Secondary | ICD-10-CM

## 2016-09-06 DIAGNOSIS — J3089 Other allergic rhinitis: Secondary | ICD-10-CM | POA: Diagnosis not present

## 2016-09-06 DIAGNOSIS — J302 Other seasonal allergic rhinitis: Secondary | ICD-10-CM

## 2016-09-06 DIAGNOSIS — J4520 Mild intermittent asthma, uncomplicated: Secondary | ICD-10-CM | POA: Diagnosis not present

## 2016-09-06 MED ORDER — FLUTICASONE PROPIONATE 50 MCG/ACT NA SUSP: 2.0000 | Freq: Every day | NASAL | 5 refills | Status: DC

## 2016-09-06 NOTE — Patient Instructions (Addendum)
1. Mild intermittent asthma, uncomplicated - Lung testing looked great today. - There is no need for a controller medication at this time.  - Continue with albuterol four puffs every 4-6 hours as needed.  2. Chronic rhinitis - Testing today showed: weeds, grasses, molds, dust mites, dog and cockroach - Avoidance measures provided. - Start Zyrtec (cetirizine) 10mg  tablet once daily and Flonase (fluticasone) two sprays per nostril daily - You can use an extra dose of the antihistamine, if needed, for breakthrough symptoms.  - Consider nasal saline rinses 1-2 times daily to remove allergens from the nasal cavities as well as help with mucous clearance (this is especially helpful to do before the nasal sprays are given) - Consider allergy shots as a means of long-term control. - Allergy shots "re-train" the immune system to ignore environmental allergens and decrease the resulting immune response to those allergens (sneezing, itchy watery eyes, runny nose, nasal congestion, etc).   - We can discuss more at the next appointment if the medications are not working for you.  3. Adverse food reaction - Testing showed: negative to Peanut, Soy, Wheat, Corn, Milk, Egg, Casein, Shellfish Mix, Fish Mix, Cashew, [place], [place], Chicken, [place], [place], [place], [place], [place], [place], [place], [place] nut, Coconut, Garlic and Black Pepper  - We will send blood work for sunflower seed and tumeric.  - Continue to keep a diary of any foods you eat and the reactions that you have.   4. Return in about 3 months (around 12/07/2016).   Please inform us of any Emergency Department visits, hospitalizations, or changes in symptoms. Call us before going to the ED for breathing or allergy symptoms since we might be able to fit you in for a sick visit. Feel free to contact us anytime with any questions, problems, or concerns.  It was a pleasure to meet you today! Enjoy the rest of your summer!   Websites that have  reliable patient information: 1. American Academy of Asthma, Allergy, and Immunology: www.aaaai.org 2. Food Allergy Research and Education (FARE): foodallergy.org 3. Mothers of Asthmatics: http://www.asthmacommunitynetwork.org 4. American College of Allergy, Asthma, and Immunology: www.acaai.org   Election Day is coming up on Tuesday, November 6th! Make your voice heard! Register to vote at [email or web address].lewis.[email or web address]!     Reducing Pollen Exposure  The American Academy of Allergy, Asthma and Immunology suggests the following steps to reduce your exposure to pollen during allergy seasons.    1. Do not hang sheets or clothing out to dry; pollen may collect on these items. 2. Do not mow lawns or spend time around freshly cut grass; mowing stirs up pollen. 3. Keep windows closed at night.  Keep car windows closed while driving. 4. Minimize morning activities outdoors, a time when pollen counts are usually at their highest. 5. Stay indoors as much as possible when pollen counts or humidity is high and on windy days when pollen tends to remain in the air longer. 6. Use air conditioning when possible.  Many air conditioners have filters that trap the pollen spores. 7. Use a HEPA room air filter to remove pollen form the indoor air you breathe.  Control of Mold Allergen  Mold and fungi can grow on a variety of surfaces provided certain temperature and moisture conditions exist.  Outdoor molds grow on plants, decaying vegetation and soil.  The major outdoor mold, Alternaria and Cladosporium, are found in very high numbers during hot and dry conditions.  Generally, a late Summer - Fall peak is seen for  common outdoor fungal spores.  Rain will temporarily lower outdoor mold spore count, but counts rise rapidly when the rainy period ends.  The most important indoor molds are Aspergillus and Penicillium.  Dark, humid and poorly ventilated basements are ideal sites for mold growth.  The next most common sites of mold growth  are the bathroom and the kitchen.  Outdoor [employer] 1. Use air conditioning and keep windows closed 2. Avoid exposure to decaying vegetation. 3. Avoid leaf raking. 4. Avoid grain handling. 5. Consider wearing a face mask if working in moldy areas.  Indoor Mold Control 1. Maintain humidity below 50%. 2. Clean washable surfaces with 5% bleach solution. 3. Remove sources e.g. contaminated carpets.  Control of Dog or Cat Allergen  Avoidance is the best way to manage a dog or cat allergy. If you have a dog or cat and are allergic to dog or cats, consider removing the dog or cat from the home. If you have a dog or cat but don't want to find it a new home, or if your family wants a pet even though someone in the household is allergic, here are some strategies that may help keep symptoms at bay:  1. Keep the pet out of your bedroom and restrict it to only a few rooms. Be advised that keeping the dog or cat in only one room will not limit the allergens to that room. 2. Don't pet, hug or kiss the dog or cat; if you do, wash your hands with soap and water. 3. High-efficiency particulate air (HEPA) cleaners run continuously in a bedroom or living room can reduce allergen levels over time. 4. Regular use of a high-efficiency vacuum cleaner or a central vacuum can reduce allergen levels. 5. Giving your dog or cat a bath at least once a week can reduce airborne allergen.  Control of Cockroach Allergen  Cockroach allergen has been identified as an important cause of acute attacks of asthma, especially in urban settings.  There are fifty-five species of cockroach that exist in the [place], however only three, the [place], [place] species produce allergen that can affect patients with Asthma.  Allergens can be obtained from fecal particles, egg casings and secretions from cockroaches.    1. Remove food sources. 2. Reduce access to water. 3. Seal access and entry points. 4. Spray  runways with 0.5-1% Diazinon or Chlorpyrifos 5. Blow boric acid power under stoves and refrigerator. 6. Place bait stations (hydramethylnon) at feeding sites.  Control of House Dust Mite Allergen    House dust mites play a major role in allergic asthma and rhinitis.  They occur in environments with high humidity wherever human skin, the food for dust mites is found. High levels have been detected in dust obtained from mattresses, pillows, carpets, upholstered furniture, bed covers, clothes and soft toys.  The principal allergen of the house dust mite is found in its feces.  A gram of dust may contain 1,000 mites and 250,000 fecal particles.  Mite antigen is easily measured in the air during house cleaning activities.    1. Encase mattresses, including the box spring, and pillow, in an air tight cover.  Seal the zipper end of the encased mattresses with wide adhesive tape. 2. Wash the bedding in water of 130 degrees Farenheit weekly.  Avoid cotton comforters/quilts and flannel bedding: the most ideal bed covering is the dacron comforter. 3. Remove all upholstered furniture from the bedroom. 4. Remove carpets, carpet padding, rugs, and  non-washable window drapes from the bedroom.  Wash drapes weekly or use plastic window coverings. 5. Remove all non-washable stuffed toys from the bedroom.  Wash stuffed toys weekly. 6. Have the room cleaned frequently with a vacuum cleaner and a damp dust-mop.  The patient should not be in a room which is being cleaned and should wait 1 hour after cleaning before going into the room. 7. Close and seal all heating outlets in the bedroom.  Otherwise, the room will become filled with dust-laden air.  An electric heater can be used to heat the room. 8. Reduce indoor humidity to less than 50%.  Do not use a humidifier.

## 2016-09-06 NOTE — Progress Notes (Signed)
NEW PATIENT  Date of Service/Encounter:  09/06/16  Referring provider: Nicolette Bang, DO   Assessment:   Mild intermittent asthma, uncomplicated  Seasonal and perennial allergic rhinitis (weeds, grasses, molds, dust mites, dog and cockroach)  Adverse food reaction - with negative testing today  Asthma Reportables:  Severity: intermittent  Risk: low Control: well controlled  Plan/Recommendations:   1. Mild intermittent asthma, uncomplicated - Lung testing looked great today. - There is no need for a controller medication at this time.  - Continue with albuterol four puffs every 4-6 hours as needed.  2. Chronic rhinitis - Testing today showed: weeds, grasses, molds, dust mites, dog and cockroach - Avoidance measures provided. - Start Zyrtec (cetirizine) 10mg  tablet once daily and Flonase (fluticasone) two sprays per nostril daily - You can use an extra dose of the antihistamine, if needed, for breakthrough symptoms.  - Consider nasal saline rinses 1-2 times daily to remove allergens from the nasal cavities as well as help with mucous clearance (this is especially helpful to do before the nasal sprays are given) - Consider allergy shots as a means of long-term control. - Allergy shots "re-train" the immune system to ignore environmental allergens and decrease the resulting immune response to those allergens (sneezing, itchy watery eyes, runny nose, nasal congestion, etc).   - We can discuss more at the next appointment if the medications are not working for you.  3. Adverse food reaction - with negative testing (tumeric and sunflower IgE pending) - Testing showed: negative to Peanut, Soy, Wheat, Corn, Milk, Egg, Casein, Shellfish Mix, Fish Mix, Cashew, [place], [place], Chicken, [place], [place], [place], [place], [place], [place], [place], [place] nut, Coconut, Garlic and Black Pepper   - Symptoms were not entirely consistent with a food allergy, therefore we will defer  confirmatory blood testing. - I am unsure of the etiology of the oral lesions, but reassuringly there are no autoimmune signs such as fevers, additional mucocutaneous lesions, or arthritis. - In any case, we will send blood work for sunflower seed and tumeric.  - Continue to keep a diary of any foods you eat and the reactions that you have.   4. Return in about 3 months (around 12/07/2016).  Subjective:   Yvette Bell is a 61 y.o. female presenting today for evaluation of  Chief Complaint  Patient presents with  . Allergic Reaction    Yvette Bell has a history of the following: Patient Active Problem List   Diagnosis Date Noted  . Seasonal allergic rhinitis due to pollen 09/06/2016  . Mild intermittent asthma, uncomplicated 91/47/8295  . Food allergy 07/31/2016  . Blackhead 07/31/2016  . Mass on back 10/25/2015  . UTI (urinary tract infection) 03/04/2015  . Skin sensitivity 01/22/2015  . Rectal bleeding 06/17/2014  . 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 Goiter 02/17/2014  . Heart murmur, systolic 62/13/0865  . NECK SPASM 02/02/2010  . ASCUS PAP 11/25/2009  . HYPOKALEMIA 03/30/2009  . OBESITY 01/19/2008  . HYPOTHYROIDISM, HX OF 07/05/2006  . UTERINE FIBROID 03/21/2006  . HYPERTENSION, BENIGN SYSTEMIC 03/21/2006    History obtained from: chart review and patient.  Yvette Bell was referred by Nicolette Bang, DO.     Yvette Bell is a 61 y.o. female presenting for evaluation of an allergic reaction. Symptoms started around two months ago. She is having tongue swelling with redness. There is some accompanied stinging. She is having swelling above the lip. She denies wheezing, throat swelling, and stomach pain.  She has got rid of garlic which did seem to help somewhat. She stopped using apple cider vinegar. She is not eating any meats aside from chicken. She stopped eating seafood completely (she was only eating fish). She has been eating out more  lately and has continued to have problems. Symptoms have been continuous. He was eating peanut butter. She  Continues to eat eggs and wheat. She is eating soy. She has not tried taking anything in particular for it.    She did a prescription for magic mouthwash at a recent ED visit, but she has not tried using it. She denies fevers. She has no lesions anywhere else over her body. She did start brushing her teeth with baking soda. Prior to two months ago, she was fine without any problems at all.   Asthma/Respiratory Symptom History: She will have some intermittent wheezing and coughing, but this only seems to be with a viral URI or exposure to allergic triggers including pollen. She does have an albuterol inhaler, which she uses only with exposures. She has never needed any prednisone and has never needed to go to the ED for her breathing.   Allergic Rhinitis Symptom History:  She does have a history of itchy watery eyes, rhinorrhea during the summer months. She has never been allergy tested at all. She does not take any medications for her symptoms. She does have occasional throat clearing. No animals seem to make the symptoms worse.   Otherwise, there is no history of other atopic diseases, including stinging insect allergies or urticaria. There is no significant infectious history. Vaccinations are up to date.    Past Medical History: Patient Active Problem List   Diagnosis Date Noted  . Seasonal allergic rhinitis due to pollen 09/06/2016  . Mild intermittent asthma, uncomplicated 12/75/1700  . Food allergy 07/31/2016  . Blackhead 07/31/2016  . Mass on back 10/25/2015  . UTI (urinary tract infection) 03/04/2015  . Skin sensitivity 01/22/2015  . Rectal bleeding 06/17/2014  . 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 Goiter 02/17/2014  . Heart murmur, systolic 17/49/4496  . NECK SPASM 02/02/2010  . ASCUS PAP 11/25/2009  . HYPOKALEMIA 03/30/2009  . OBESITY 01/19/2008  .  HYPOTHYROIDISM, HX OF 07/05/2006  . UTERINE FIBROID 03/21/2006  . HYPERTENSION, BENIGN SYSTEMIC 03/21/2006    Medication List:  Allergies as of 09/06/2016      Reactions   Morphine    REACTION: breathing issues   Pollen Extract Other (See Comments)   Seasonal allergy symptoms      Medication List       Accurate as of 09/06/16  8:30 PM. Always use your most recent med list.          fluticasone 50 MCG/ACT nasal spray Commonly known as:  FLONASE Place 2 sprays into both nostrils daily.   hydrochlorothiazide 25 MG tablet Commonly known as:  HYDRODIURIL TAKE ONE TABLET BY MOUTH DAILY AT BEDTIME.   magic mouthwash w/lidocaine Soln 5 ml swish and spit.   naproxen 500 MG tablet Commonly known as:  NAPROSYN Take 1 tablet (500 mg total) by mouth 2 (two) times daily with a meal.       Birth History: non-contributory.  Developmental History: non-contributory.   Past Surgical History: Past Surgical History:  Procedure Laterality Date  . cesearean section    . HERNIA REPAIR       Family History: Family History  Problem Relation Age of Onset  . Stomach cancer Father   .  Colon polyps Sister   . Diabetes Sister   . Colon cancer Neg Hx   . Esophageal cancer Neg Hx   . Gallbladder disease Neg Hx   . Rectal cancer Neg Hx      Social History: Aleeha lives at home by herself. There is a neighbor with a dog. She currently has disability for chronic back pain. Shew lives in an apartment, where she has lived for approximately 18 months. There is some condensation in the home and there was a leak that resulted in some wet carpeting.     Review of Systems: a 14-point review of systems is pertinent for what is mentioned in HPI.  Otherwise, all other systems were negative. Constitutional: negative other than that listed in the HPI Eyes: negative other than that listed in the HPI Ears, nose, mouth, throat, and face: negative other than that listed in the HPI Respiratory:  negative other than that listed in the HPI Cardiovascular: negative other than that listed in the HPI Gastrointestinal: negative other than that listed in the HPI Genitourinary: negative other than that listed in the HPI Integument: negative other than that listed in the HPI Hematologic: negative other than that listed in the HPI Musculoskeletal: negative other than that listed in the HPI Neurological: negative other than that listed in the HPI Allergy/Immunologic: negative other than that listed in the HPI    Objective:   Blood pressure 128/78, pulse 85, temperature 99.5 F (37.5 C), resp. rate 19, height 5\' 5"  (1.651 m), weight 191 lb (86.6 kg), last menstrual period 08/20/2011, SpO2 96 %. Body mass index is 31.78 kg/m.   Physical Exam:  General: Alert, interactive, in no acute distress. Wearing sunglasses.  Eyes: No conjunctival injection present on the right, No conjunctival injection present on the left, PERRL bilaterally, No discharge on the right, No discharge on the left and No Horner-Trantas dots present Ears: Right TM pearly gray with normal light reflex, Left TM pearly gray with normal light reflex, Right TM intact without perforation and Left TM intact without perforation.  Nose/Throat: External nose within normal limits and septum midline, turbinates edematous and pale with clear discharge, post-pharynx erythematous with cobblestoning in the posterior oropharynx. Tonsils 2+ without exudates Neck: Supple without thyromegaly.  Adenopathy: [place]dy bilateral anterior cervical lymphadenopathy. and No enlarged lymph nodes appreciated in the occipital, axillary, epitrochlear, inguinal, or popliteal regions. Lungs: Clear to auscultation without wheezing, rhonchi or rales. No increased work of breathing. CV: Normal S1/S2, no murmurs. Capillary refill <2 seconds.  Abdomen: Nondistended, nontender. No guarding or rebound tenderness. Bowel sounds present in all fields and hypoactive    Skin: Warm and dry, without lesions or rashes.  Extremities:  No clubbing, cyanosis or edema. Neuro:   Grossly intact. No focal deficits appreciated. Responsive to questions.  Diagnostic studies:   Spirometry: results normal (FEV1: 2.22/103%, FVC: 2.51/94%, FEV1/FVC: 88%).    Spirometry consistent with normal pattern. Albuterol nebulizer treatment given in clinic with no improvement.  Allergy Studies:   Indoor/Outdoor Percutaneous Adult Environmental Panel: positive to bahia grass, [place] grass, johnson grass, [place] blue grass, meadow fescue grass, perennial rye grass, sweet vernal grass and timothy grass. Otherwise negative with adequate controls.  Indoor/Outdoor Selected Intradermal Environmental Panel: positive to ragweed mix, weed mix, mold mix #2, mold mix #3, mold mix #4, dog, cockroach and mite mix. Otherwise negative with adequate controls.  Selected Food Panel: negative to Peanut, Soy, Wheat, Corn, Milk, Egg, Casein, Shellfish Mix, Fish Mix, Cashew, [place], [place], Chicken,  Banana, Peach, [place], [place], [place], [place], [place], [place] nut, [place], Garlic and Black Pepper     Salvatore Marvel, MD [facility]

## 2016-09-11 ENCOUNTER — Encounter: Payer: Self-pay | Admitting: Allergy & Immunology

## 2016-09-11 LAB — ALLERGEN SUNFLOWER SEED K84

## 2016-09-11 LAB — ALLERGEN, TURMERIC, IGE
Class Interpretation: 0
Turmeric IgE*: <0.35 kU/L (ref <0.35)

## 2016-09-13 ENCOUNTER — Inpatient Hospital Stay (HOSPITAL_COMMUNITY)
Admission: EM | Admit: 2016-09-13 | Discharge: 2016-09-16 | DRG: 419 | Disposition: A | Discharge status: Home / Self Care | Payer: Medicare Other | Attending: General Surgery | Admitting: General Surgery

## 2016-09-13 ENCOUNTER — Telehealth: Payer: Self-pay | Admitting: Allergy & Immunology

## 2016-09-13 ENCOUNTER — Emergency Department (HOSPITAL_COMMUNITY): Payer: Medicare Other

## 2016-09-13 ENCOUNTER — Encounter (HOSPITAL_COMMUNITY): Payer: Self-pay | Admitting: Emergency Medicine

## 2016-09-13 ENCOUNTER — Other Ambulatory Visit: Payer: Self-pay | Admitting: *Deleted

## 2016-09-13 DIAGNOSIS — Z791 Long term (current) use of non-steroidal anti-inflammatories (NSAID): Secondary | ICD-10-CM

## 2016-09-13 DIAGNOSIS — K810 Acute cholecystitis: Secondary | ICD-10-CM | POA: Diagnosis present

## 2016-09-13 DIAGNOSIS — F329 Major depressive disorder, single episode, unspecified: Secondary | ICD-10-CM | POA: Diagnosis present

## 2016-09-13 DIAGNOSIS — E039 Hypothyroidism, unspecified: Secondary | ICD-10-CM | POA: Diagnosis present

## 2016-09-13 DIAGNOSIS — Z885 Allergy status to narcotic agent status: Secondary | ICD-10-CM

## 2016-09-13 DIAGNOSIS — E669 Obesity, unspecified: Secondary | ICD-10-CM | POA: Diagnosis present

## 2016-09-13 DIAGNOSIS — Z833 Family history of diabetes mellitus: Secondary | ICD-10-CM

## 2016-09-13 DIAGNOSIS — Z800 Family history of malignant neoplasm of digestive organs: Secondary | ICD-10-CM

## 2016-09-13 DIAGNOSIS — Z8371 Family history of colonic polyps: Secondary | ICD-10-CM

## 2016-09-13 DIAGNOSIS — J302 Other seasonal allergic rhinitis: Secondary | ICD-10-CM | POA: Diagnosis present

## 2016-09-13 DIAGNOSIS — Z6831 Body mass index (BMI) 31.0-31.9, adult: Secondary | ICD-10-CM

## 2016-09-13 DIAGNOSIS — K219 Gastro-esophageal reflux disease without esophagitis: Secondary | ICD-10-CM | POA: Diagnosis present

## 2016-09-13 DIAGNOSIS — Z91048 Other nonmedicinal substance allergy status: Secondary | ICD-10-CM

## 2016-09-13 DIAGNOSIS — E785 Hyperlipidemia, unspecified: Secondary | ICD-10-CM | POA: Diagnosis present

## 2016-09-13 DIAGNOSIS — K429 Umbilical hernia without obstruction or gangrene: Secondary | ICD-10-CM | POA: Diagnosis present

## 2016-09-13 DIAGNOSIS — F419 Anxiety disorder, unspecified: Secondary | ICD-10-CM | POA: Diagnosis present

## 2016-09-13 DIAGNOSIS — K660 Peritoneal adhesions (postprocedural) (postinfection): Secondary | ICD-10-CM | POA: Diagnosis present

## 2016-09-13 DIAGNOSIS — I10 Essential (primary) hypertension: Secondary | ICD-10-CM | POA: Diagnosis present

## 2016-09-13 DIAGNOSIS — R1011 Right upper quadrant pain: Secondary | ICD-10-CM

## 2016-09-13 DIAGNOSIS — M1990 Unspecified osteoarthritis, unspecified site: Secondary | ICD-10-CM | POA: Diagnosis present

## 2016-09-13 DIAGNOSIS — Z419 Encounter for procedure for purposes other than remedying health state, unspecified: Secondary | ICD-10-CM

## 2016-09-13 DIAGNOSIS — Z79899 Other long term (current) drug therapy: Secondary | ICD-10-CM

## 2016-09-13 LAB — URINALYSIS, ROUTINE W REFLEX MICROSCOPIC
Bilirubin Urine: NEGATIVE
GLUCOSE, UA: NEGATIVE mg/dL
KETONES UR: NEGATIVE mg/dL
Leukocytes, UA: NEGATIVE
Nitrite: NEGATIVE
PROTEIN: NEGATIVE mg/dL
Specific Gravity, Urine: 1.018 (ref 1.005–1.030)
pH: 7 (ref 5.0–8.0)

## 2016-09-13 LAB — COMPREHENSIVE METABOLIC PANEL
ALT: 51 U/L (ref 14–54)
AST: 89 U/L — AB (ref 15–41)
Albumin: 4.2 g/dL (ref 3.5–5.0)
Alkaline Phosphatase: 101 U/L (ref 38–126)
Anion gap: 8 (ref 5–15)
BUN: 13 mg/dL (ref 6–20)
CHLORIDE: 103 mmol/L (ref 101–111)
CO2: 29 mmol/L (ref 22–32)
Calcium: 9.6 mg/dL (ref 8.9–10.3)
Creatinine, Ser: 0.68 mg/dL (ref 0.44–1.00)
Glucose, Bld: 115 mg/dL — ABNORMAL HIGH (ref 65–99)
POTASSIUM: 3.1 mmol/L — AB (ref 3.5–5.1)
SODIUM: 140 mmol/L (ref 135–145)
Total Bilirubin: 1.1 mg/dL (ref 0.3–1.2)
Total Protein: 8.4 g/dL — ABNORMAL HIGH (ref 6.5–8.1)

## 2016-09-13 LAB — CBC
HEMATOCRIT: 39.1 % (ref 36.0–46.0)
HEMOGLOBIN: 13.3 g/dL (ref 12.0–15.0)
MCH: 28.7 pg (ref 26.0–34.0)
MCHC: 34 g/dL (ref 30.0–36.0)
MCV: 84.3 fL (ref 78.0–100.0)
Platelets: 275 10*3/uL (ref 150–400)
RBC: 4.64 MIL/uL (ref 3.87–5.11)
RDW: 14.8 % (ref 11.5–15.5)
WBC: 17.9 10*3/uL — ABNORMAL HIGH (ref 4.0–10.5)

## 2016-09-13 LAB — LIPASE, BLOOD: Lipase: 40 U/L (ref 11–51)

## 2016-09-13 MED ORDER — FENTANYL CITRATE (PF) 100 MCG/2ML IJ SOLN
12.5000 ug | Freq: Once | INTRAMUSCULAR | Status: AC
  Administered 2016-09-13: 12.5 ug via INTRAVENOUS
  Filled 2016-09-13: qty 2

## 2016-09-13 MED ORDER — ONDANSETRON HCL 4 MG/2ML IJ SOLN
4.0000 mg | Freq: Once | INTRAMUSCULAR | Status: AC
  Administered 2016-09-13: 4 mg via INTRAVENOUS
  Filled 2016-09-13: qty 2

## 2016-09-13 MED ORDER — MAGIC MOUTHWASH W/LIDOCAINE: ORAL | 0 refills | Status: DC

## 2016-09-13 NOTE — ED Provider Notes (Signed)
Medical screening examination/treatment/procedure(s) were conducted as a shared visit with non-physician practitioner(s) and myself.  I personally evaluated the patient during the encounter. Briefly, the patient is a 61 y.o. female here with RUQ and right flank pain. Work up concerning for acute cholecystitis. Discussed with surgery. Will admit for further management.    EKG Interpretation None           Cardama, Grayce Sessions, MD 09/14/16 0030

## 2016-09-13 NOTE — Telephone Encounter (Signed)
Called and spoke with patient about her lab results.

## 2016-09-13 NOTE — Telephone Encounter (Signed)
Patient is returning a call from yesterday. I looked it up and it looks like it was Caryl Pina that called her.

## 2016-09-13 NOTE — ED Triage Notes (Addendum)
Pt states that she started having back spasms about 1 hour ago. States she has had the same before and took ibuprofen that helped. Denies urinary symptoms or vaginal d/c. Alert and oriented.

## 2016-09-13 NOTE — ED Provider Notes (Signed)
[facility] DEPT Provider Note   CSN: [id number] Arrival date & time: 09/13/16  1609     History   Chief Complaint Chief Complaint  Patient presents with  . Back Pain  . Abdominal Pain    HPI Yvette Bell is a 61 y.o. female who presents to the emergency department with a chief complaint of back pain. She reports the pain began suddenly about an hour prior to arrival just after eating. She states the pain originally began in her abdomen with associated nausea, but has since moved to her mid-low back. She describes the pain as severe and "worse than childbirth." She denies urinary frequency, hesitancy, hematuria, emesis, or diarrhea. No treatment prior to arrival.  The history is provided by the patient. No language interpreter was used.    Past Medical History:  Diagnosis Date  . Allergy   . Anxiety   . Arthritis   . Asthma   . Cataract    bil eyes  . Depression   . Fibroid tumor   . GERD (gastroesophageal reflux disease)   . Hyperlipidemia   . Hypertension   . Thyroid disease    hyperactive    Patient Active Problem List   Diagnosis Date Noted  . Seasonal allergic rhinitis due to pollen 09/06/2016  . Mild intermittent asthma, uncomplicated 69/62/9528  . Food allergy 07/31/2016  . [place] 07/31/2016  . Mass on back 10/25/2015  . UTI (urinary tract infection) 03/04/2015  . Skin sensitivity 01/22/2015  . Rectal bleeding 06/17/2014  . 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 Goiter 02/17/2014  . Heart murmur, systolic 41/32/4401  . NECK SPASM 02/02/2010  . ASCUS PAP 11/25/2009  . HYPOKALEMIA 03/30/2009  . OBESITY 01/19/2008  . HYPOTHYROIDISM, HX OF 07/05/2006  . UTERINE FIBROID 03/21/2006  . HYPERTENSION, BENIGN SYSTEMIC 03/21/2006    Past Surgical History:  Procedure Laterality Date  . cesearean section    . HERNIA REPAIR      OB History    No data available       Home Medications    Prior to Admission medications     Medication Sig Start Date End Date Taking? Authorizing Provider  EPINEPHrine 0.3 mg/0.3 mL IJ SOAJ injection INJECT 0.3ML IM ONCE 07/31/16  Yes [provider]  hydrochlorothiazide (HYDRODIURIL) 25 MG tablet TAKE ONE TABLET BY MOUTH DAILY AT BEDTIME. Patient taking differently: TAKE 25 MG BY MOUTH DAILY AT BEDTIME. 03/30/16  Yes Nicolette Bang, DO  naproxen (NAPROSYN) 500 MG tablet Take 1 tablet (500 mg total) by mouth 2 (two) times daily with a meal. Patient taking differently: Take 500 mg by mouth 2 (two) times daily as needed for mild pain or moderate pain.  10/18/15  Yes Mercy Riding, MD  fluticasone (FLONASE) 50 MCG/ACT nasal spray Place 2 sprays into both nostrils daily. 09/06/16   Valentina Shaggy, MD  magic mouthwash w/lidocaine SOLN 5 ml swish and spit. Patient not taking: Reported on 09/13/2016 09/13/16   Nicolette Bang, DO    Family History Family History  Problem Relation Age of Onset  . Stomach cancer Father   . Colon polyps Sister   . Diabetes Sister   . Colon cancer Neg Hx   . Esophageal cancer Neg Hx   . Gallbladder disease Neg Hx   . Rectal cancer Neg Hx     Social History Social History  Substance Use Topics  . Smoking status: Never Smoker  . Smokeless tobacco: Never Used  .  Alcohol use 0.0 oz/week     Comment: Occassionally     Allergies   Morphine and Pollen extract   Review of Systems Review of Systems  Constitutional: Negative for activity change, chills and fever.  Respiratory: Negative for shortness of breath.   Cardiovascular: Negative for chest pain.  Gastrointestinal: Positive for abdominal pain and nausea. Negative for abdominal distention, diarrhea and vomiting.  Genitourinary: Negative for dysuria, flank pain, hematuria, urgency, vaginal bleeding, vaginal discharge and vaginal pain.  Musculoskeletal: Positive for back pain.  Skin: Negative for rash.     Physical Exam Updated Vital Signs BP (!) 136/91 (BP  Location: Left Arm)   Pulse 80   Temp 98.8 F (37.1 C) (Oral)   Resp 18   LMP 08/20/2011   SpO2 97%   Physical Exam  Constitutional: No distress.  HENT:  Head: Normocephalic.  Eyes: Conjunctivae are normal.  Neck: Neck supple.  Cardiovascular: Normal rate and regular rhythm.  Exam reveals no gallop and no friction rub.   No murmur heard. Pulmonary/Chest: Effort normal. No respiratory distress.  Abdominal: Soft. Bowel sounds are normal. She exhibits no distension and no mass. There is tenderness. There is no rebound and no guarding.  Tender to palpation in the right upper quadrant. The remainder of the abdominal exam is unremarkable. Positive Murphy sign. No CVA tenderness bilaterally.  Musculoskeletal: She exhibits no edema or deformity.  Tender to palpation over right paraspinal muscles, just lateral to the lumbar spine. No tenderness to palpation over the cervical processes of the cervical, thoracic, or lumbar spine.  Neurological: She is alert.  Skin: Skin is warm. No rash noted.  Psychiatric: Her behavior is normal.  Nursing note and vitals reviewed.    ED Treatments / Results  Labs (all labs ordered are listed, but only abnormal results are displayed) Labs Reviewed  URINALYSIS, ROUTINE W REFLEX MICROSCOPIC - Abnormal; Notable for the following:       Result Value   Hgb urine dipstick MODERATE (*)    Bacteria, UA RARE (*)    Squamous Epithelial / LPF 0-5 (*)    All other components within normal limits  COMPREHENSIVE METABOLIC PANEL - Abnormal; Notable for the following:    Potassium 3.1 (*)    Glucose, Bld 115 (*)    Total Protein 8.4 (*)    AST 89 (*)    All other components within normal limits  CBC - Abnormal; Notable for the following:    WBC 17.9 (*)    All other components within normal limits  LIPASE, BLOOD    EKG  EKG Interpretation None       Radiology Ct Renal Stone Study  Result Date: 09/13/2016 CLINICAL DATA:  61 y/o F; hematuria and  bilateral flank pain for 1 month, getting worse. EXAM: CT ABDOMEN AND PELVIS WITHOUT CONTRAST TECHNIQUE: Multidetector CT imaging of the abdomen and pelvis was performed following the standard protocol without IV contrast. COMPARISON:  09/13/2016 abdominal ultrasound FINDINGS: Lower chest: No acute abnormality. Hepatobiliary: No focal liver abnormality. Gallbladder wall thickening and pericholecystic edema with gallstones compatible with acute cholecystitis. No dilatation of common bile duct or intrahepatic bile ducts. Pancreas: Unremarkable. No pancreatic ductal dilatation or surrounding inflammatory changes. Spleen: Normal in size without focal abnormality. Adrenals/Urinary Tract: Adrenal glands are unremarkable. Kidneys are normal, without renal calculi, focal lesion, or hydronephrosis. Bladder is unremarkable. Stomach/Bowel: Stomach is within normal limits. Appendix appears normal. No evidence of bowel wall thickening, distention, or inflammatory changes. Vascular/Lymphatic: No significant  vascular findings are present. No enlarged abdominal or pelvic lymph nodes. Reproductive: Bilateral calcified uterine myomas measuring 28 mm in conglomerate. No adnexal lesion identified. Other: No abdominal wall hernia or abnormality. No abdominopelvic ascites. Musculoskeletal: Lumbar degenerative changes with prominent lower lumbar facet arthrosis. IMPRESSION: 1. Gallbladder wall thickening and pericholecystic edema with cholelithiasis is consistent with acute cholecystitis. 2. No urinary stone disease or hydronephrosis identified. 3. Calcified uterine myomata. 4. Mild lumbar degenerative changes with prominent facet arthrosis. Electronically Signed   By: Kristine Garbe M.D.   On: 09/13/2016 21:17   US Abdomen Limited Ruq  Result Date: 09/13/2016 CLINICAL DATA:  Right upper quadrant pain. EXAM: ULTRASOUND ABDOMEN LIMITED RIGHT UPPER QUADRANT COMPARISON:  None. FINDINGS: Gallbladder: Cholelithiasis is identified  with wall thickening measuring up to 5.7 mm. No Murphy's sign or pericholecystic fluid. Common bile duct: Diameter: 5.8 mm Liver: No focal lesion identified. Within normal limits in parenchymal echogenicity. Portal vein is patent on color Doppler imaging with normal direction of blood flow towards the liver. IMPRESSION: 1. Cholelithiasis and gallbladder wall thickening with no pericholecystic fluid or Murphy's sign. If there is continued concern for acute cholecystitis, a HIDA scan could help with further evaluation. Electronically Signed   By: Dorise Bullion III M.D   On: 09/13/2016 20:21    Procedures Procedures (including critical care time)  Medications Ordered in ED Medications  fentaNYL (SUBLIMAZE) injection 12.5 mcg (12.5 mcg Intravenous Given 09/13/16 1923)  ondansetron (ZOFRAN) injection 4 mg (4 mg Intravenous Given 09/13/16 1923)     Initial Impression / Assessment and Plan / ED Course  I have reviewed the triage vital signs and the nursing notes.  Pertinent labs & imaging results that were available during my care of the patient were reviewed by me and considered in my medical decision making (see chart for details).     61 year old female presenting with right upper quadrant pain, nausea, and back pain. WBC 17.9. AST 89. T. Bili, ALT, and Alk phos are within normal limits. RUQ with cholelithiasis and gallbladder wall thickening of 5.7 mm. CBD 5.7. No pericholecystic fluid noted on [place].  The patient reports she is post-menopausal. No recent hematuria or vaginal bleeding. UA with hgb, which is consistent with her last UA in 6/18. Given the hgb and back pain, Ct stone study ordered to assess for nephrolithiasis. No nephrolithiasis, but a calcified uterine myomata is noted. Discussed with the patient that further outpatient workup for hemoglobin in her urine may be necessary. Of note, pericholecystic edema demonstrated on CT.   Consulted Dr. Zella Richer with general surgery who will  admit the patient for acute cholecystitis. The patient appears reasonably stabilized for admission considering the current resources, flow, and capabilities available in the ED at this time, and I doubt any other [facility] requiring further screening and/or treatment in the ED prior to admission.  Final Clinical Impressions(s) / ED Diagnoses   Final diagnoses:  RUQ abdominal pain    New Prescriptions New Prescriptions   No medications on file     Joanne Gavel, PA-C 09/13/16 2309

## 2016-09-13 NOTE — H&P (Signed)
Yvette Bell is an 61 y.o. female.   Chief Complaint: Worsening upper abdominal pain HPI: She developed epigastric abdominal pain that radiated around to the right subscapular area today.  The pain progressively became worse and was associated with nausea.  No fever or chills.  She has not had anything like this before.  She presented to the emergency department for evaluation.  She is had findings consistent with acute cholecystitis and we were asked to see her.  Past Medical History:  Diagnosis Date  . Allergy   . Anxiety   . Arthritis   . Asthma   . Cataract    bil eyes  . Depression   . Fibroid tumor   . GERD (gastroesophageal reflux disease)   . Hyperlipidemia   . Hypertension   . Thyroid disease    hyperactive    Past Surgical History:  Procedure Laterality Date  . cesearean section    . HERNIA REPAIR      Family History  Problem Relation Age of Onset  . Stomach cancer Father   . Colon polyps Sister   . Diabetes Sister   . Colon cancer Neg Hx   . Esophageal cancer Neg Hx   . Gallbladder disease Neg Hx   . Rectal cancer Neg Hx    Social History:  reports that she has never smoked. She has never used smokeless tobacco. She reports that she drinks alcohol. She reports that she does not use drugs.  Allergies:  Allergies  Allergen Reactions  . Morphine     REACTION: breathing issues  . Pollen Extract Other (See Comments)    Seasonal allergy symptoms   Prior to Admission medications   Medication Sig Start Date End Date Taking? Authorizing Provider  EPINEPHrine 0.3 mg/0.3 mL IJ SOAJ injection INJECT 0.3ML IM ONCE 07/31/16  Yes [provider]  hydrochlorothiazide (HYDRODIURIL) 25 MG tablet TAKE ONE TABLET BY MOUTH DAILY AT BEDTIME. Patient taking differently: TAKE 25 MG BY MOUTH DAILY AT BEDTIME. 03/30/16  Yes Nicolette Bang, DO  naproxen (NAPROSYN) 500 MG tablet Take 1 tablet (500 mg total) by mouth 2 (two) times daily with a meal. Patient taking  differently: Take 500 mg by mouth 2 (two) times daily as needed for mild pain or moderate pain.  10/18/15  Yes Mercy Riding, MD  fluticasone (FLONASE) 50 MCG/ACT nasal spray Place 2 sprays into both nostrils daily. 09/06/16   Valentina Shaggy, MD  magic mouthwash w/lidocaine SOLN 5 ml swish and spit. Patient not taking: Reported on 09/13/2016 09/13/16   Nicolette Bang, DO     (Not in a hospital admission)  Results for orders placed or performed during the hospital encounter of 09/13/16 (from the past 48 hour(s))  Urinalysis, Routine w reflex microscopic     Status: Abnormal   Collection Time: 09/13/16  6:34 PM  Result Value Ref Range   Color, Urine YELLOW YELLOW   APPearance CLEAR CLEAR   Specific Gravity, Urine 1.018 1.005 - 1.030   pH 7.0 5.0 - 8.0   Glucose, UA NEGATIVE NEGATIVE mg/dL   Hgb urine dipstick MODERATE (A) NEGATIVE   Bilirubin Urine NEGATIVE NEGATIVE   Ketones, ur NEGATIVE NEGATIVE mg/dL   Protein, ur NEGATIVE NEGATIVE mg/dL   Nitrite NEGATIVE NEGATIVE   Leukocytes, UA NEGATIVE NEGATIVE   RBC / HPF 6-30 0 - 5 RBC/hpf   WBC, UA 0-5 0 - 5 WBC/hpf   Bacteria, UA RARE (A) NONE SEEN   Squamous Epithelial /  LPF 0-5 (A) NONE SEEN   Mucus PRESENT   Comprehensive metabolic panel     Status: Abnormal   Collection Time: 09/13/16  6:43 PM  Result Value Ref Range   Sodium 140 135 - 145 mmol/L   Potassium 3.1 (L) 3.5 - 5.1 mmol/L   Chloride 103 101 - 111 mmol/L   CO2 29 22 - 32 mmol/L   Glucose, Bld 115 (H) 65 - 99 mg/dL   BUN 13 6 - 20 mg/dL   Creatinine, Ser 0.68 0.44 - 1.00 mg/dL   Calcium 9.6 8.9 - 10.3 mg/dL   Total Protein 8.4 (H) 6.5 - 8.1 g/dL   Albumin 4.2 3.5 - 5.0 g/dL   AST 89 (H) 15 - 41 U/L   ALT 51 14 - 54 U/L   Alkaline Phosphatase 101 38 - 126 U/L   Total Bilirubin 1.1 0.3 - 1.2 mg/dL   GFR calc non Af Amer >60 >60 mL/min   GFR calc Af Amer >60 >60 mL/min    Comment: (NOTE) The eGFR has been calculated using the CKD EPI equation. This  calculation has not been validated in all clinical situations. eGFR's persistently <60 mL/min signify possible Chronic Kidney Disease.    Anion gap 8 5 - 15  Lipase, blood     Status: None   Collection Time: 09/13/16  6:43 PM  Result Value Ref Range   Lipase 40 11 - 51 U/L  CBC     Status: Abnormal   Collection Time: 09/13/16  6:43 PM  Result Value Ref Range   WBC 17.9 (H) 4.0 - 10.5 K/uL   RBC 4.64 3.87 - 5.11 MIL/uL   Hemoglobin 13.3 12.0 - 15.0 g/dL   HCT 39.1 36.0 - 46.0 %   MCV 84.3 78.0 - 100.0 fL   MCH 28.7 26.0 - 34.0 pg   MCHC 34.0 30.0 - 36.0 g/dL   RDW 14.8 11.5 - 15.5 %   Platelets 275 150 - 400 K/uL   Ct Renal Stone Study  Result Date: 09/13/2016 CLINICAL DATA:  61 y/o F; hematuria and bilateral flank pain for 1 month, getting worse. EXAM: CT ABDOMEN AND PELVIS WITHOUT CONTRAST TECHNIQUE: Multidetector CT imaging of the abdomen and pelvis was performed following the standard protocol without IV contrast. COMPARISON:  09/13/2016 abdominal ultrasound FINDINGS: Lower chest: No acute abnormality. Hepatobiliary: No focal liver abnormality. Gallbladder wall thickening and pericholecystic edema with gallstones compatible with acute cholecystitis. No dilatation of common bile duct or intrahepatic bile ducts. Pancreas: Unremarkable. No pancreatic ductal dilatation or surrounding inflammatory changes. Spleen: Normal in size without focal abnormality. Adrenals/Urinary Tract: Adrenal glands are unremarkable. Kidneys are normal, without renal calculi, focal lesion, or hydronephrosis. Bladder is unremarkable. Stomach/Bowel: Stomach is within normal limits. Appendix appears normal. No evidence of bowel wall thickening, distention, or inflammatory changes. Vascular/Lymphatic: No significant vascular findings are present. No enlarged abdominal or pelvic lymph nodes. Reproductive: Bilateral calcified uterine myomas measuring 28 mm in conglomerate. No adnexal lesion identified. Other: No abdominal  wall hernia or abnormality. No abdominopelvic ascites. Musculoskeletal: Lumbar degenerative changes with prominent lower lumbar facet arthrosis. IMPRESSION: 1. Gallbladder wall thickening and pericholecystic edema with cholelithiasis is consistent with acute cholecystitis. 2. No urinary stone disease or hydronephrosis identified. 3. Calcified uterine myomata. 4. Mild lumbar degenerative changes with prominent facet arthrosis. Electronically Signed   By: Kristine Garbe M.D.   On: 09/13/2016 21:17   US Abdomen Limited Ruq  Result Date: 09/13/2016 CLINICAL DATA:  Right upper quadrant  pain. EXAM: ULTRASOUND ABDOMEN LIMITED RIGHT UPPER QUADRANT COMPARISON:  None. FINDINGS: Gallbladder: Cholelithiasis is identified with wall thickening measuring up to 5.7 mm. No Murphy's sign or pericholecystic fluid. Common bile duct: Diameter: 5.8 mm Liver: No focal lesion identified. Within normal limits in parenchymal echogenicity. Portal vein is patent on color Doppler imaging with normal direction of blood flow towards the liver. IMPRESSION: 1. Cholelithiasis and gallbladder wall thickening with no pericholecystic fluid or Murphy's sign. If there is continued concern for acute cholecystitis, a HIDA scan could help with further evaluation. Electronically Signed   By: Dorise Bullion III M.D   On: 09/13/2016 20:21    Review of Systems  Constitutional: Negative for chills, fever and weight loss.  HENT: Positive for congestion.   Respiratory: Negative for shortness of breath.   Cardiovascular: Negative for chest pain.  Gastrointestinal: Positive for abdominal pain and nausea. Negative for diarrhea and vomiting.  Neurological: Negative for focal weakness.  Endo/Heme/Allergies: Does not bruise/bleed easily.       No bleeding disorders or blood clots.    Blood pressure (!) 136/91, pulse 80, temperature 98.8 F (37.1 C), temperature source Oral, resp. rate 18, last menstrual period 08/20/2011, SpO2 97  %. Physical Exam  GENERAL APPEARANCE: Overweight female in NAD.  Pleasant and cooperative.  EARS, NOSE, MOUTH THROAT:  [place]/AT external ears:  no lesions or deformities external nose:  no lesions or deformities hearing:  grossly normal lips:  moist, no deformities EYES external: conjunctiva, lids, sclerae normal pupils:  equal, round glasses: no  NECK:  Supple, no obvious mass or thyroid mass/enlargement, no trachea deviation  CV ascultation:  RRR, no murmur extremity edema:  no extremity varicosities:  no    RESP auscultation:  breath sounds equal and clear respiratory effort:  normal   GASTROINTESTINAL abdomen:  Soft, mild right upper quadrant tenderness with no guarding, non-distended, no masses liver and spleen:  not enlarged. hernia:  none present  MUSCULOSKELETAL digits/nails: no clubbing or cyanosis deformities:  none   LYMPHATIC: No palpable cervical, supraclavicular adenopathy.  NEUROLOGIC speech:  normal  PSYCHIATRIC alertness and orientation:  normal mood/affect/behavior:  normal judgement and insight:  normal   Assessment/Plan Acute cholecystitis  Plan: Admit.  IV antibiotics.  Hydrate.  Cholecystectomy this admission. I have explained the procedure, risks, and aftercare of cholecystectomy.  Risks include but are not limited to bleeding, infection, wound problems, anesthesia, diarrhea, bile leak, injury to common bile duct/liver/intestine.  She seems to understand and agrees with the treatment plan.   Odis Hollingshead, MD 09/13/2016, 10:19 PM

## 2016-09-14 ENCOUNTER — Encounter (HOSPITAL_COMMUNITY): Payer: Self-pay | Admitting: Certified Registered Nurse Anesthetist

## 2016-09-14 ENCOUNTER — Inpatient Hospital Stay (HOSPITAL_COMMUNITY): Payer: Medicare Other

## 2016-09-14 ENCOUNTER — Encounter (HOSPITAL_COMMUNITY): Admission: EM | Disposition: A | Discharge status: Home / Self Care | Payer: Self-pay | Source: Home / Self Care

## 2016-09-14 ENCOUNTER — Inpatient Hospital Stay (HOSPITAL_COMMUNITY): Payer: Medicare Other | Admitting: Anesthesiology

## 2016-09-14 DIAGNOSIS — M1990 Unspecified osteoarthritis, unspecified site: Secondary | ICD-10-CM | POA: Diagnosis present

## 2016-09-14 DIAGNOSIS — K660 Peritoneal adhesions (postprocedural) (postinfection): Secondary | ICD-10-CM | POA: Diagnosis present

## 2016-09-14 DIAGNOSIS — Z8371 Family history of colonic polyps: Secondary | ICD-10-CM | POA: Diagnosis not present

## 2016-09-14 DIAGNOSIS — K219 Gastro-esophageal reflux disease without esophagitis: Secondary | ICD-10-CM | POA: Diagnosis present

## 2016-09-14 DIAGNOSIS — K810 Acute cholecystitis: Secondary | ICD-10-CM | POA: Diagnosis present

## 2016-09-14 DIAGNOSIS — Z791 Long term (current) use of non-steroidal anti-inflammatories (NSAID): Secondary | ICD-10-CM | POA: Diagnosis not present

## 2016-09-14 DIAGNOSIS — Z91048 Other nonmedicinal substance allergy status: Secondary | ICD-10-CM | POA: Diagnosis not present

## 2016-09-14 DIAGNOSIS — F419 Anxiety disorder, unspecified: Secondary | ICD-10-CM | POA: Diagnosis present

## 2016-09-14 DIAGNOSIS — Z800 Family history of malignant neoplasm of digestive organs: Secondary | ICD-10-CM | POA: Diagnosis not present

## 2016-09-14 DIAGNOSIS — Z79899 Other long term (current) drug therapy: Secondary | ICD-10-CM | POA: Diagnosis not present

## 2016-09-14 DIAGNOSIS — J302 Other seasonal allergic rhinitis: Secondary | ICD-10-CM | POA: Diagnosis present

## 2016-09-14 DIAGNOSIS — Z885 Allergy status to narcotic agent status: Secondary | ICD-10-CM | POA: Diagnosis not present

## 2016-09-14 DIAGNOSIS — E669 Obesity, unspecified: Secondary | ICD-10-CM | POA: Diagnosis present

## 2016-09-14 DIAGNOSIS — Z833 Family history of diabetes mellitus: Secondary | ICD-10-CM | POA: Diagnosis not present

## 2016-09-14 DIAGNOSIS — Z6831 Body mass index (BMI) 31.0-31.9, adult: Secondary | ICD-10-CM | POA: Diagnosis not present

## 2016-09-14 DIAGNOSIS — E785 Hyperlipidemia, unspecified: Secondary | ICD-10-CM | POA: Diagnosis present

## 2016-09-14 DIAGNOSIS — I10 Essential (primary) hypertension: Secondary | ICD-10-CM | POA: Diagnosis present

## 2016-09-14 DIAGNOSIS — K429 Umbilical hernia without obstruction or gangrene: Secondary | ICD-10-CM | POA: Diagnosis present

## 2016-09-14 DIAGNOSIS — F329 Major depressive disorder, single episode, unspecified: Secondary | ICD-10-CM | POA: Diagnosis present

## 2016-09-14 DIAGNOSIS — E039 Hypothyroidism, unspecified: Secondary | ICD-10-CM | POA: Diagnosis present

## 2016-09-14 HISTORY — PX: CHOLECYSTECTOMY: SHX55

## 2016-09-14 HISTORY — DX: Acute cholecystitis: K81.0

## 2016-09-14 LAB — BASIC METABOLIC PANEL
Anion gap: 4 — ABNORMAL LOW (ref 5–15)
BUN: 11 mg/dL (ref 6–20)
CHLORIDE: 106 mmol/L (ref 101–111)
CO2: 28 mmol/L (ref 22–32)
Calcium: 9.1 mg/dL (ref 8.9–10.3)
Creatinine, Ser: 0.64 mg/dL (ref 0.44–1.00)
GFR calc non Af Amer: >60 mL/min (ref >60)
Glucose, Bld: 115 mg/dL — ABNORMAL HIGH (ref 65–99)
POTASSIUM: 3.6 mmol/L (ref 3.5–5.1)
Sodium: 138 mmol/L (ref 135–145)

## 2016-09-14 LAB — SURGICAL PCR SCREEN
MRSA, PCR: NEGATIVE
STAPHYLOCOCCUS AUREUS: NEGATIVE

## 2016-09-14 LAB — HIV ANTIBODY (ROUTINE TESTING W REFLEX): HIV Screen 4th Generation wRfx: NONREACTIVE

## 2016-09-14 SURGERY — LAPAROSCOPIC CHOLECYSTECTOMY
Anesthesia: General

## 2016-09-14 SURGERY — LAPAROSCOPIC CHOLECYSTECTOMY WITH INTRAOPERATIVE CHOLANGIOGRAM
Anesthesia: General | Site: Abdomen

## 2016-09-14 MED ORDER — HYDRALAZINE HCL 20 MG/ML IJ SOLN: 20.0000 mg | INTRAMUSCULAR | Status: DC | PRN

## 2016-09-14 MED ORDER — LACTATED RINGERS IV SOLN
INTRAVENOUS | Status: DC
  Administered 2016-09-14 (×3): via INTRAVENOUS

## 2016-09-14 MED ORDER — FENTANYL CITRATE (PF) 100 MCG/2ML IJ SOLN
12.5000 ug | INTRAMUSCULAR | Status: DC | PRN
  Administered 2016-09-14 – 2016-09-16 (×8): 12.5 ug via INTRAVENOUS
  Filled 2016-09-14 (×8): qty 2

## 2016-09-14 MED ORDER — ROCURONIUM BROMIDE 10 MG/ML (PF) SYRINGE
PREFILLED_SYRINGE | INTRAVENOUS | Status: DC | PRN
  Administered 2016-09-14: 50 mg via INTRAVENOUS

## 2016-09-14 MED ORDER — POTASSIUM CHLORIDE 2 MEQ/ML IV SOLN: INTRAVENOUS | Status: DC

## 2016-09-14 MED ORDER — ONDANSETRON 4 MG PO TBDP: 4.0000 mg | ORAL_TABLET | Freq: Four times a day (QID) | ORAL | Status: DC | PRN

## 2016-09-14 MED ORDER — DEXAMETHASONE SODIUM PHOSPHATE 10 MG/ML IJ SOLN
INTRAMUSCULAR | Status: DC | PRN
  Administered 2016-09-14: 10 mg via INTRAVENOUS

## 2016-09-14 MED ORDER — ONDANSETRON HCL 4 MG/2ML IJ SOLN
4.0000 mg | Freq: Once | INTRAMUSCULAR | Status: AC | PRN
  Administered 2016-09-14: 4 mg via INTRAVENOUS

## 2016-09-14 MED ORDER — FENTANYL CITRATE (PF) 250 MCG/5ML IJ SOLN
INTRAMUSCULAR | Status: AC
  Filled 2016-09-14: qty 5

## 2016-09-14 MED ORDER — IOPAMIDOL (ISOVUE-300) INJECTION 61%
INTRAVENOUS | Status: DC | PRN
  Administered 2016-09-14: 10 mL

## 2016-09-14 MED ORDER — FENTANYL CITRATE (PF) 100 MCG/2ML IJ SOLN
INTRAMUSCULAR | Status: DC | PRN
  Administered 2016-09-14 (×3): 100 ug via INTRAVENOUS
  Administered 2016-09-14: 50 ug via INTRAVENOUS

## 2016-09-14 MED ORDER — FENTANYL CITRATE (PF) 100 MCG/2ML IJ SOLN
INTRAMUSCULAR | Status: AC
  Administered 2016-09-14: 50 ug via INTRAVENOUS
  Filled 2016-09-14: qty 2

## 2016-09-14 MED ORDER — MEPERIDINE HCL 50 MG/ML IJ SOLN: 6.2500 mg | INTRAMUSCULAR | Status: DC | PRN

## 2016-09-14 MED ORDER — LACTATED RINGERS IR SOLN
Status: DC | PRN
  Administered 2016-09-14 (×2): 1000 mL

## 2016-09-14 MED ORDER — FENTANYL CITRATE (PF) 100 MCG/2ML IJ SOLN
INTRAMUSCULAR | Status: AC
  Filled 2016-09-14: qty 2

## 2016-09-14 MED ORDER — KCL IN DEXTROSE-NACL 20-5-0.45 MEQ/L-%-% IV SOLN
INTRAVENOUS | Status: DC
  Administered 2016-09-14: 23:00:00 via INTRAVENOUS
  Filled 2016-09-14 (×3): qty 1000

## 2016-09-14 MED ORDER — LIDOCAINE 2% (20 MG/ML) 5 ML SYRINGE
INTRAMUSCULAR | Status: DC | PRN
  Administered 2016-09-14: 60 mg via INTRAVENOUS

## 2016-09-14 MED ORDER — ONDANSETRON HCL 4 MG/2ML IJ SOLN
INTRAMUSCULAR | Status: AC
  Administered 2016-09-14: 4 mg via INTRAVENOUS
  Filled 2016-09-14: qty 2

## 2016-09-14 MED ORDER — PIPERACILLIN-TAZOBACTAM 3.375 G IVPB
3.3750 g | Freq: Three times a day (TID) | INTRAVENOUS | Status: DC
  Administered 2016-09-14 – 2016-09-16 (×7): 3.375 g via INTRAVENOUS
  Filled 2016-09-14 (×6): qty 50

## 2016-09-14 MED ORDER — ONDANSETRON HCL 4 MG/2ML IJ SOLN
4.0000 mg | INTRAMUSCULAR | Status: DC | PRN
  Administered 2016-09-14 – 2016-09-15 (×3): 4 mg via INTRAVENOUS
  Filled 2016-09-14 (×2): qty 2

## 2016-09-14 MED ORDER — POTASSIUM CHLORIDE 10 MEQ/100ML IV SOLN
10.0000 meq | INTRAVENOUS | Status: AC
  Administered 2016-09-14 (×3): 10 meq via INTRAVENOUS
  Filled 2016-09-14 (×3): qty 100

## 2016-09-14 MED ORDER — KCL IN DEXTROSE-NACL 20-5-0.9 MEQ/L-%-% IV SOLN
INTRAVENOUS | Status: DC
  Administered 2016-09-14: 02:00:00 via INTRAVENOUS
  Filled 2016-09-14 (×2): qty 1000

## 2016-09-14 MED ORDER — MIDAZOLAM HCL 2 MG/2ML IJ SOLN
INTRAMUSCULAR | Status: AC
  Filled 2016-09-14: qty 2

## 2016-09-14 MED ORDER — BUPIVACAINE-EPINEPHRINE (PF) 0.25% -1:200000 IJ SOLN
INTRAMUSCULAR | Status: AC
  Filled 2016-09-14: qty 30

## 2016-09-14 MED ORDER — BUPIVACAINE-EPINEPHRINE 0.25% -1:200000 IJ SOLN
INTRAMUSCULAR | Status: DC | PRN
  Administered 2016-09-14: 30 mL

## 2016-09-14 MED ORDER — SUGAMMADEX SODIUM 200 MG/2ML IV SOLN
INTRAVENOUS | Status: DC | PRN
  Administered 2016-09-14: 200 mg via INTRAVENOUS

## 2016-09-14 MED ORDER — PIPERACILLIN-TAZOBACTAM 3.375 G IVPB
INTRAVENOUS | Status: AC
  Filled 2016-09-14: qty 50

## 2016-09-14 MED ORDER — 0.9 % SODIUM CHLORIDE (POUR BTL) OPTIME
TOPICAL | Status: DC | PRN
  Administered 2016-09-14: 1000 mL

## 2016-09-14 MED ORDER — MIDAZOLAM HCL 5 MG/5ML IJ SOLN
INTRAMUSCULAR | Status: DC | PRN
  Administered 2016-09-14: 2 mg via INTRAVENOUS

## 2016-09-14 MED ORDER — PROPOFOL 10 MG/ML IV BOLUS
INTRAVENOUS | Status: DC | PRN
  Administered 2016-09-14: 200 mg via INTRAVENOUS

## 2016-09-14 MED ORDER — PANTOPRAZOLE SODIUM 40 MG IV SOLR
40.0000 mg | Freq: Every day | INTRAVENOUS | Status: DC
  Administered 2016-09-14 – 2016-09-15 (×3): 40 mg via INTRAVENOUS
  Filled 2016-09-14 (×3): qty 40

## 2016-09-14 MED ORDER — IOPAMIDOL (ISOVUE-300) INJECTION 61%
INTRAVENOUS | Status: AC
  Filled 2016-09-14: qty 50

## 2016-09-14 MED ORDER — FENTANYL CITRATE (PF) 100 MCG/2ML IJ SOLN
25.0000 ug | INTRAMUSCULAR | Status: DC | PRN
  Administered 2016-09-14 (×2): 50 ug via INTRAVENOUS

## 2016-09-14 SURGICAL SUPPLY — 45 items
ADH SKN CLS APL DERMABOND .7 (GAUZE/BANDAGES/DRESSINGS) ×1
APL SKNCLS STERI-STRIP NONHPOA (GAUZE/BANDAGES/DRESSINGS)
APPLIER CLIP 5 13 M/L LIGAMAX5 (MISCELLANEOUS)
APPLIER CLIP ROT 10 11.4 M/L (STAPLE) ×3
APR CLP MED LRG 11.4X10 (STAPLE) ×1
APR CLP MED LRG 5 ANG JAW (MISCELLANEOUS)
BAG SPEC RTRVL 10 TROC 200 (ENDOMECHANICALS) ×1
BAG SPEC RTRVL LRG 6X4 10 (ENDOMECHANICALS) ×1
BENZOIN TINCTURE PRP APPL 2/3 (GAUZE/BANDAGES/DRESSINGS) IMPLANT
CABLE HIGH FREQUENCY MONO STRZ (ELECTRODE) ×3 IMPLANT
CHLORAPREP W/TINT 26ML (MISCELLANEOUS) ×3 IMPLANT
CHOLANGIOGRAM CATH TAUT (CATHETERS) ×3 IMPLANT
CLIP APPLIE 5 13 M/L LIGAMAX5 (MISCELLANEOUS) IMPLANT
CLIP APPLIE ROT 10 11.4 M/L (STAPLE) IMPLANT
CLOSURE WOUND 1/4X4 (GAUZE/BANDAGES/DRESSINGS)
COVER MAYO STAND STRL (DRAPES) ×3 IMPLANT
COVER SURGICAL LIGHT HANDLE (MISCELLANEOUS) ×3 IMPLANT
DECANTER SPIKE VIAL GLASS SM (MISCELLANEOUS) ×3 IMPLANT
DERMABOND ADVANCED (GAUZE/BANDAGES/DRESSINGS) ×2
DERMABOND ADVANCED .7 DNX12 (GAUZE/BANDAGES/DRESSINGS) IMPLANT
DRAPE C-ARM 42X120 X-RAY (DRAPES) ×3 IMPLANT
ELECT REM PT RETURN 15FT ADLT (MISCELLANEOUS) ×3 IMPLANT
GLOVE SURG SIGNA 7.5 PF LTX (GLOVE) ×3 IMPLANT
GOWN STRL REUS W/TWL XL LVL3 (GOWN DISPOSABLE) ×9 IMPLANT
HEMOSTAT SURGICEL 4X8 (HEMOSTASIS) IMPLANT
IV CATH 14GX2 1/4 (CATHETERS) ×3 IMPLANT
IV SET EXTENSION CATH 6 NF (IV SETS) ×3 IMPLANT
KIT BASIN OR (CUSTOM PROCEDURE TRAY) ×3 IMPLANT
POUCH RETRIEVAL ECOSAC 10 (ENDOMECHANICALS) ×1 IMPLANT
POUCH RETRIEVAL ECOSAC 10MM (ENDOMECHANICALS) ×2
POUCH SPECIMEN RETRIEVAL 10MM (ENDOMECHANICALS) ×3 IMPLANT
SCISSORS LAP 5X35 DISP (ENDOMECHANICALS) ×3 IMPLANT
SET TUBE IRRIG SUCTION NO TIP (IRRIGATION / IRRIGATOR) ×3 IMPLANT
SLEEVE ADV FIXATION 5X100MM (TROCAR) ×3 IMPLANT
STOPCOCK 4 WAY LG BORE MALE ST (IV SETS) ×3 IMPLANT
STRIP CLOSURE SKIN 1/4X4 (GAUZE/BANDAGES/DRESSINGS) IMPLANT
SUT MNCRL AB 4-0 PS2 18 (SUTURE) ×3 IMPLANT
SYR 10ML ECCENTRIC (SYRINGE) ×3 IMPLANT
TOWEL OR 17X26 10 PK STRL BLUE (TOWEL DISPOSABLE) ×3 IMPLANT
TOWEL OR NON WOVEN STRL DISP B (DISPOSABLE) ×3 IMPLANT
TRAY LAPAROSCOPIC (CUSTOM PROCEDURE TRAY) ×3 IMPLANT
TROCAR ADV FIXATION 11X100MM (TROCAR) IMPLANT
TROCAR ADV FIXATION 5X100MM (TROCAR) ×3 IMPLANT
TROCAR XCEL BLUNT TIP 100MML (ENDOMECHANICALS) ×3 IMPLANT
TUBING INSUF HEATED (TUBING) ×3 IMPLANT

## 2016-09-14 NOTE — Anesthesia Procedure Notes (Signed)
Procedure Name: Intubation Performed by: Gean Maidens Pre-anesthesia Checklist: Patient identified, Emergency Drugs available, Suction available, Patient being monitored and Timeout performed Patient Re-evaluated:Patient Re-evaluated prior to induction Oxygen Delivery Method: Circle system utilized Preoxygenation: Pre-oxygenation with 100% oxygen Induction Type: IV induction Ventilation: Mask ventilation without difficulty Laryngoscope Size: Mac and 4 Grade View: Grade I Tube type: Oral Tube size: 7.0 mm Number of attempts: 1 Airway Equipment and Method: Stylet Placement Confirmation: ETT inserted through vocal cords under direct vision,  positive ETCO2,  CO2 detector and breath sounds checked- equal and bilateral Secured at: 22 cm Tube secured with: Tape Dental Injury: Teeth and Oropharynx as per pre-operative assessment

## 2016-09-14 NOTE — ED Notes (Signed)
Released admit orders.

## 2016-09-14 NOTE — ED Notes (Signed)
Gave report to [employer] RN

## 2016-09-14 NOTE — Progress Notes (Signed)
Nutrition Brief Note  Patient identified on the Malnutrition Screening Tool (MST) Report  Wt Readings from Last 15 Encounters:  09/14/16 190 lb 14.7 oz (86.6 kg)  09/06/16 191 lb (86.6 kg)  08/27/16 190 lb (86.2 kg)  07/31/16 191 lb 12.8 oz (87 kg)  07/12/16 190 lb (86.2 kg)  12/19/15 198 lb 9.6 oz (90.1 kg)  10/25/15 192 lb 12.8 oz (87.5 kg)  10/18/15 195 lb (88.5 kg)  09/14/15 193 lb 9.6 oz (87.8 kg)  08/24/15 191 lb 9.6 oz (86.9 kg)  03/04/15 195 lb 6.4 oz (88.6 kg)  01/21/15 192 lb (87.1 kg)  07/13/14 192 lb (87.1 kg)  06/17/14 192 lb (87.1 kg)  02/17/14 197 lb 4.8 oz (89.5 kg)    Body mass index is 31.77 kg/m. Patient meets criteria for obesity based on current BMI. Stable weight for the past 1 year. Skin WDL. Pt ate lunch yesterday and began having abdominal pain and nausea. Pain then moved to back and became severe. No vomiting PTA or since admission. Good appetite and no recent changes in intakes prior to this episode.   Current diet order is NPO; pt has been NPO since admission with plan for cholecystectomy this admission.  Medications reviewed; 4 mg IV Zofran x1 dose yesterday evening, 40 mg IV Protonix/day, 10 mEq IV KCl x3 runs today.  Labs reviewed; K: 3.1 mmol/L yesterday evening now up to 3.6 mmol/L following repletion. IVF: D5-NS-20 mEq IV KCl @ 100 mL/hr (408 kcal from dextrose).  No nutrition interventions warranted at this time. If nutrition issues arise, please consult RD.     Jarome Matin, MS, RD, LDN, [facility] Inpatient Clinical Dietitian Pager # [phone] After hours/weekend pager # [phone]

## 2016-09-14 NOTE — Anesthesia Postprocedure Evaluation (Signed)
Anesthesia Post Note  Patient: Yvette Bell  Procedure(s) Performed: Procedure(s) (LRB): LAPAROSCOPIC CHOLECYSTECTOMY WITH INTRAOPERATIVE CHOLANGIOGRAM (N/A)     Patient location during evaluation: PACU Anesthesia Type: General Level of consciousness: awake and sedated Pain management: pain level controlled Vital Signs Assessment: post-procedure vital signs reviewed and stable Respiratory status: spontaneous breathing Cardiovascular status: stable Postop Assessment: no signs of nausea or vomiting Anesthetic complications: no    Last Vitals:  Vitals:   09/14/16 1600 09/14/16 1615  BP: (!) 149/92 (!) 142/85  Pulse: 83 70  Resp: 15 12  Temp:    SpO2: 97% 96%    Last Pain:  Vitals:   09/14/16 1615  TempSrc:   PainSc: Asleep   Pain Goal: Patients Stated Pain Goal: 3 (09/14/16 0213)               [name],[name]

## 2016-09-14 NOTE — Anesthesia Preprocedure Evaluation (Signed)
Anesthesia Evaluation    Airway Mallampati: II  TM Distance: >3 FB Neck ROM: Full    Dental no notable dental hx.    Pulmonary    Pulmonary exam normal breath sounds clear to auscultation       Cardiovascular hypertension, Pt. on medications Normal cardiovascular exam Rhythm:Regular Rate:Normal     Neuro/Psych PSYCHIATRIC DISORDERS Anxiety Depression    GI/Hepatic GERD  Medicated,  Endo/Other  Hypothyroidism   Renal/GU      Musculoskeletal  (+) Arthritis , Osteoarthritis,    Abdominal   Peds  Hematology   Anesthesia Other Findings   Reproductive/Obstetrics                           Anesthesia Physical Anesthesia Plan  ASA: II  Anesthesia Plan: General   Post-op Pain Management:    Induction: Intravenous  PONV Risk Score and Plan: 2 and Ondansetron, Dexamethasone, Treatment may vary due to age or medical condition and Midazolam  Airway Management Planned: Oral ETT  Additional Equipment:   Intra-op Plan:   Post-operative Plan: Extubation in OR  Informed Consent:   Plan Discussed with:   Anesthesia Plan Comments: (  )        Anesthesia Quick Evaluation

## 2016-09-14 NOTE — Progress Notes (Signed)
Patient examined and chart reviewed. Has cholecystitis.  I discussed with the patient the indications and risks of gall bladder surgery.  The primary risks of gall bladder surgery include, but are not limited to, bleeding, infection, common bile duct injury, and open surgery.  There is also the risk that the patient may have continued symptoms after surgery.  We discussed the typical post-operative recovery course. I tried to answer the patient's questions.  She is on chronic disability for her back.  She is expecting her god daughter, Sudie Grumbling, and her daughter, Andee Poles, to be around after surgery.  She was offered to go to [facility] fur surgery - because the OR is backed up, but she wants to stay here to have the surgery.  Alphonsa Overall, MD, [facility] Surgery Pager: [phone] Office phone:  [phone]

## 2016-09-14 NOTE — Transfer of Care (Signed)
Immediate Anesthesia Transfer of Care Note  Patient: Yvette Bell  Procedure(s) Performed: Procedure(s): LAPAROSCOPIC CHOLECYSTECTOMY WITH INTRAOPERATIVE CHOLANGIOGRAM (N/A)  Patient Location: PACU  Anesthesia Type:General  Level of Consciousness: awake, alert  and oriented  Airway & Oxygen Therapy: Patient Spontanous Breathing and Patient connected to face mask oxygen  Post-op Assessment: Report given to RN and Post -op Vital signs reviewed and stable  Post vital signs: Reviewed and stable   Last Vitals:  Vitals:   09/14/16 0151 09/14/16 0608  BP: 125/77 136/83  Pulse: 64 63  Resp: 18 18  Temp: 36.8 C 37.2 C  SpO2: 98% 98%    Last Pain:  Vitals:   09/14/16 0608  TempSrc: Oral  PainSc:       Patients Stated Pain Goal: 3 (19/14/78 2956)  Complications: No apparent anesthesia complications

## 2016-09-14 NOTE — Op Note (Signed)
09/13/2016 - 09/14/2016  3:16 PM  PATIENT:  Yvette Bell, 61 y.o., female, MRN: [id number]  PREOP DIAGNOSIS:  cholelithasis  POSTOP DIAGNOSIS:   Cholecystitis, cholelithiasis  PROCEDURE:   Procedure(s): LAPAROSCOPIC CHOLECYSTECTOMY WITH INTRAOPERATIVE CHOLANGIOGRAM  SURGEON:   Alphonsa Overall, M.D.  ASSISTANT:   None  ANESTHESIA:   general  Anesthesiologist: Janeece Riggers, MD CRNA: Gean Maidens, CRNA  General  ASA: 2E  EBL:  minimal  ml  BLOOD ADMINISTERED: none  DRAINS: none   LOCAL MEDICATIONS USED:   30 cc of 1/4% marcaine  SPECIMEN:   Gall bladder2  COUNTS CORRECT:  YES  INDICATIONS FOR PROCEDURE:  Yvette Bell is a 60 y.o. (DOB: 1955-12-30) AA female whose primary care physician is Nicolette Bang, DO and comes for cholecystectomy.   The indications and risks of the gall bladder surgery were explained to the patient.  The risks include, but are not limited to, infection, bleeding, common bile duct injury and open surgery.  SURGERY:  The patient was taken to OR room #1 at [facility].  The abdomen was prepped with chloroprep.  The patient was on Zosyn at the beginning of the operation.   A time out was held and the surgical checklist run.   An infraumbilical incision was made into the abdominal cavity.  A 12 mm Hasson trocar was inserted into the abdominal cavity through the infraumbilical incision and secured with a 0 Vicryl suture.  Three additional trocars were inserted: a 10 mm trocar in the sub-xiphoid location, a 5 mm trocar in the right mid subcostal area, and a 5 mm trocar in the right lateral subcostal area.   The abdomen was explored and the liver, stomach, and bowel that could be seen were unremarkable.  She did have some adhesion to the undersurface of the umbilicus.   There appeared to be a small umbilical hernia (1.0 cm).  I took the adhesions down, but left the umbilical hernia alone.   The gall bladder was mildly edematous.  There  were some filmy adhesions over it.   I grasped the gall bladder and rotated it cephalad.  Disssection was carried down to the gall bladder/cystic duct junction and the cystic duct isolated.  A clip was placed on the gall bladder side of the cystic duct.   An intra-operative cholangiogram was shot.   The intra-operative cholangiogram was shot using a cut off Taut catheter placed through a 14 gauge angiocath in the RUQ.  The Taut catheter was inserted in the cut cystic duct and secured with an endoclip.  A cholangiogram was shot with 8 cc of 1/2 strength Isoview.  Using fluoroscopy, the cholangiogram showed the flow of contrast into the common bile duct, up the hepatic radicals, and into the duodenum.  The common bile duct was acutely angulated, mildly dilated, but had obvious abnormality on cholangiogram. There was no mass or obstruction.  This was a normal intra-operative cholangiogram.   The Taut catheter was removed.  The cystic duct was tripley endoclipped and the cystic artery was identified and clipped.  The gall bladder was bluntly and sharpley dissected from the gall bladder bed.   After the gall bladder was removed from the liver, the gall bladder bed and Triangle of [place] were inspected.  There was no bleeding or bile leak.  The gall bladder was placed in a Ecco sac bag and delivered through the umbilicus.  The abdomen was irrigated with 2,000 cc saline.   The trocars were then  removed.  I infiltrated 30cc of 1/4% Marcaine into the incisions.  The umbilical port closed with a 0 Vicryl suture and the skin closed with 4-0 Monocryl.  The skin was painted with DermaBond.  The patient's sponge and needle count were correct.  The patient was transported to the [facility] in good condition.   She should be able to go home tomorrow.  She should not need antibiotics.  Alphonsa Overall, MD, [facility] Surgery Pager: [phone] Office phone:  [phone]

## 2016-09-15 MED ORDER — MENTHOL 3 MG MT LOZG
1.0000 | LOZENGE | OROMUCOSAL | Status: DC | PRN
Start: 2016-09-15 — End: 2016-09-16
  Administered 2016-09-16: 3 mg via ORAL
  Filled 2016-09-15 (×2): qty 9

## 2016-09-15 MED ORDER — HYDROCHLOROTHIAZIDE 25 MG PO TABS
25.0000 mg | ORAL_TABLET | Freq: Every day | ORAL | Status: DC
  Administered 2016-09-15: 25 mg via ORAL
  Filled 2016-09-15: qty 1

## 2016-09-15 MED ORDER — PHENOL 1.4 % MT LIQD
1.0000 | OROMUCOSAL | Status: DC | PRN
Start: 2016-09-15 — End: 2016-09-16
  Filled 2016-09-15: qty 177

## 2016-09-15 NOTE — Progress Notes (Signed)
Patient ID: Yvette Bell, female   DOB: 28-Mar-1955, 61 y.o.   MRN: [id number] 1 Day Post-Op   Subjective: Complaining of quite a bit of pain in right upper quadrant and right flank. Has been up out of bed. No nausea or vomiting.  Objective: Vital signs in last 24 hours: Temp:  [97.6 F (36.4 C)-98.8 F (37.1 C)] 98.2 F (36.8 C) (08/25 0241) Pulse Rate:  [59-87] 78 (08/25 0526) Resp:  [12-18] 18 (08/25 0526) BP: (123-149)/(81-104) 147/104 (08/25 0526) SpO2:  [96 %-100 %] 100 % (08/25 0526) Last BM Date: 09/12/16  Intake/Output from previous day: 08/24 0701 - 08/25 0700 In: 2770 [P.O.:600; I.V.:2070; IV Piggyback:100] Out: 7124 [Urine:1700; Blood:25] Intake/Output this shift: Total I/O In: -  Out: 400 [Urine:400]  General appearance: alert, cooperative and mild distress GI: Tender midabdomen and right upper quadrant. No distention. Incision/Wound: Clean and dry  Lab Results:   Recent Labs  09/13/16 1843  WBC 17.9*  HGB 13.3  HCT 39.1  PLT 275   BMET  Recent Labs  09/13/16 1843 09/14/16 0518  NA 140 138  K 3.1* 3.6  CL 103 106  CO2 29 28  GLUCOSE 115* 115*  BUN 13 11  CREATININE 0.68 0.64  CALCIUM 9.6 9.1     Studies/Results: Dg Cholangiogram Operative  Result Date: 09/14/2016 CLINICAL DATA:  61 year old female with a history of cholelithiasis EXAM: INTRAOPERATIVE CHOLANGIOGRAM TECHNIQUE: Cholangiographic images from the C-arm fluoroscopic device were submitted for interpretation post-operatively. Please see the procedural report for the amount of contrast and the fluoroscopy time utilized. COMPARISON:  None. FINDINGS: Surgical instruments project over the upper abdomen. There is cannulation of the cystic duct/gallbladder neck, with antegrade infusion of contrast. Caliber of the extrahepatic ductal system within normal limits. No large filling defect identified. Free flow of contrast across the ampulla. IMPRESSION: Intraoperative cholangiogram demonstrates  extrahepatic biliary ducts of unremarkable caliber, with no large filling defect identified. Free flow of contrast across the ampulla. Please refer to the dictated operative report for full details of intraoperative findings and procedure Electronically Signed   By: Corrie Mckusick D.O.   On: 09/14/2016 15:35   Ct Renal Stone Study  Result Date: 09/13/2016 CLINICAL DATA:  61 y/o F; hematuria and bilateral flank pain for 1 month, getting worse. EXAM: CT ABDOMEN AND PELVIS WITHOUT CONTRAST TECHNIQUE: Multidetector CT imaging of the abdomen and pelvis was performed following the standard protocol without IV contrast. COMPARISON:  09/13/2016 abdominal ultrasound FINDINGS: Lower chest: No acute abnormality. Hepatobiliary: No focal liver abnormality. Gallbladder wall thickening and pericholecystic edema with gallstones compatible with acute cholecystitis. No dilatation of common bile duct or intrahepatic bile ducts. Pancreas: Unremarkable. No pancreatic ductal dilatation or surrounding inflammatory changes. Spleen: Normal in size without focal abnormality. Adrenals/Urinary Tract: Adrenal glands are unremarkable. Kidneys are normal, without renal calculi, focal lesion, or hydronephrosis. Bladder is unremarkable. Stomach/Bowel: Stomach is within normal limits. Appendix appears normal. No evidence of bowel wall thickening, distention, or inflammatory changes. Vascular/Lymphatic: No significant vascular findings are present. No enlarged abdominal or pelvic lymph nodes. Reproductive: Bilateral calcified uterine myomas measuring 28 mm in conglomerate. No adnexal lesion identified. Other: No abdominal wall hernia or abnormality. No abdominopelvic ascites. Musculoskeletal: Lumbar degenerative changes with prominent lower lumbar facet arthrosis. IMPRESSION: 1. Gallbladder wall thickening and pericholecystic edema with cholelithiasis is consistent with acute cholecystitis. 2. No urinary stone disease or hydronephrosis identified.  3. Calcified uterine myomata. 4. Mild lumbar degenerative changes with prominent facet arthrosis. Electronically Signed  By: Kristine Garbe M.D.   On: 09/13/2016 21:17   US Abdomen Limited Ruq  Result Date: 09/13/2016 CLINICAL DATA:  Right upper quadrant pain. EXAM: ULTRASOUND ABDOMEN LIMITED RIGHT UPPER QUADRANT COMPARISON:  None. FINDINGS: Gallbladder: Cholelithiasis is identified with wall thickening measuring up to 5.7 mm. No Murphy's sign or pericholecystic fluid. Common bile duct: Diameter: 5.8 mm Liver: No focal lesion identified. Within normal limits in parenchymal echogenicity. Portal vein is patent on color Doppler imaging with normal direction of blood flow towards the liver. IMPRESSION: 1. Cholelithiasis and gallbladder wall thickening with no pericholecystic fluid or Murphy's sign. If there is continued concern for acute cholecystitis, a HIDA scan could help with further evaluation. Electronically Signed   By: Dorise Bullion III M.D   On: 09/13/2016 20:21    Anti-infectives: Anti-infectives    Start     Dose/Rate Route Frequency Ordered Stop   09/14/16 1344  piperacillin-tazobactam (ZOSYN) 3.375 GM/50ML IVPB    Comments:  Bridget Hartshorn   : cabinet override      09/14/16 1344 09/14/16 1411   09/14/16 0200  piperacillin-tazobactam (ZOSYN) IVPB 3.375 g     3.375 g 12.5 mL/hr over 240 Minutes Intravenous Every 8 hours 09/14/16 0149        Assessment/Plan: s/p Procedure(s): LAPAROSCOPIC CHOLECYSTECTOMY WITH INTRAOPERATIVE CHOLANGIOGRAM Stable but having quite a bit of postoperative pain and I don't think able to go home this morning. Doubt complication. Observe in the hospital today. Recheck CBC and LFTs tomorrow. Hypertension-will restart regular medications   LOS: 1 day    Derric Dealmeida T 09/15/2016

## 2016-09-16 LAB — COMPREHENSIVE METABOLIC PANEL
ALT: 79 U/L — ABNORMAL HIGH (ref 14–54)
ANION GAP: 6 (ref 5–15)
AST: 43 U/L — ABNORMAL HIGH (ref 15–41)
Albumin: 3.3 g/dL — ABNORMAL LOW (ref 3.5–5.0)
Alkaline Phosphatase: 77 U/L (ref 38–126)
BUN: 12 mg/dL (ref 6–20)
CHLORIDE: 107 mmol/L (ref 101–111)
CO2: 27 mmol/L (ref 22–32)
Calcium: 8.8 mg/dL — ABNORMAL LOW (ref 8.9–10.3)
Creatinine, Ser: 0.75 mg/dL (ref 0.44–1.00)
Glucose, Bld: 108 mg/dL — ABNORMAL HIGH (ref 65–99)
POTASSIUM: 3.5 mmol/L (ref 3.5–5.1)
SODIUM: 140 mmol/L (ref 135–145)
Total Bilirubin: 0.8 mg/dL (ref 0.3–1.2)
Total Protein: 6.7 g/dL (ref 6.5–8.1)

## 2016-09-16 LAB — CBC
HCT: 34.1 % — ABNORMAL LOW (ref 36.0–46.0)
Hemoglobin: 10.9 g/dL — ABNORMAL LOW (ref 12.0–15.0)
MCH: 28 pg (ref 26.0–34.0)
MCHC: 32 g/dL (ref 30.0–36.0)
MCV: 87.7 fL (ref 78.0–100.0)
PLATELETS: 219 10*3/uL (ref 150–400)
RBC: 3.89 MIL/uL (ref 3.87–5.11)
RDW: 15.6 % — AB (ref 11.5–15.5)
WBC: 15 10*3/uL — ABNORMAL HIGH (ref 4.0–10.5)

## 2016-09-16 MED ORDER — OXYCODONE HCL 10 MG PO TABS: 5.0000 mg | ORAL_TABLET | ORAL | 0 refills | Status: DC | PRN

## 2016-09-16 MED ORDER — OXYCODONE HCL 5 MG PO TABS
10.0000 mg | ORAL_TABLET | ORAL | Status: DC | PRN
  Administered 2016-09-16: 10 mg via ORAL
  Filled 2016-09-16: qty 2

## 2016-09-16 MED ORDER — AMOXICILLIN-POT CLAVULANATE 875-125 MG PO TABS: 1.0000 | ORAL_TABLET | Freq: Two times a day (BID) | ORAL | 0 refills | Status: DC

## 2016-09-16 NOTE — Progress Notes (Signed)
Assessment unchanged. Pt verbalized understanding of dc instructions through teach back including follow up care, meds to resume and when to call the doctor. Script x 2 given as provided by the doctor. Discharged via wc to front entrance accompanied by NT and daughter.

## 2016-09-16 NOTE — Progress Notes (Signed)
Patient ID: Yvette Bell, female   DOB: 1955/08/07, 61 y.o.   MRN: [id number] 2 Days Post-Op   Subjective: Feels much better today. Some right upper quadrant pain but steadily improving. Tolerating diet.  Objective: Vital signs in last 24 hours: Temp:  [98.5 F (36.9 C)-99.1 F (37.3 C)] 98.9 F (37.2 C) (08/26 0543) Pulse Rate:  [67-82] 67 (08/26 0543) Resp:  [15-16] 15 (08/26 0543) BP: (111-141)/(76-98) 141/98 (08/26 0543) SpO2:  [98 %-100 %] 99 % (08/26 0543) Last BM Date: 09/12/16  Intake/Output from previous day: 08/25 0701 - 08/26 0700 In: 2593.3 [P.O.:1200; I.V.:1293.3; IV Piggyback:100] Out: 2725 [Urine:2725] Intake/Output this shift: No intake/output data recorded.  General appearance: alert, cooperative and no distress GI: Mild right upper quadrant tenderness much improved from yesterday Incision/Wound: No erythema or drainage  Lab Results:   Recent Labs  09/13/16 1843 09/16/16 0418  WBC 17.9* 15.0*  HGB 13.3 10.9*  HCT 39.1 34.1*  PLT 275 219   BMET  Recent Labs  09/14/16 0518 09/16/16 0418  NA 138 140  K 3.6 3.5  CL 106 107  CO2 28 27  GLUCOSE 115* 108*  BUN 11 12  CREATININE 0.64 0.75  CALCIUM 9.1 8.8*     Studies/Results: Dg Cholangiogram Operative  Result Date: 09/14/2016 CLINICAL DATA:  61 year old female with a history of cholelithiasis EXAM: INTRAOPERATIVE CHOLANGIOGRAM TECHNIQUE: Cholangiographic images from the C-arm fluoroscopic device were submitted for interpretation post-operatively. Please see the procedural report for the amount of contrast and the fluoroscopy time utilized. COMPARISON:  None. FINDINGS: Surgical instruments project over the upper abdomen. There is cannulation of the cystic duct/gallbladder neck, with antegrade infusion of contrast. Caliber of the extrahepatic ductal system within normal limits. No large filling defect identified. Free flow of contrast across the ampulla. IMPRESSION: Intraoperative cholangiogram  demonstrates extrahepatic biliary ducts of unremarkable caliber, with no large filling defect identified. Free flow of contrast across the ampulla. Please refer to the dictated operative report for full details of intraoperative findings and procedure Electronically Signed   By: Corrie Mckusick D.O.   On: 09/14/2016 15:35    Anti-infectives: Anti-infectives    Start     Dose/Rate Route Frequency Ordered Stop   09/14/16 1344  piperacillin-tazobactam (ZOSYN) 3.375 GM/50ML IVPB    Comments:  Bridget Hartshorn   : cabinet override      09/14/16 1344 09/14/16 1411   09/14/16 0200  piperacillin-tazobactam (ZOSYN) IVPB 3.375 g     3.375 g 12.5 mL/hr over 240 Minutes Intravenous Every 8 hours 09/14/16 0149        Assessment/Plan: s/p Procedure(s): LAPAROSCOPIC CHOLECYSTECTOMY WITH INTRAOPERATIVE CHOLANGIOGRAM Doing well postoperatively without apparent complication. Okay for discharge today.   LOS: 2 days    Kolt Mcwhirter T 09/16/2016

## 2016-09-16 NOTE — Discharge Instructions (Signed)
[facility] ______[facility] °LAPAROSCOPIC SURGERY: POST OP INSTRUCTIONS °Always review your discharge instruction sheet given to you by the facility where your surgery was performed. °IF YOU HAVE DISABILITY OR FAMILY LEAVE FORMS, YOU MUST BRING THEM TO THE OFFICE FOR PROCESSING.   °DO NOT GIVE THEM TO YOUR DOCTOR. ° °1. A prescription for pain medication may be given to you upon discharge.  Take your pain medication as prescribed, if needed.  If narcotic pain medicine is not needed, then you may take acetaminophen (Tylenol) or ibuprofen (Advil) as needed. °2. Take your usually prescribed medications unless otherwise directed. °3. If you need a refill on your pain medication, please contact your pharmacy.  They will contact our office to request authorization. Prescriptions will not be filled after 5pm or on week-ends. °4. You should follow a light diet the first few days after arrival home, such as soup and crackers, etc.  Be sure to include lots of fluids daily. °5. Most patients will experience some swelling and bruising in the area of the incisions.  Ice packs will help.  Swelling and bruising can take several days to resolve.  °6. It is common to experience some constipation if taking pain medication after surgery.  Increasing fluid intake and taking a stool softener (such as Colace) will usually help or prevent this problem from occurring.  A mild laxative (Milk of Magnesia or Miralax) should be taken according to package instructions if there are no bowel movements after 48 hours. °7. Unless discharge instructions indicate otherwise, you may remove your bandages 24-48 hours after surgery, and you may shower at that time.  You may have steri-strips (small skin tapes) in place directly over the incision.  These strips should be left on the skin for 7-10 days.  If your surgeon used skin glue on the incision, you may shower in 24 hours.  The glue will flake off over the next 2-3 weeks.  Any sutures or  staples will be removed at the office during your follow-up visit. °8. ACTIVITIES:  You may resume regular (light) daily activities beginning the next day--such as daily self-care, walking, climbing stairs--gradually increasing activities as tolerated.  You may have sexual intercourse when it is comfortable.  Refrain from any heavy lifting or straining until approved by your doctor. °a. You may drive when you are no longer taking prescription pain medication, you can comfortably wear a seatbelt, and you can safely maneuver your car and apply brakes. °b. RETURN TO WORK:  __________________________________________________________ °9. You should see your doctor in the office for a follow-up appointment approximately 2-3 weeks after your surgery.  Make sure that you call for this appointment within a day or two after you arrive home to insure a convenient appointment time. °10. OTHER INSTRUCTIONS: __________________________________________________________________________________________________________________________ __________________________________________________________________________________________________________________________ °WHEN TO CALL YOUR DOCTOR: °1. Fever over 101.0 °2. Inability to urinate °3. Continued bleeding from incision. °4. Increased pain, redness, or drainage from the incision. °5. Increasing abdominal pain ° °The clinic staff is available to answer your questions during regular business hours.  Please don’t hesitate to call and ask to speak to one of the nurses for clinical concerns.  If you have a medical emergency, go to the nearest emergency room or call 911.  A surgeon from [facility] is always on call at the hospital. °[address], [address], [place], [place]  [address] ? [address] [address], ,    [address] °[phone] ? [phone] ? FAX [phone] °Web site:   [email or web address] °

## 2016-09-16 NOTE — Discharge Summary (Signed)
  Patient ID: Yvette Bell [id number] 61 y.o. [birth year]  09/13/2016  Discharge date and time: 09/16/2016   Admitting Physician: Jackolyn Confer  Discharge Physician: Excell Seltzer T  Admission Diagnoses: Acute cholecystitis [K81.0] RUQ abdominal pain [R10.11]  Discharge Diagnoses: Same  Operations: Procedure(s): LAPAROSCOPIC CHOLECYSTECTOMY WITH INTRAOPERATIVE CHOLANGIOGRAM  Admission Condition: fair  Discharged Condition: good  Indication for Admission: She developed epigastric abdominal pain that radiated around to the right subscapular area today.  The pain progressively became worse and was associated with nausea.  No fever or chills.  She has not had anything like this before.  She presented to the emergency department for evaluation.  She is had findings consistent with acute cholecystitis and we were asked to see her  Hospital Course: Patient was admitted with an impression of acute cholecystitis. She was started on IV antibiotics. The following morning she underwent laparoscopic appendectomy with cholangiogram by Dr. Lucia Gaskins. The procedure went smoothly. The following morning she was having quite a bit of right upper quadrant pain and was not felt ready for discharge. She was observed for another 24 hours. Repeat lab work showed still some moderate leukocytosis but improving and normal LFTs. At the time of discharge her pain is markedly improved, she has minimal abdominal tenderness. She will continue some oral antibiotics for 5 day follow-up course due to operative findings and leukocytosis.   Disposition: Home  Patient Instructions:  Allergies as of 09/16/2016      Reactions   Morphine    REACTION: breathing issues   Pollen Extract Other (See Comments)   Seasonal allergy symptoms      Medication List    TAKE these medications   amoxicillin-clavulanate 875-125 MG tablet Commonly known as:  AUGMENTIN Take 1 tablet by mouth 2 (two) times daily.   EPINEPHrine 0.3  mg/0.3 mL Soaj injection Commonly known as:  EPI-PEN INJECT 0.3ML IM ONCE   fluticasone 50 MCG/ACT nasal spray Commonly known as:  FLONASE Place 2 sprays into both nostrils daily.   hydrochlorothiazide 25 MG tablet Commonly known as:  HYDRODIURIL TAKE ONE TABLET BY MOUTH DAILY AT BEDTIME. What changed:  See the new instructions.   magic mouthwash w/lidocaine Soln 5 ml swish and spit.   naproxen 500 MG tablet Commonly known as:  NAPROSYN Take 1 tablet (500 mg total) by mouth 2 (two) times daily with a meal. What changed:  when to take this  reasons to take this   Oxycodone HCl 10 MG Tabs Take 0.5 tablets (5 mg total) by mouth every 4 (four) hours as needed for moderate pain.            Discharge Care Instructions        Start     Ordered   09/16/16 0000  oxyCODONE 10 MG TABS  Every 4 hours PRN     09/16/16 1005   09/16/16 0000  amoxicillin-clavulanate (AUGMENTIN) 875-125 MG tablet  2 times daily     09/16/16 1005      Activity: activity as tolerated Diet: regular diet Wound Care: none needed  Follow-up:  With Dr. Lucia Gaskins in 3 weeks.  Signed: Edward Jolly MD, FACS  09/16/2016, 10:07 AM

## 2016-11-01 ENCOUNTER — Other Ambulatory Visit: Payer: Self-pay | Admitting: Internal Medicine

## 2016-11-01 DIAGNOSIS — I10 Essential (primary) hypertension: Secondary | ICD-10-CM

## 2016-12-18 ENCOUNTER — Ambulatory Visit (INDEPENDENT_AMBULATORY_CARE_PROVIDER_SITE_OTHER): Payer: Medicare Other | Admitting: Student in an Organized Health Care Education/Training Program

## 2016-12-18 VITALS — BP 140/80 | HR 95 | Temp 98.7°F | Ht 65.0 in | Wt 193.4 lb

## 2016-12-18 DIAGNOSIS — R319 Hematuria, unspecified: Secondary | ICD-10-CM | POA: Diagnosis not present

## 2016-12-18 DIAGNOSIS — R251 Tremor, unspecified: Secondary | ICD-10-CM

## 2016-12-18 DIAGNOSIS — R300 Dysuria: Secondary | ICD-10-CM

## 2016-12-18 DIAGNOSIS — N390 Urinary tract infection, site not specified: Secondary | ICD-10-CM

## 2016-12-18 LAB — POCT URINALYSIS DIP (MANUAL ENTRY)
BILIRUBIN UA: NEGATIVE
BILIRUBIN UA: NEGATIVE mg/dL
Glucose, UA: NEGATIVE mg/dL
Leukocytes, UA: NEGATIVE
Nitrite, UA: NEGATIVE
PH UA: 6.5 (ref 5.0–8.0)
Protein Ur, POC: 30 mg/dL — AB
SPEC GRAV UA: 1.025 (ref 1.010–1.025)
Urobilinogen, UA: 0.2 E.U./dL

## 2016-12-18 MED ORDER — CEPHALEXIN 500 MG PO CAPS: 500.0000 mg | ORAL_CAPSULE | Freq: Two times a day (BID) | ORAL | 0 refills | Status: DC

## 2016-12-18 NOTE — Progress Notes (Signed)
   CC: Dysuria  HPI: Yvette Bell is a 61 y.o. female who presents for dysuria. She complains of urinary frequency, urgency and dysuria x one week, without flank pain, fever, chills, or abnormal vaginal discharge or bleeding.  She does not history of previous urinary tract infections in the past. She also endorses feeling "shaky" or "tremulous" however has not noted any external tremors, has not had any falls, denies headaches or light headedness, no changes in vision. States it has been an internal feeling for the past week.  Review of Symptoms:  See HPI for ROS.   CC, SH/smoking status, and VS noted.  Objective: BP 140/80 (BP Location: Right Arm, Patient Position: Sitting, Cuff Size: Normal)   Pulse 95   Temp 98.7 F (37.1 C) (Oral)   Ht 5\' 5"  (1.651 m)   Wt 193 lb 6.4 oz (87.7 kg)   LMP 08/20/2011   SpO2 99%   BMI 32.18 kg/m  GEN: NAD, alert, cooperative, and pleasant. EYE: no conjunctival injection, pupils equally round and reactive to light RESPIRATORY: clear to auscultation bilaterally with no wheezes, rhonchi or rales, good effort CV: RRR, no m/r/g, no peripheral edema GI: soft, non-tender, non-distended SKIN: warm and dry, no rashes or lesions NEURO: II-XII grossly intact, normal gait, peripheral sensation intact PSYCH: AAOx3, appropriate affect  Urine dipstick shows positive for RBC's and positive for protein.  Micro exam: not done.   Assessment and plan:  UTI (urinary tract infection) Given history of dysuria, urinary frequency and urgency with protein and blood in urine, it is reasonable to treat for UTI. However, patient will need follow up UA in 6 weeks to ensure that blood in urine resolves. - Keflex 500 mg BID x7d - future lab order for UA placed - return precautions advised - will send urine for culture since patient endorses history of UTI  Tremulousness No outward signs of tremor and neuro exam is completely normal. For now we will treat UTI and monitor  this internal sensation of tremor. Patient advised to follow up if symptoms worsen.   Orders Placed This Encounter  Procedures  . Urine Culture  . Urine Microscopic    Standing Status:   Future    Standing Expiration Date:   12/18/2017  . POCT urinalysis dipstick    Meds ordered this encounter  Medications  . cephALEXin (KEFLEX) 500 MG capsule    Sig: Take 1 capsule (500 mg total) by mouth 2 (two) times daily.    Dispense:  14 capsule    Refill:  0     Everrett Coombe, MD,MS,  PGY2 12/26/2016 1:46 PM

## 2016-12-18 NOTE — Patient Instructions (Signed)
It was a pleasure seeing you today in our clinic. Today we discussed your urine infection. Here is the treatment plan we have discussed and agreed upon together:   Please complete your course of antibiotics for the next 7 days. If your urine culture grows bacteria that needs a different medication we will call you.   Your urine had blood in it. This can be seen with infection, however it would be best to recheck your urine after your infection is treated. Please come back for a lab visit in 6 weeks to give urine.  Our clinic's number is [phone]. Please call with questions or concerns about what we discussed today.  Be well, Dr. Burr Medico

## 2016-12-20 LAB — URINE CULTURE: Organism ID, Bacteria: NO GROWTH

## 2016-12-26 ENCOUNTER — Encounter: Payer: Self-pay | Admitting: Student in an Organized Health Care Education/Training Program

## 2016-12-26 DIAGNOSIS — R251 Tremor, unspecified: Secondary | ICD-10-CM

## 2016-12-26 HISTORY — DX: Tremor, unspecified: R25.1

## 2016-12-26 NOTE — Assessment & Plan Note (Signed)
No outward signs of tremor and neuro exam is completely normal. For now we will treat UTI and monitor this internal sensation of tremor. Patient advised to follow up if symptoms worsen.

## 2016-12-26 NOTE — Assessment & Plan Note (Signed)
Given history of dysuria, urinary frequency and urgency with protein and blood in urine, it is reasonable to treat for UTI. However, patient will need follow up UA in 6 weeks to ensure that blood in urine resolves. - Keflex 500 mg BID x7d - future lab order for UA placed - return precautions advised - will send urine for culture since patient endorses history of UTI

## 2017-03-04 ENCOUNTER — Other Ambulatory Visit: Payer: Self-pay | Admitting: Family Medicine

## 2017-03-04 DIAGNOSIS — Z139 Encounter for screening, unspecified: Secondary | ICD-10-CM

## 2017-03-08 ENCOUNTER — Encounter: Payer: Self-pay | Admitting: Internal Medicine

## 2017-03-08 ENCOUNTER — Other Ambulatory Visit: Payer: Self-pay

## 2017-03-08 ENCOUNTER — Ambulatory Visit (INDEPENDENT_AMBULATORY_CARE_PROVIDER_SITE_OTHER): Payer: Medicare Other | Admitting: Internal Medicine

## 2017-03-08 VITALS — BP 130/80 | HR 88 | Temp 98.0°F | Ht 65.0 in | Wt 193.4 lb

## 2017-03-08 DIAGNOSIS — D171 Benign lipomatous neoplasm of skin and subcutaneous tissue of trunk: Secondary | ICD-10-CM

## 2017-03-08 DIAGNOSIS — R319 Hematuria, unspecified: Secondary | ICD-10-CM

## 2017-03-08 DIAGNOSIS — M545 Low back pain: Secondary | ICD-10-CM

## 2017-03-08 DIAGNOSIS — R300 Dysuria: Secondary | ICD-10-CM | POA: Diagnosis present

## 2017-03-08 DIAGNOSIS — Y848 Other medical procedures as the cause of abnormal reaction of the patient, or of later complication, without mention of misadventure at the time of the procedure: Secondary | ICD-10-CM

## 2017-03-08 DIAGNOSIS — T8089XA Other complications following infusion, transfusion and therapeutic injection, initial encounter: Secondary | ICD-10-CM

## 2017-03-08 LAB — POCT URINALYSIS DIP (MANUAL ENTRY)
BILIRUBIN UA: NEGATIVE mg/dL
GLUCOSE UA: NEGATIVE mg/dL
Leukocytes, UA: NEGATIVE
Nitrite, UA: NEGATIVE
UROBILINOGEN UA: 0.2 U/dL
pH, UA: 5.5 (ref 5.0–8.0)

## 2017-03-08 MED ORDER — DICLOFENAC SODIUM 1 % TD GEL: 2.0000 g | Freq: Four times a day (QID) | TRANSDERMAL | 1 refills | Status: DC | PRN

## 2017-03-08 MED ORDER — CYCLOBENZAPRINE HCL 5 MG PO TABS: 5.0000 mg | ORAL_TABLET | Freq: Every evening | ORAL | 0 refills | Status: DC | PRN

## 2017-03-08 NOTE — Patient Instructions (Signed)
Ms. Mulgrew,  I will refer you to urology for continued pain and blood in urine.  Try voltaren gel over lower back and knees for pain. Capsaicin cream is an over the counter product that you could also try. Continue tylenol for arthritis pain.  Try flexeril 5 mg at bedtime for muscle spasm, tightness. This may make you a little sleepy.  Best, Dr. Ola Spurr

## 2017-03-09 NOTE — Progress Notes (Signed)
[facility] Progress Note  Subjective:  Yvette Bell is a 62 y.o. female with history of HTN, obesity, asthma, and hypothyroidsim who presents for burning with urination and body aches.  #Dysuria: - Patient reports this is intermittent. Does not occur every time she urinates but has been more frequent over the last 3 weeks. This has been an ongoing problem for years. - Says she has had blood in her urine most times she has had her urine checked; negative CT stone study 08/2016 - Took keflex at end of last year and does not think it affected her symptoms - Saw Urology many years ago. Does not think she has ever had cystoscopy.  - Has not been recently sexually active. Denies change in vaginal discharge.  - Denies abdominal pain.  ROS: No fevers, no n/v/d  #Body pains: - Reports soreness of right lower back that radiates down outer aspect of right leg - This has been a problem for last 4 days - Does not like taking medication but took a tylenol, which helps a little - She has increased her physical activity and is participating in a number of classes at her gym - Occasionally has tingling into her toes - No falls, no known injury - Also reports sensation of tightness of neck and lower back - Has been wearing lumbar back brace for chronic back pain (is on disability for this) ROS: No bowel or bladder incontinence, no weakness  Normal pap smear in 2015 with negative HPV.  Family history: Father had stomach cancer  Allergies  Allergen Reactions  . Morphine     REACTION: breathing issues  . Pollen Extract Other (See Comments)    Seasonal allergy symptoms    Social History   Tobacco Use  . Smoking status: Never Smoker  . Smokeless tobacco: Never Used  Substance Use Topics  . Alcohol use: Yes    Alcohol/week: 0.0 oz    Comment: Occassionally    Objective: Blood pressure 130/80, pulse 88, temperature 98 F (36.7 C), temperature source Oral, height 5\' 5"  (1.651  m), weight 193 lb 6.4 oz (87.7 kg), last menstrual period 08/20/2011, SpO2 99 %. Body mass index is 32.18 kg/m. Constitutional: Pleasant, obese female in NAD HENT: Mild swelling and erythema of nasal turbinates Cardiovascular: RRR, S1, S2, no m/r/g.  Pulmonary/Chest: Effort normal and breath sounds normal.  Abdominal: Soft. +BS, NT Musculoskeletal: No CVA tenderness. Mild discomfort on R with internal rotation of hip. No TTP over bilateral greater trochanters. TTP over bilateral lateral joint line of knees (R > L) and mild crepitus. No midline spinal TTP but mild TTP over bilateral lumbar paraspinal muscles, R > L.  Neurological: AOx3, no focal deficits. Patellar reflexes 2+ bilaterally. No radiation of symptoms with straight leg raise bilaterally.  Skin: Skin is warm and dry. No rash noted. ~2x4cm lipoma of left upper back, extending to start of neck.  Psychiatric: Normal mood and affect.  Vitals reviewed  Recent Results (from the past 2160 hour(s))  POCT urinalysis dipstick     Status: Abnormal   Collection Time: 12/18/16 11:44 AM  Result Value Ref Range   Color, UA yellow yellow   Clarity, UA clear clear   Glucose, UA negative negative mg/dL   Bilirubin, UA negative negative   Ketones, POC UA negative negative mg/dL   Spec Grav, UA 1.025 1.010 - 1.025   Blood, UA large (A) negative   pH, UA 6.5 5.0 - 8.0   Protein Ur, POC =  30 (A) negative mg/dL   Urobilinogen, UA 0.2 0.2 or 1.0 E.U./dL   Nitrite, UA Negative Negative   Leukocytes, UA Negative Negative  Urine Culture     Status: None   Collection Time: 12/18/16 12:22 PM  Result Value Ref Range   Urine Culture, Routine Final report    Organism ID, Bacteria No growth   POCT urinalysis dipstick     Status: Abnormal   Collection Time: 03/08/17 12:01 PM  Result Value Ref Range   Color, UA yellow yellow   Clarity, UA clear clear   Glucose, UA negative negative mg/dL   Bilirubin, UA small (A) negative   Ketones, POC UA negative  negative mg/dL   Spec Grav, UA >=1.030 (A) 1.010 - 1.025   Blood, UA large (A) negative   pH, UA 5.5 5.0 - 8.0   Protein Ur, POC trace (A) negative mg/dL   Urobilinogen, UA 0.2 0.2 or 1.0 E.U./dL   Nitrite, UA Negative Negative   Leukocytes, UA Negative Negative  Urine Culture     Status: None   Collection Time: 03/08/17  2:16 PM  Result Value Ref Range   Urine Culture, Routine Final report    Organism ID, Bacteria No growth    Urine micro on 6/18 and 8/18 showed 6-30 RBC/hpf.  SCr 0.75 in 8/18.  Assessment/Plan: Dysuria - Intermittent. UA without evidence of infection but does show hematuria. This has been seen on multiple UAs for patient over the last year with negative urine culture. Will refer to Urology, as would want to rule out bladder cancer or urethral abnormality. Could be secondary to interstitial cystitis. Recent scan negative for kidney stones.  - Will obtain urine culture to confirm lack of infection - Urology referral  Acute right-sided low back pain - Acute on chronic back pain in setting of increased exercise. Suspect muscle strain. Has bilateral knee crepitus and joint line tenderness and some discomfort of right hip with internal rotation, so OA may also be contributing. - Recommended continuing tylenol as needed. Can try voltaren gel over lower back and knees.  - Try flexeril 5 mg at bedtime if has severe muscle tightness/spasm  Lipoma of torso - Patient interested in surgical consultation for possible removal; thinks may be growing but does not cause her discomfort  Follow-up in about a month to ensure pain improving; would check A1c at that time.   Olene Floss, MD [place], PGY-3

## 2017-03-10 LAB — URINE CULTURE: ORGANISM ID, BACTERIA: NO GROWTH

## 2017-03-11 ENCOUNTER — Encounter: Payer: Self-pay | Admitting: Internal Medicine

## 2017-03-11 DIAGNOSIS — D171 Benign lipomatous neoplasm of skin and subcutaneous tissue of trunk: Secondary | ICD-10-CM | POA: Insufficient documentation

## 2017-03-11 DIAGNOSIS — M545 Low back pain, unspecified: Secondary | ICD-10-CM | POA: Insufficient documentation

## 2017-03-11 HISTORY — DX: Benign lipomatous neoplasm of skin and subcutaneous tissue of trunk: D17.1

## 2017-03-11 NOTE — Assessment & Plan Note (Signed)
-   Intermittent. UA without evidence of infection but does show hematuria. This has been seen on multiple UAs for patient over the last year with negative urine culture. Will refer to Urology, as would want to rule out bladder cancer or urethral abnormality. Could be secondary to interstitial cystitis. Recent scan negative for kidney stones.  - Will obtain urine culture to confirm lack of infection - Urology referral

## 2017-03-11 NOTE — Assessment & Plan Note (Addendum)
-   Acute on chronic back pain in setting of increased exercise. Suspect muscle strain. Has bilateral knee crepitus and joint line tenderness and some discomfort of right hip with internal rotation, so OA may also be contributing. - Recommended continuing tylenol as needed. Can try voltaren gel over lower back and knees.  - Try flexeril 5 mg at bedtime if has severe muscle tightness/spasm

## 2017-03-11 NOTE — Assessment & Plan Note (Signed)
-   Patient interested in surgical consultation for possible removal; thinks may be growing but does not cause her discomfort

## 2017-03-26 ENCOUNTER — Ambulatory Visit
Admission: RE | Admit: 2017-03-26 | Discharge: 2017-03-26 | Disposition: A | Discharge status: Home / Self Care | Payer: Medicare Other | Source: Ambulatory Visit | Attending: Family Medicine | Admitting: Family Medicine

## 2017-03-26 DIAGNOSIS — Z139 Encounter for screening, unspecified: Secondary | ICD-10-CM

## 2017-04-12 ENCOUNTER — Ambulatory Visit: Payer: Self-pay | Admitting: Surgery

## 2017-04-12 NOTE — H&P (Signed)
**Note Bell-Identified via Obfuscation**   History of Present Illness Yvette Bell. Yvette Delaney Yvette Bell; 04/12/2017 11:56 AM) The patient is a 62 year old female who presents with a complaint of Mass. 62 year old female status post laparoscopic cholecystectomy by Dr. Lucia Gaskins in 2018 presents with several years of an enlarging mass on her upper back near the base of her neck. This has become uncomfortable. It continues to enlarge. It causes some tenderness around this area. It has never become infected. She would like to have it removed.   Problem List/Past Medical Yvette Key K. Johnie Makki, Yvette Bell; 04/12/2017 11:58 AM) LIPOMA OF BACK (D17.1) S/P LAPAROSCOPIC CHOLECYSTECTOMY (Z90.49) [10/05/2016]: ACUTE CHOLECYSTITIS (K81.0) [10/05/2016]:  Past Surgical History Yvette Key K. Namiko Pritts, Yvette Bell; 04/12/2017 11:58 AM) Open Inguinal Hernia Surgery Bilateral.  Diagnostic Studies History Yvette Key K. Jawan Chavarria, Yvette Bell; 04/12/2017 11:58 AM) Colonoscopy 1-5 years ago  Allergies Yvette Bell, Yvette Bell; 04/12/2017 10:35 AM) Environmental allergens Dog Dander Morphine Sulfate (PF) *ANALGESICS - OPIOID* Allergies Reconciled  Medication History (Yvette Bell, Yvette Bell; 04/12/2017 10:35 AM) HydroCHLOROthiazide (25MG  Tablet, Oral) Active. Medications Reconciled  Social History Yvette Bell. Yvette Fazekas, Yvette Bell; 04/12/2017 11:58 AM) Alcohol use Occasional alcohol use.  Family History Yvette Bell. Yvette Rockett, Yvette Bell; 04/12/2017 11:58 AM) Alcohol Abuse Family Members In General, Father. Arthritis Family Members In General, Father. Cerebrovascular Accident Father. Colon Polyps Sister. Diabetes Mellitus Sister. Hypertension Brother, Daughter.  Pregnancy / Birth History Yvette Bell. Yvette Hickam, Yvette Bell; 04/12/2017 11:58 AM) Age at menarche 36 years. Age of menopause 93-50 Contraceptive History Oral contraceptives. Maternal age 75-25 Para 3  Other Problems Yvette Bell. Yvette Deeg, Yvette Bell; 04/12/2017 11:58 AM) Anxiety Disorder Arthritis Depression Gastroesophageal Reflux Disease    Vitals  (Yvette Bell Yvette Bell; 04/12/2017 10:35 AM) 04/12/2017 10:35 AM Weight: 198.38 lb Height: 65in Body Surface Area: 1.97 m Body Mass Index: 33.01 kg/m  Temp.: 98.54F(Oral)  Pulse: 94 (Regular)  BP: 166/100 (Sitting, Right Arm, Standard)      Physical Exam Yvette Key K. Adore Kithcart Yvette Bell; 04/12/2017 11:57 AM)  The physical exam findings are as follows: Note:WDWN in NAD Eyes: Pupils equal, round; sclera anicteric HENT: Oral mucosa moist; good dentition Neck: No thyromegaly; base of neck/ upper back left of midline - protruding 3-4 cm subcutaneous mass, firm, smooth, no skin changes Lungs: CTA bilaterally; normal respiratory effort CV: Regular rate and rhythm; no murmurs; extremities well-perfused with no edema Abd: +bowel sounds, soft, non-tender, no palpable organomegaly; no palpable hernias Skin: Warm, dry; no sign of jaundice Psychiatric - alert and oriented x 4; calm mood and affect    Assessment & Plan Yvette Key K. Santonio Speakman Yvette Bell; 04/12/2017 10:44 AM)  LIPOMA OF BACK (D17.1) Impression: 4 cm upper back subcutaneous  Current Plans Schedule for Surgery - Excision of subcutaneous lipoma - upper back. The surgical procedure has been discussed with the patient. Potential risks, benefits, alternative treatments, and expected outcomes have been explained. All of the patient's questions at this time have been answered. The likelihood of reaching the patient's treatment goal is good. The patient understand the proposed surgical procedure and wishes to proceed.  Yvette Bell. Georgette Dover, Yvette Bell, [facility] Surgery  General/ Trauma Surgery  04/12/2017 11:58 AM

## 2017-04-23 ENCOUNTER — Other Ambulatory Visit: Payer: Self-pay | Admitting: Surgery

## 2017-07-26 ENCOUNTER — Other Ambulatory Visit: Payer: Self-pay

## 2017-07-26 DIAGNOSIS — I10 Essential (primary) hypertension: Secondary | ICD-10-CM

## 2017-07-28 MED ORDER — HYDROCHLOROTHIAZIDE 25 MG PO TABS: 25.0000 mg | ORAL_TABLET | Freq: Every day | ORAL | 3 refills | Status: DC

## 2017-09-19 IMAGING — MR MR SHOULDER*L* W/O CM
4 of 5 series · 19 of 40 positions shown · non-contrast
Comparison: None.

CLINICAL DATA: Left shoulder pain for 1 year. Mass along the
trapezius. Tenderness along the supraspinatus and long head biceps.

EXAM:
MRI OF THE LEFT SHOULDER WITHOUT CONTRAST
TECHNIQUE: Multiplanar, multisequence MR imaging of the shoulder was performed.
No intravenous contrast was administered.

[Series 7: T2 fat-sat · oblique · 3.0mm · 0.31mm/px · 6 of 18 slices shown (1 of 3)]
[im 1/18]
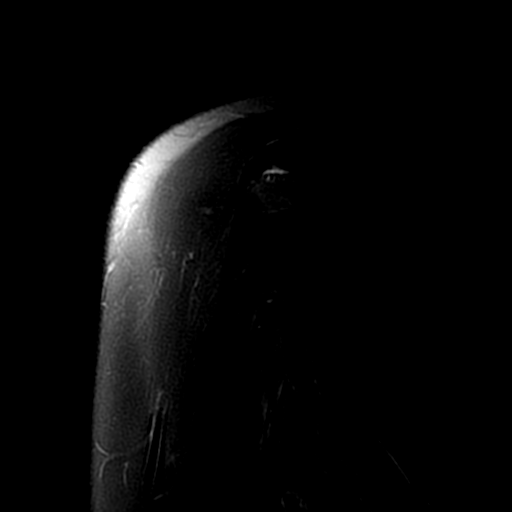
[im 4/18]
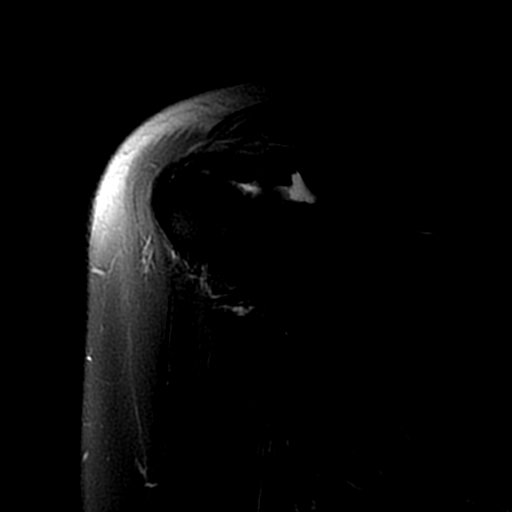
[im 7/18]
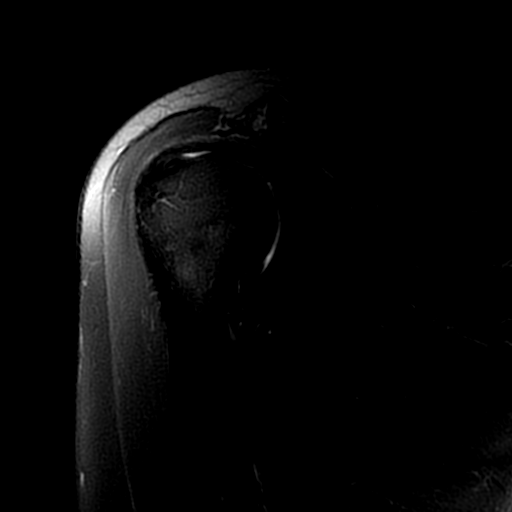
[im 11/18]
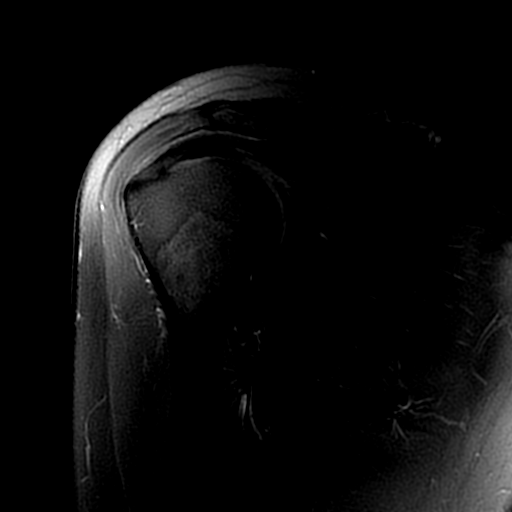
[im 14/18]
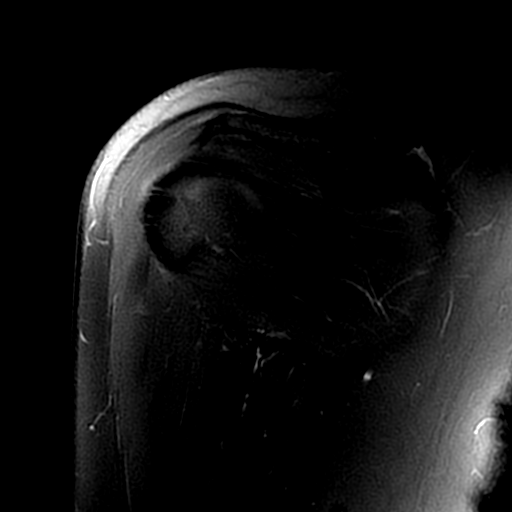
[im 18/18]
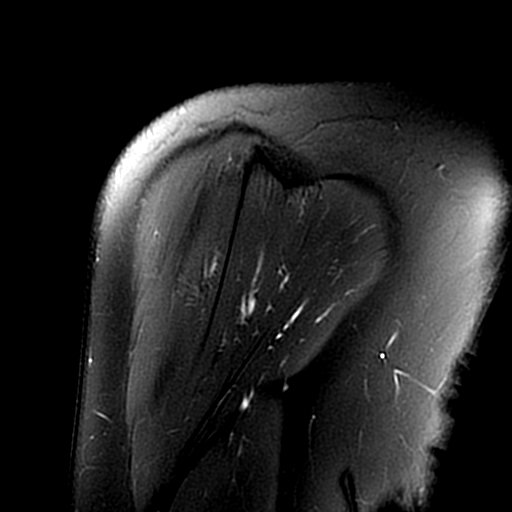

[Series 8: T2 fat-sat · axial · 3.0mm · 0.23mm/px · z∈[-50,+3]mm · 3 of 23 slices shown (2 of 3)]
[im 3/23]
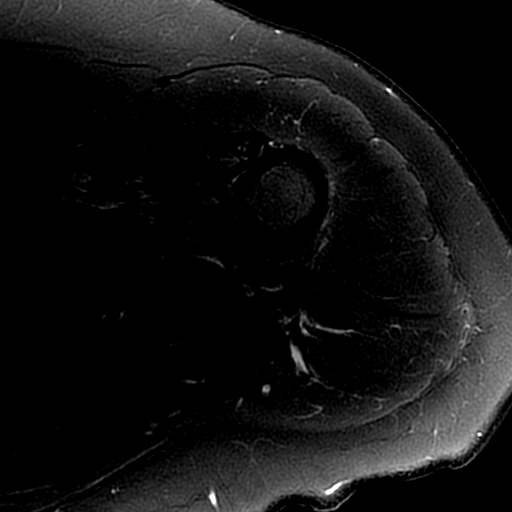
[im 12/23]
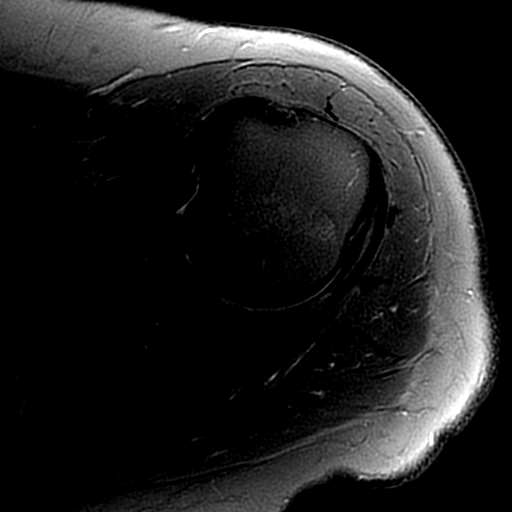
[im 20/23]
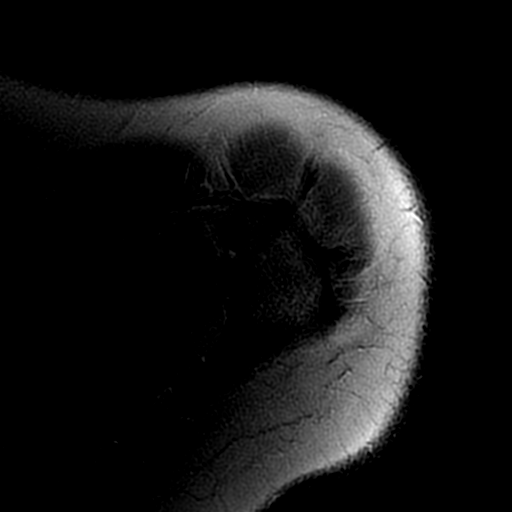

[Series 9: PD · oblique · 3.0mm · 0.31mm/px · 7 of 18 slices shown]
[im 1/18]
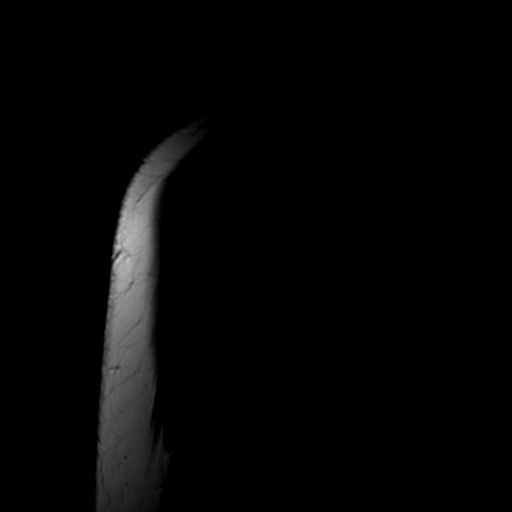
[im 3/18]
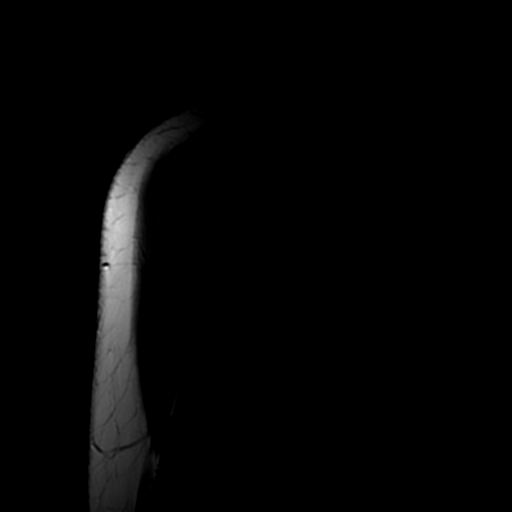
[im 6/18]
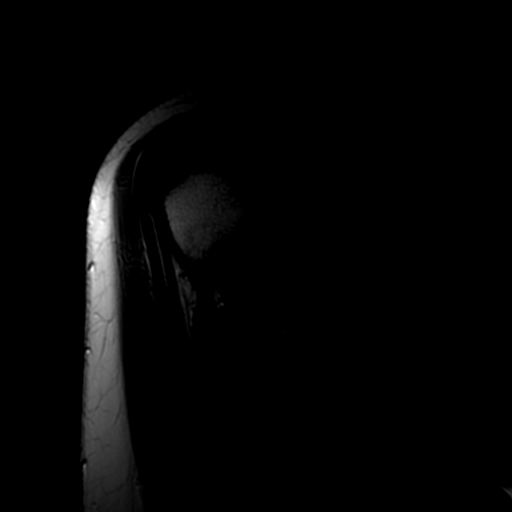
[im 9/18]
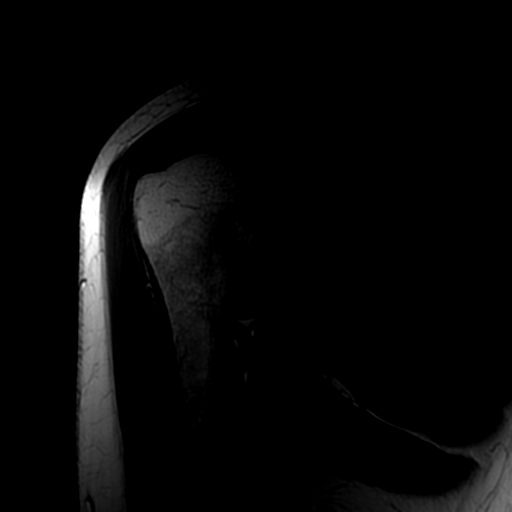
[im 12/18]
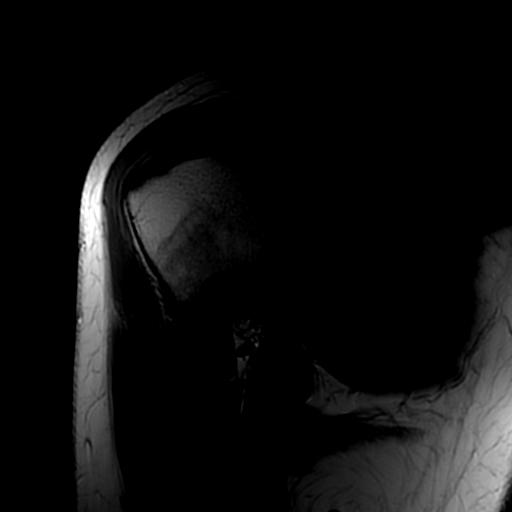
[im 15/18]
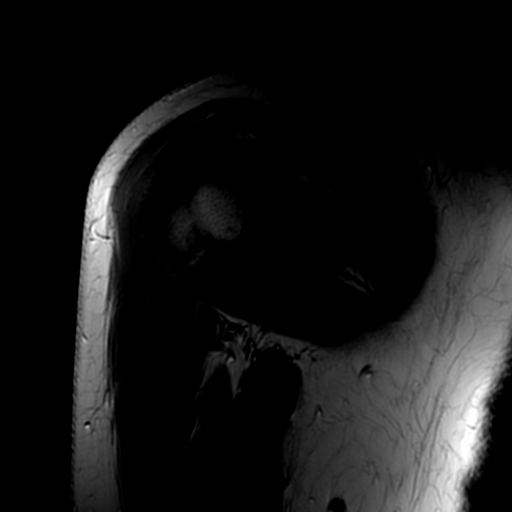
[im 18/18]
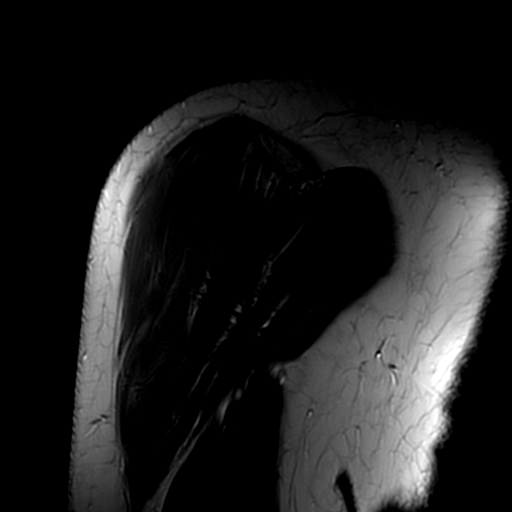

[Series 10: T2 fat-sat · oblique · 3.0mm · 0.27mm/px · 3 of 22 slices shown (3 of 3)]
[im 3/22]
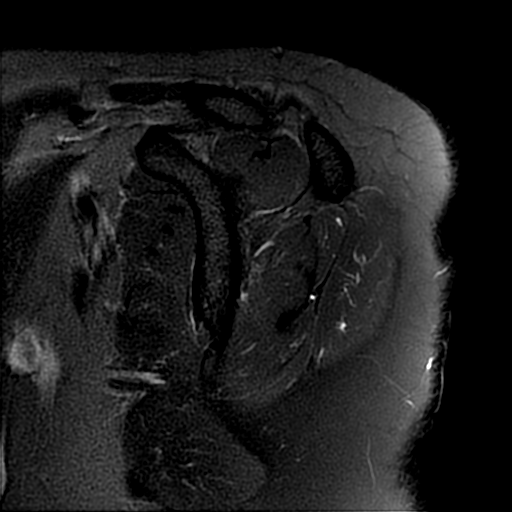
[im 11/22]
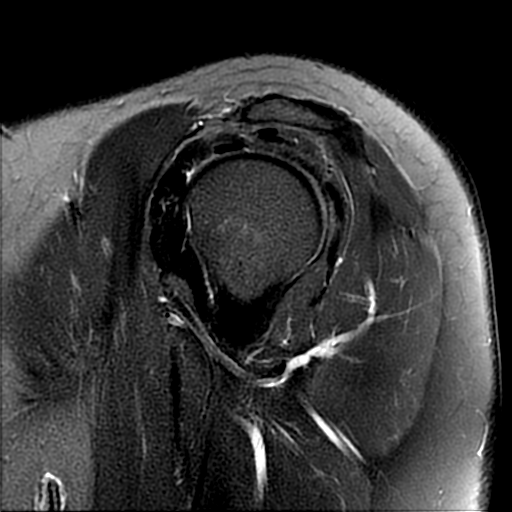
[im 19/22]
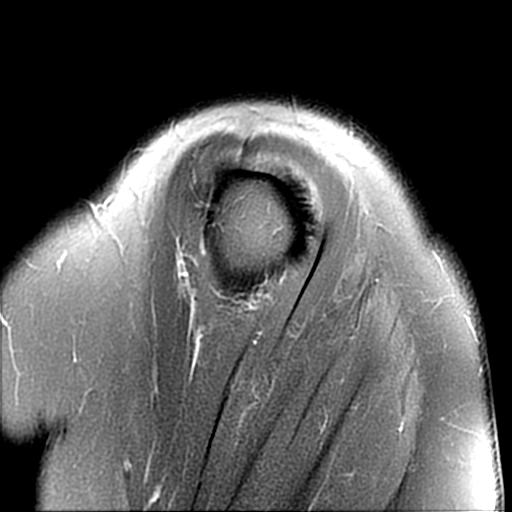

[19 of 40 positions shown; findings below may reference images not displayed]

FINDINGS: Rotator cuff:  Mild supraspinatus tendinopathy distally.

Muscles:  Unremarkable

Biceps long head:  Unremarkable

Acromioclavicular Joint: Mild degenerative spurring. Type II
acromion. No significant abnormal fluid in the subacromial
subdeltoid bursa.

Glenohumeral Joint: Preserved articular cartilage, no joint
effusion. Borderline thickened inferior glenohumeral ligament. No
synovitis along the rotator interval.

Labrum:  Grossly unremarkable

Bones: No significant extra-articular osseous abnormalities
identified.

Other: No supplemental non-categorized findings.
IMPRESSION: 1. Mild supraspinatus tendinopathy distally but otherwise no
significant internal derangement to explain the patient' s left
shoulder symptoms.
2. Mild degenerative AC joint spurring.
3. Reported history indicates the patient may have a mass over the
lower left neck in the trapezius region. Please note that whill the
distal most portion of the trapezius is seen attaching to the
clavicle, the more medial trapezius is not included in the
orthopedic shoulder protocol MRI exam. If assessment of the
trapezius in the vicinity of the neck is warranted, a dedicated neck
exam would be required.

## 2017-10-02 ENCOUNTER — Telehealth: Payer: Self-pay

## 2017-10-02 ENCOUNTER — Telehealth: Payer: Self-pay | Admitting: Family Medicine

## 2017-10-02 NOTE — Telephone Encounter (Signed)
Called patient about her hip pain.  She stated it had gotten a little better with ibuprofen. Patient stated she would call for appointment if it still was hurting tomorrow.

## 2017-10-02 NOTE — Telephone Encounter (Signed)
Pt called nurse line for medication advice. Pt stated she is having lower right hip spasms that started yesterday and became worse over night. Pt stated she has been taking 600mg  of ibuprofen every 6hrs with minimal relief. I informed pt that ibuprofen may not be the best for muscle spasms and offered her an apt for further evaluation and medication management. She declined. Pt would like pcp to call her.

## 2017-10-03 ENCOUNTER — Encounter (HOSPITAL_COMMUNITY): Payer: Self-pay | Admitting: Emergency Medicine

## 2017-10-03 ENCOUNTER — Emergency Department (HOSPITAL_COMMUNITY)
Admission: EM | Admit: 2017-10-03 | Discharge: 2017-10-03 | Disposition: A | Discharge status: Home / Self Care | Payer: Medicare Other | Attending: Emergency Medicine | Admitting: Emergency Medicine

## 2017-10-03 DIAGNOSIS — I10 Essential (primary) hypertension: Secondary | ICD-10-CM | POA: Diagnosis not present

## 2017-10-03 DIAGNOSIS — Z79899 Other long term (current) drug therapy: Secondary | ICD-10-CM | POA: Insufficient documentation

## 2017-10-03 DIAGNOSIS — Y939 Activity, unspecified: Secondary | ICD-10-CM | POA: Insufficient documentation

## 2017-10-03 DIAGNOSIS — J45909 Unspecified asthma, uncomplicated: Secondary | ICD-10-CM | POA: Insufficient documentation

## 2017-10-03 DIAGNOSIS — Y999 Unspecified external cause status: Secondary | ICD-10-CM | POA: Diagnosis not present

## 2017-10-03 DIAGNOSIS — S39012A Strain of muscle, fascia and tendon of lower back, initial encounter: Secondary | ICD-10-CM

## 2017-10-03 DIAGNOSIS — Y929 Unspecified place or not applicable: Secondary | ICD-10-CM | POA: Diagnosis not present

## 2017-10-03 DIAGNOSIS — E039 Hypothyroidism, unspecified: Secondary | ICD-10-CM | POA: Insufficient documentation

## 2017-10-03 DIAGNOSIS — X58XXXA Exposure to other specified factors, initial encounter: Secondary | ICD-10-CM | POA: Diagnosis not present

## 2017-10-03 DIAGNOSIS — S3992XA Unspecified injury of lower back, initial encounter: Secondary | ICD-10-CM | POA: Diagnosis present

## 2017-10-03 LAB — URINALYSIS, ROUTINE W REFLEX MICROSCOPIC
BILIRUBIN URINE: NEGATIVE
Glucose, UA: NEGATIVE mg/dL
KETONES UR: NEGATIVE mg/dL
LEUKOCYTES UA: NEGATIVE
NITRITE: NEGATIVE
PROTEIN: NEGATIVE mg/dL
SPECIFIC GRAVITY, URINE: 1.019 (ref 1.005–1.030)
pH: 5 (ref 5.0–8.0)

## 2017-10-03 MED ORDER — ONDANSETRON HCL 4 MG/2ML IJ SOLN
4.0000 mg | Freq: Once | INTRAMUSCULAR | Status: AC
  Administered 2017-10-03: 4 mg via INTRAVENOUS
  Filled 2017-10-03: qty 2

## 2017-10-03 MED ORDER — DIAZEPAM 5 MG PO TABS: 5.0000 mg | ORAL_TABLET | Freq: Two times a day (BID) | ORAL | 0 refills | Status: DC

## 2017-10-03 MED ORDER — CELECOXIB 200 MG PO CAPS: 200.0000 mg | ORAL_CAPSULE | Freq: Two times a day (BID) | ORAL | 0 refills | Status: DC

## 2017-10-03 MED ORDER — LORAZEPAM 2 MG/ML IJ SOLN
1.0000 mg | Freq: Once | INTRAMUSCULAR | Status: AC
  Administered 2017-10-03: 1 mg via INTRAVENOUS
  Filled 2017-10-03: qty 1

## 2017-10-03 MED ORDER — FENTANYL CITRATE (PF) 100 MCG/2ML IJ SOLN
50.0000 ug | Freq: Once | INTRAMUSCULAR | Status: AC
  Administered 2017-10-03: 50 ug via INTRAVENOUS
  Filled 2017-10-03: qty 2

## 2017-10-03 NOTE — ED Notes (Signed)
Bed: VT91 Expected date:  Expected time:  Means of arrival:  Comments: 62 yr old back pain

## 2017-10-03 NOTE — ED Notes (Signed)
Discharge instructions reviewed with patient. Patient verbalizes understanding. VSS.   

## 2017-10-03 NOTE — ED Provider Notes (Signed)
[facility] DEPT Provider Note   CSN: [id number] Arrival date & time: 10/03/17  4627     History   Chief Complaint Chief Complaint  Patient presents with  . Back Pain    HPI Yvette Bell is a 62 y.o. female.  Pt presents to the ED today with right sided back pain.  She has been having pain and spasms in her right lower back for the past few days.  She has taken ibuprofen, but it is not helping.  She denies abd pain, urinary sx, n/v, f/c.     Past Medical History:  Diagnosis Date  . Allergy   . Anxiety   . Arthritis   . Asthma   . Cataract    bil eyes  . Depression   . Fibroid tumor   . GERD (gastroesophageal reflux disease)   . Hyperlipidemia   . Hypertension   . Thyroid disease    hyperactive    Patient Active Problem List   Diagnosis Date Noted  . Acute right-sided low back pain 03/11/2017  . Lipoma of torso 03/11/2017  . Tremulousness 12/26/2016  . Acute cholecystitis 09/14/2016  . Seasonal allergic rhinitis due to pollen 09/06/2016  . Mild intermittent asthma, uncomplicated 03/50/0938  . Food allergy 07/31/2016  . [place] 07/31/2016  . Mass on back 10/25/2015  . Dysuria 09/14/2015  . UTI (urinary tract infection) 03/04/2015  . Skin sensitivity 01/22/2015  . Rectal bleeding 06/17/2014  . 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 Goiter 02/17/2014  . Heart murmur, systolic 18/29/9371  . NECK SPASM 02/02/2010  . ASCUS PAP 11/25/2009  . HYPOKALEMIA 03/30/2009  . OBESITY 01/19/2008  . HYPOTHYROIDISM, HX OF 07/05/2006  . UTERINE FIBROID 03/21/2006  . HYPERTENSION, BENIGN SYSTEMIC 03/21/2006    Past Surgical History:  Procedure Laterality Date  . cesearean section    . CHOLECYSTECTOMY N/A 09/14/2016   Procedure: LAPAROSCOPIC CHOLECYSTECTOMY WITH INTRAOPERATIVE CHOLANGIOGRAM;  Surgeon: Alphonsa Overall, MD;  Location: [facility] ORS;  Service: General;  Laterality: N/A;  . HERNIA REPAIR       OB History     None      Home Medications    Prior to Admission medications   Medication Sig Start Date End Date Taking? Authorizing Provider  amoxicillin-clavulanate (AUGMENTIN) 875-125 MG tablet Take 1 tablet by mouth 2 (two) times daily. 09/16/16   Excell Seltzer, MD  celecoxib (CELEBREX) 200 MG capsule Take 1 capsule (200 mg total) by mouth 2 (two) times daily. 10/03/17   Isla Pence, MD  cephALEXin (KEFLEX) 500 MG capsule Take 1 capsule (500 mg total) by mouth 2 (two) times daily. 12/18/16   Everrett Coombe, MD  cyclobenzaprine (FLEXERIL) 5 MG tablet Take 1 tablet (5 mg total) by mouth at bedtime as needed for muscle spasms. 03/08/17   Rogue Bussing, MD  diazepam (VALIUM) 5 MG tablet Take 1 tablet (5 mg total) by mouth 2 (two) times daily. 10/03/17   Isla Pence, MD  diclofenac sodium (VOLTAREN) 1 % GEL Apply 2 g topically 4 (four) times daily as needed. 03/08/17   Rogue Bussing, MD  EPINEPHrine 0.3 mg/0.3 mL IJ SOAJ injection INJECT 0.3ML IM ONCE 07/31/16   [provider]  fluticasone (FLONASE) 50 MCG/ACT nasal spray Place 2 sprays into both nostrils daily. 09/06/16   Valentina Shaggy, MD  hydrochlorothiazide (HYDRODIURIL) 25 MG tablet TAKE 1 TABLET BY MOUTH AT BEDTIME 11/02/16   Nicolette Bang, DO  hydrochlorothiazide (HYDRODIURIL) 25 MG tablet  Take 1 tablet (25 mg total) by mouth at bedtime. 07/28/17   Benay Pike, MD  magic mouthwash w/lidocaine SOLN 5 ml swish and spit. Patient not taking: Reported on 09/13/2016 09/13/16   Nicolette Bang, DO  naproxen (NAPROSYN) 500 MG tablet Take 1 tablet (500 mg total) by mouth 2 (two) times daily with a meal. Patient taking differently: Take 500 mg by mouth 2 (two) times daily as needed for mild pain or moderate pain.  10/18/15   Mercy Riding, MD  oxyCODONE 10 MG TABS Take 0.5 tablets (5 mg total) by mouth every 4 (four) hours as needed for moderate pain. 09/16/16   Excell Seltzer, MD    Family  History Family History  Problem Relation Age of Onset  . Stomach cancer Father   . Colon polyps Sister   . Diabetes Sister   . Colon cancer Neg Hx   . Esophageal cancer Neg Hx   . Gallbladder disease Neg Hx   . Rectal cancer Neg Hx     Social History Social History   Tobacco Use  . Smoking status: Never Smoker  . Smokeless tobacco: Never Used  Substance Use Topics  . Alcohol use: Yes    Alcohol/week: 0.0 standard drinks    Comment: Occassionally  . Drug use: No     Allergies   Morphine and Pollen extract   Review of Systems Review of Systems  Musculoskeletal: Positive for back pain.  All other systems reviewed and are negative.    Physical Exam Updated Vital Signs BP 140/82   Pulse 71   Temp 98.4 F (36.9 C) (Oral)   Resp 20   LMP 08/20/2011   SpO2 97%   Physical Exam  Constitutional: She is oriented to person, place, and time. She appears well-developed and well-nourished.  HENT:  Head: Normocephalic and atraumatic.  Right Ear: External ear normal.  Left Ear: External ear normal.  Nose: Nose normal.  Mouth/Throat: Oropharynx is clear and moist.  Eyes: Pupils are equal, round, and reactive to light. Conjunctivae and EOM are normal.  Neck: Normal range of motion. Neck supple.  Cardiovascular: Normal rate, regular rhythm, normal heart sounds and intact distal pulses.  Pulmonary/Chest: Effort normal and breath sounds normal.  Abdominal: Soft. Bowel sounds are normal.  Musculoskeletal:       Arms: Neurological: She is alert and oriented to person, place, and time.  Skin: Skin is warm. Capillary refill takes less than 2 seconds.  Psychiatric: She has a normal mood and affect. Her behavior is normal. Judgment and thought content normal.  Nursing note and vitals reviewed.    ED Treatments / Results  Labs (all labs ordered are listed, but only abnormal results are displayed) Labs Reviewed  URINALYSIS, ROUTINE W REFLEX MICROSCOPIC - Abnormal; Notable  for the following components:      Result Value   Hgb urine dipstick MODERATE (*)    Bacteria, UA RARE (*)    All other components within normal limits    EKG None  Radiology No results found.  Procedures Procedures (including critical care time)  Medications Ordered in ED Medications  LORazepam (ATIVAN) injection 1 mg (1 mg Intravenous Given 10/03/17 0740)  ondansetron (ZOFRAN) injection 4 mg (4 mg Intravenous Given 10/03/17 0740)  fentaNYL (SUBLIMAZE) injection 50 mcg (50 mcg Intravenous Given 10/03/17 0740)     Initial Impression / Assessment and Plan / ED Course  I have reviewed the triage vital signs and the nursing notes.  Pertinent  labs & imaging results that were available during my care of the patient were reviewed by me and considered in my medical decision making (see chart for details).    Pt is feeling much better after treatment with ativan/fentanyl.  She is encouraged to f/u with pcp.  Return if worse.  Final Clinical Impressions(s) / ED Diagnoses   Final diagnoses:  Strain of lumbar region, initial encounter    ED Discharge Orders         Ordered    diazepam (VALIUM) 5 MG tablet  2 times daily     10/03/17 0808    celecoxib (CELEBREX) 200 MG capsule  2 times daily     10/03/17 3094           Isla Pence, MD 10/03/17 [phone]

## 2017-10-03 NOTE — ED Triage Notes (Signed)
Pt comes to ed via ems, c/o back pain chronic for 1 week, on right side to left side across. V/s are 146/90, pulse 78, rr16, spo2 98 on room air.  Pt comes from home.

## 2017-11-25 ENCOUNTER — Encounter (HOSPITAL_COMMUNITY): Payer: Self-pay | Admitting: Emergency Medicine

## 2017-11-25 ENCOUNTER — Emergency Department (HOSPITAL_COMMUNITY)
Admission: EM | Admit: 2017-11-25 | Discharge: 2017-11-25 | Disposition: A | Discharge status: Home / Self Care | Payer: Medicare Other | Attending: Emergency Medicine | Admitting: Emergency Medicine

## 2017-11-25 ENCOUNTER — Other Ambulatory Visit: Payer: Self-pay

## 2017-11-25 DIAGNOSIS — Z79899 Other long term (current) drug therapy: Secondary | ICD-10-CM | POA: Diagnosis not present

## 2017-11-25 DIAGNOSIS — E039 Hypothyroidism, unspecified: Secondary | ICD-10-CM | POA: Diagnosis not present

## 2017-11-25 DIAGNOSIS — M79674 Pain in right toe(s): Secondary | ICD-10-CM

## 2017-11-25 DIAGNOSIS — I10 Essential (primary) hypertension: Secondary | ICD-10-CM | POA: Insufficient documentation

## 2017-11-25 DIAGNOSIS — J45909 Unspecified asthma, uncomplicated: Secondary | ICD-10-CM | POA: Insufficient documentation

## 2017-11-25 MED ORDER — DIPHENHYDRAMINE-ZINC ACETATE 2-0.1 % EX CREA: 1.0000 "application " | TOPICAL_CREAM | Freq: Three times a day (TID) | CUTANEOUS | 0 refills | Status: DC | PRN

## 2017-11-25 NOTE — ED Triage Notes (Signed)
Pt C/O right great toe pain since yesterday evening.

## 2017-11-25 NOTE — ED Notes (Signed)
RN made aware of elevated BP 

## 2017-11-25 NOTE — Discharge Instructions (Signed)
Please take medications as prescribed.  Please monitor your symptoms closely and follow-up with your PCP later this week.  Please return to the emergency room immediately if you experience any new or worsening symptoms or any symptoms that indicate worsening infection such as fevers, increased redness/swelling/pain, warmth, or drainage from the affected area.

## 2017-11-25 NOTE — ED Notes (Signed)
BP taken with sweater on.

## 2017-11-25 NOTE — ED Provider Notes (Signed)
[facility] DEPT Provider Note   CSN: [id number] Arrival date & time: 11/25/17  1724     History   Chief Complaint Chief Complaint  Patient presents with  . Toe Pain    HPI Yvette Bell is a 62 y.o. female.  HPI  Patient is a 62 year old female with a history of anxiety/depression, asthma, GERD, hyperlipidemia, hypertension, hyperthyroidism, who presents the emergency department today complaining of pain to the right great toe that began when she woke up this morning.  States pain is mild and intermittent.  Pain only present when she bends her toe.  States that it is associated with some redness distally.  There is also some associated itching and burning.  States that she felt like she got bit by something last night.  Denies fevers or chills.  Patient has no history of diabetes.  There is been no drainage from the toe.  Past Medical History:  Diagnosis Date  . Allergy   . Anxiety   . Arthritis   . Asthma   . Cataract    bil eyes  . Depression   . Fibroid tumor   . GERD (gastroesophageal reflux disease)   . Hyperlipidemia   . Hypertension   . Thyroid disease    hyperactive    Patient Active Problem List   Diagnosis Date Noted  . Acute right-sided low back pain 03/11/2017  . Lipoma of torso 03/11/2017  . Tremulousness 12/26/2016  . Acute cholecystitis 09/14/2016  . Seasonal allergic rhinitis due to pollen 09/06/2016  . Mild intermittent asthma, uncomplicated 70/62/3762  . Food allergy 07/31/2016  . Blackhead 07/31/2016  . Mass on back 10/25/2015  . Dysuria 09/14/2015  . UTI (urinary tract infection) 03/04/2015  . Skin sensitivity 01/22/2015  . Rectal bleeding 06/17/2014  . 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 Goiter 02/17/2014  . Heart murmur, systolic 83/15/1761  . NECK SPASM 02/02/2010  . ASCUS PAP 11/25/2009  . HYPOKALEMIA 03/30/2009  . OBESITY 01/19/2008  . HYPOTHYROIDISM, HX OF 07/05/2006  .  UTERINE FIBROID 03/21/2006  . HYPERTENSION, BENIGN SYSTEMIC 03/21/2006    Past Surgical History:  Procedure Laterality Date  . cesearean section    . CHOLECYSTECTOMY N/A 09/14/2016   Procedure: LAPAROSCOPIC CHOLECYSTECTOMY WITH INTRAOPERATIVE CHOLANGIOGRAM;  Surgeon: Alphonsa Overall, MD;  Location: [facility];  Service: General;  Laterality: N/A;  . HERNIA REPAIR       OB History   None      Home Medications    Prior to Admission medications   Medication Sig Start Date End Date Taking? Authorizing Provider  amoxicillin-clavulanate (AUGMENTIN) 875-125 MG tablet Take 1 tablet by mouth 2 (two) times daily. 09/16/16   Excell Seltzer, MD  celecoxib (CELEBREX) 200 MG capsule Take 1 capsule (200 mg total) by mouth 2 (two) times daily. 10/03/17   Isla Pence, MD  cephALEXin (KEFLEX) 500 MG capsule Take 1 capsule (500 mg total) by mouth 2 (two) times daily. 12/18/16   Everrett Coombe, MD  cyclobenzaprine (FLEXERIL) 5 MG tablet Take 1 tablet (5 mg total) by mouth at bedtime as needed for muscle spasms. 03/08/17   Rogue Bussing, MD  diazepam (VALIUM) 5 MG tablet Take 1 tablet (5 mg total) by mouth 2 (two) times daily. 10/03/17   Isla Pence, MD  diclofenac sodium (VOLTAREN) 1 % GEL Apply 2 g topically 4 (four) times daily as needed. 03/08/17   Rogue Bussing, MD  diphenhydrAMINE-zinc acetate (BENADRYL EXTRA STRENGTH) cream Apply 1  application topically 3 (three) times daily as needed for itching. 11/25/17   Damiah Mcdonald S, PA-C  EPINEPHrine 0.3 mg/0.3 mL IJ SOAJ injection INJECT 0.3ML IM ONCE 07/31/16   [provider]  fluticasone (FLONASE) 50 MCG/ACT nasal spray Place 2 sprays into both nostrils daily. 09/06/16   Valentina Shaggy, MD  hydrochlorothiazide (HYDRODIURIL) 25 MG tablet TAKE 1 TABLET BY MOUTH AT BEDTIME 11/02/16   Nicolette Bang, DO  hydrochlorothiazide (HYDRODIURIL) 25 MG tablet Take 1 tablet (25 mg total) by mouth at bedtime. 07/28/17    Benay Pike, MD  magic mouthwash w/lidocaine SOLN 5 ml swish and spit. Patient not taking: Reported on 09/13/2016 09/13/16   Nicolette Bang, DO  naproxen (NAPROSYN) 500 MG tablet Take 1 tablet (500 mg total) by mouth 2 (two) times daily with a meal. Patient taking differently: Take 500 mg by mouth 2 (two) times daily as needed for mild pain or moderate pain.  10/18/15   Mercy Riding, MD  oxyCODONE 10 MG TABS Take 0.5 tablets (5 mg total) by mouth every 4 (four) hours as needed for moderate pain. 09/16/16   Excell Seltzer, MD    Family History Family History  Problem Relation Age of Onset  . Stomach cancer Father   . Colon polyps Sister   . Diabetes Sister   . Colon cancer Neg Hx   . Esophageal cancer Neg Hx   . Gallbladder disease Neg Hx   . Rectal cancer Neg Hx     Social History Social History   Tobacco Use  . Smoking status: Never Smoker  . Smokeless tobacco: Never Used  Substance Use Topics  . Alcohol use: Yes    Alcohol/week: 0.0 standard drinks    Comment: Occassionally  . Drug use: No     Allergies   Morphine and Pollen extract   Review of Systems Review of Systems  Constitutional: Negative for chills and fever.  Musculoskeletal:       Right great toe pain  Skin: Positive for color change.     Physical Exam Updated Vital Signs BP (!) 156/104 (BP Location: Left Arm)   Pulse 81   Temp 98.9 F (37.2 C) (Oral)   Resp 16   Ht 5\' 5"  (1.651 m)   Wt 87.1 kg   LMP 08/20/2011   SpO2 98%   BMI 31.95 kg/m   Physical Exam  Constitutional: She appears well-developed and well-nourished. No distress.  HENT:  Head: Normocephalic and atraumatic.  Eyes: Conjunctivae are normal.  Neck: Neck supple.  Cardiovascular: Normal rate.  Pulmonary/Chest: Effort normal.  Musculoskeletal: Normal range of motion.  Neurological: She is alert.  Skin: Skin is warm and dry.  There is a very small less than 0.5 cm area of erythema and induration to the medial  aspect of the distal left great toe.  There is no fluctuance.  There is very minimal tenderness.  Full range of motion of the toe.  Neurovascularly intact.  Psychiatric: She has a normal mood and affect.  Nursing note and vitals reviewed.   ED Treatments / Results  Labs (all labs ordered are listed, but only abnormal results are displayed) Labs Reviewed - No data to display  EKG None  Radiology No results found.  Procedures Procedures (including critical care time)  Medications Ordered in ED Medications - No data to display   Initial Impression / Assessment and Plan / ED Course  I have reviewed the triage vital signs and the nursing  notes.  Pertinent labs & imaging results that were available during my care of the patient were reviewed by me and considered in my medical decision making (see chart for details).    Final Clinical Impressions(s) / ED Diagnoses   Final diagnoses:  Toe pain, right   Pt presented with right great toe pain, itching and burning that began after a reported bug bite last night. There is a very small less than 0.5 cm area of erythema and induration to the medial aspect of the distal left great toe.  There is no fluctuance.  There is very minimal tenderness.  There is no evidence of significant skin infection or abscess present. Not consistent with gout.  Feel that patient presentation most consistent with local inflammatory reaction from a possible bug bite.  She does not have diabetes and is not at high risk for developing an infection.  Will give Rx for Benadryl cleaning cream and have her follow-up closely with her PCP later this week.  Have advised her to monitor symptoms and if the redness progresses or her toe becomes more painful and she should return to the ER.  She voices understanding the plan agrees to return.  All questions answered.  ED Discharge Orders         Ordered    diphenhydrAMINE-zinc acetate (BENADRYL EXTRA STRENGTH) cream  3 times  daily PRN     11/25/17 1952           Rodney Booze, PA-C 11/25/17 1953    Virgel Manifold, MD 11/28/17 1016

## 2017-11-26 ENCOUNTER — Ambulatory Visit: Payer: Medicare Other

## 2017-12-21 ENCOUNTER — Encounter (HOSPITAL_COMMUNITY): Payer: Self-pay | Admitting: Emergency Medicine

## 2017-12-21 ENCOUNTER — Emergency Department (HOSPITAL_COMMUNITY)
Admission: EM | Admit: 2017-12-21 | Discharge: 2017-12-21 | Disposition: A | Discharge status: Home / Self Care | Payer: Medicare Other | Attending: Emergency Medicine | Admitting: Emergency Medicine

## 2017-12-21 ENCOUNTER — Other Ambulatory Visit: Payer: Self-pay

## 2017-12-21 DIAGNOSIS — Z79899 Other long term (current) drug therapy: Secondary | ICD-10-CM | POA: Diagnosis not present

## 2017-12-21 DIAGNOSIS — R0789 Other chest pain: Secondary | ICD-10-CM | POA: Diagnosis not present

## 2017-12-21 DIAGNOSIS — J45909 Unspecified asthma, uncomplicated: Secondary | ICD-10-CM | POA: Diagnosis not present

## 2017-12-21 DIAGNOSIS — I10 Essential (primary) hypertension: Secondary | ICD-10-CM | POA: Insufficient documentation

## 2017-12-21 MED ORDER — METHOCARBAMOL 500 MG PO TABS: 500.0000 mg | ORAL_TABLET | Freq: Two times a day (BID) | ORAL | 0 refills | Status: DC

## 2017-12-21 MED ORDER — DIAZEPAM 5 MG PO TABS
5.0000 mg | ORAL_TABLET | Freq: Once | ORAL | Status: DC
  Filled 2017-12-21: qty 1

## 2017-12-21 NOTE — ED Notes (Signed)
Patient was called to come to fast track. No response.

## 2017-12-21 NOTE — ED Triage Notes (Signed)
Patient here from home with complaints of left sided neck pain and back pain that started after lifting bags yesterday. Ambulatory.

## 2017-12-21 NOTE — ED Notes (Signed)
Bed: WTR6 Expected date:  Expected time:  Means of arrival:  Comments: 

## 2017-12-21 NOTE — ED Provider Notes (Signed)
[facility] DEPT Provider Note   CSN: [id number] Arrival date & time: 12/21/17  1024     History   Chief Complaint Chief Complaint  Patient presents with  . Neck Pain  . Back Pain    HPI Yvette Bell is a 62 y.o. female.  62 year old female presents with left-sided muscle strain that started after she was lifting heavy objects yesterday.  Describes as a cramp that starts in her left anterior chest and goes into her arm that is worse with movement and better with remaining still.  No associated dyspnea or diaphoresis.  No syncope or near syncope.  No palpitations.  Symptoms better with rest and no treatment used prior to arrival     Past Medical History:  Diagnosis Date  . Allergy   . Anxiety   . Arthritis   . Asthma   . Cataract    bil eyes  . Depression   . Fibroid tumor   . GERD (gastroesophageal reflux disease)   . Hyperlipidemia   . Hypertension   . Thyroid disease    hyperactive    Patient Active Problem List   Diagnosis Date Noted  . Acute right-sided low back pain 03/11/2017  . Lipoma of torso 03/11/2017  . Tremulousness 12/26/2016  . Acute cholecystitis 09/14/2016  . Seasonal allergic rhinitis due to pollen 09/06/2016  . Mild intermittent asthma, uncomplicated 73/53/2992  . Food allergy 07/31/2016  . Blackhead 07/31/2016  . Mass on back 10/25/2015  . Dysuria 09/14/2015  . UTI (urinary tract infection) 03/04/2015  . Skin sensitivity 01/22/2015  . Rectal bleeding 06/17/2014  . 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 Goiter 02/17/2014  . Heart murmur, systolic 42/68/3419  . NECK SPASM 02/02/2010  . ASCUS PAP 11/25/2009  . HYPOKALEMIA 03/30/2009  . OBESITY 01/19/2008  . HYPOTHYROIDISM, HX OF 07/05/2006  . UTERINE FIBROID 03/21/2006  . HYPERTENSION, BENIGN SYSTEMIC 03/21/2006    Past Surgical History:  Procedure Laterality Date  . cesearean section    . CHOLECYSTECTOMY N/A 09/14/2016   Procedure: LAPAROSCOPIC CHOLECYSTECTOMY WITH INTRAOPERATIVE CHOLANGIOGRAM;  Surgeon: Alphonsa Overall, MD;  Location: [facility];  Service: General;  Laterality: N/A;  . HERNIA REPAIR       OB History   None      Home Medications    Prior to Admission medications   Medication Sig Start Date End Date Taking? Authorizing Provider  amoxicillin-clavulanate (AUGMENTIN) 875-125 MG tablet Take 1 tablet by mouth 2 (two) times daily. 09/16/16   Excell Seltzer, MD  celecoxib (CELEBREX) 200 MG capsule Take 1 capsule (200 mg total) by mouth 2 (two) times daily. 10/03/17   Isla Pence, MD  cephALEXin (KEFLEX) 500 MG capsule Take 1 capsule (500 mg total) by mouth 2 (two) times daily. 12/18/16   Everrett Coombe, MD  cyclobenzaprine (FLEXERIL) 5 MG tablet Take 1 tablet (5 mg total) by mouth at bedtime as needed for muscle spasms. 03/08/17   Rogue Bussing, MD  diazepam (VALIUM) 5 MG tablet Take 1 tablet (5 mg total) by mouth 2 (two) times daily. 10/03/17   Isla Pence, MD  diclofenac sodium (VOLTAREN) 1 % GEL Apply 2 g topically 4 (four) times daily as needed. 03/08/17   Rogue Bussing, MD  diphenhydrAMINE-zinc acetate (BENADRYL EXTRA STRENGTH) cream Apply 1 application topically 3 (three) times daily as needed for itching. 11/25/17   Couture, Cortni S, PA-C  EPINEPHrine 0.3 mg/0.3 mL IJ SOAJ injection INJECT 0.3ML IM ONCE 07/31/16  [provider]  fluticasone (FLONASE) 50 MCG/ACT nasal spray Place 2 sprays into both nostrils daily. 09/06/16   Valentina Shaggy, MD  hydrochlorothiazide (HYDRODIURIL) 25 MG tablet TAKE 1 TABLET BY MOUTH AT BEDTIME 11/02/16   Nicolette Bang, DO  hydrochlorothiazide (HYDRODIURIL) 25 MG tablet Take 1 tablet (25 mg total) by mouth at bedtime. 07/28/17   Benay Pike, MD  magic mouthwash w/lidocaine SOLN 5 ml swish and spit. Patient not taking: Reported on 09/13/2016 09/13/16   Nicolette Bang, DO  naproxen (NAPROSYN) 500 MG  tablet Take 1 tablet (500 mg total) by mouth 2 (two) times daily with a meal. Patient taking differently: Take 500 mg by mouth 2 (two) times daily as needed for mild pain or moderate pain.  10/18/15   Mercy Riding, MD  oxyCODONE 10 MG TABS Take 0.5 tablets (5 mg total) by mouth every 4 (four) hours as needed for moderate pain. 09/16/16   Excell Seltzer, MD    Family History Family History  Problem Relation Age of Onset  . Stomach cancer Father   . Colon polyps Sister   . Diabetes Sister   . Colon cancer Neg Hx   . Esophageal cancer Neg Hx   . Gallbladder disease Neg Hx   . Rectal cancer Neg Hx     Social History Social History   Tobacco Use  . Smoking status: Never Smoker  . Smokeless tobacco: Never Used  Substance Use Topics  . Alcohol use: Yes    Alcohol/week: 0.0 standard drinks    Comment: Occassionally  . Drug use: No     Allergies   Morphine and Pollen extract   Review of Systems Review of Systems  All other systems reviewed and are negative.    Physical Exam Updated Vital Signs BP (!) 158/108 (BP Location: Right Arm)   Pulse 98   Temp 98 F (36.7 C) (Oral)   Resp 19   Ht 1.651 m (5\' 5" )   Wt 87 kg   LMP 08/20/2011   SpO2 100%   BMI 31.92 kg/m   Physical Exam  Constitutional: She is oriented to person, place, and time. She appears well-developed and well-nourished.  Non-toxic appearance. No distress.  HENT:  Head: Normocephalic and atraumatic.  Eyes: Pupils are equal, round, and reactive to light. Conjunctivae, EOM and lids are normal.  Neck: Normal range of motion. Neck supple. No tracheal deviation present. No thyroid mass present.  Cardiovascular: Normal rate, regular rhythm and normal heart sounds. Exam reveals no gallop.  No murmur heard. Pulmonary/Chest: Effort normal and breath sounds normal. No stridor. No respiratory distress. She has no decreased breath sounds. She has no wheezes. She has no rhonchi. She has no rales. She exhibits  tenderness. She exhibits no crepitus.    Abdominal: Soft. Normal appearance and bowel sounds are normal. She exhibits no distension. There is no tenderness. There is no rebound and no CVA tenderness.  Musculoskeletal: Normal range of motion. She exhibits no edema or tenderness.  Neurological: She is alert and oriented to person, place, and time. She has normal strength. No cranial nerve deficit or sensory deficit. GCS eye subscore is 4. GCS verbal subscore is 5. GCS motor subscore is 6.  Skin: Skin is warm and dry. No abrasion and no rash noted.  Psychiatric: She has a normal mood and affect. Her speech is normal and behavior is normal.  Nursing note and vitals reviewed.    ED Treatments / Results  Labs (  all labs ordered are listed, but only abnormal results are displayed) Labs Reviewed - No data to display  EKG None  Radiology No results found.  Procedures Procedures (including critical care time)  Medications Ordered in ED Medications  diazepam (VALIUM) tablet 5 mg (has no administration in time range)     Initial Impression / Assessment and Plan / ED Course  I have reviewed the triage vital signs and the nursing notes.  Pertinent labs & imaging results that were available during my care of the patient were reviewed by me and considered in my medical decision making (see chart for details).     Patient medicated for muscle strain with Valium and will be given Robaxin and return precautions  Final Clinical Impressions(s) / ED Diagnoses   Final diagnoses:  None    ED Discharge Orders    None       Lacretia Leigh, MD 12/21/17 1156

## 2018-02-14 ENCOUNTER — Other Ambulatory Visit: Payer: Self-pay | Admitting: Family Medicine

## 2018-02-14 DIAGNOSIS — Z1231 Encounter for screening mammogram for malignant neoplasm of breast: Secondary | ICD-10-CM

## 2018-02-27 ENCOUNTER — Encounter (HOSPITAL_COMMUNITY): Payer: Self-pay

## 2018-02-27 ENCOUNTER — Other Ambulatory Visit: Payer: Self-pay

## 2018-02-27 ENCOUNTER — Emergency Department (HOSPITAL_COMMUNITY): Payer: Medicare Other

## 2018-02-27 ENCOUNTER — Emergency Department (HOSPITAL_COMMUNITY)
Admission: EM | Admit: 2018-02-27 | Discharge: 2018-02-27 | Disposition: A | Discharge status: Home / Self Care | Payer: Medicare Other | Attending: Emergency Medicine | Admitting: Emergency Medicine

## 2018-02-27 DIAGNOSIS — Z79899 Other long term (current) drug therapy: Secondary | ICD-10-CM | POA: Diagnosis not present

## 2018-02-27 DIAGNOSIS — Z9049 Acquired absence of other specified parts of digestive tract: Secondary | ICD-10-CM | POA: Diagnosis not present

## 2018-02-27 DIAGNOSIS — J45909 Unspecified asthma, uncomplicated: Secondary | ICD-10-CM | POA: Insufficient documentation

## 2018-02-27 DIAGNOSIS — E039 Hypothyroidism, unspecified: Secondary | ICD-10-CM | POA: Insufficient documentation

## 2018-02-27 DIAGNOSIS — F329 Major depressive disorder, single episode, unspecified: Secondary | ICD-10-CM | POA: Insufficient documentation

## 2018-02-27 DIAGNOSIS — J069 Acute upper respiratory infection, unspecified: Secondary | ICD-10-CM | POA: Diagnosis not present

## 2018-02-27 DIAGNOSIS — B9789 Other viral agents as the cause of diseases classified elsewhere: Secondary | ICD-10-CM

## 2018-02-27 DIAGNOSIS — F419 Anxiety disorder, unspecified: Secondary | ICD-10-CM | POA: Insufficient documentation

## 2018-02-27 DIAGNOSIS — I10 Essential (primary) hypertension: Secondary | ICD-10-CM | POA: Diagnosis not present

## 2018-02-27 DIAGNOSIS — R05 Cough: Secondary | ICD-10-CM | POA: Diagnosis present

## 2018-02-27 MED ORDER — HYDROCOD POLST-CPM POLST ER 10-8 MG/5ML PO SUER: 5.0000 mL | Freq: Every evening | ORAL | 0 refills | Status: DC | PRN

## 2018-02-27 MED ORDER — ALBUTEROL SULFATE HFA 108 (90 BASE) MCG/ACT IN AERS: 1.0000 | INHALATION_SPRAY | Freq: Four times a day (QID) | RESPIRATORY_TRACT | 0 refills | Status: DC | PRN

## 2018-02-27 MED ORDER — ACETAMINOPHEN 325 MG PO TABS
650.0000 mg | ORAL_TABLET | Freq: Once | ORAL | Status: AC | PRN
  Administered 2018-02-27: 650 mg via ORAL
  Filled 2018-02-27: qty 2

## 2018-02-27 NOTE — ED Provider Notes (Signed)
[facility] DEPT Provider Note   CSN: [id number] Arrival date & time: 02/27/18  1928     History   Chief Complaint Chief Complaint  Patient presents with  . Cough    HPI Yvette Bell is a 63 y.o. female.  Patient to ED for evaluation of cough, sinus and nasal congestion, subjective fever for the past 3 days. No nausea, vomiting, diarrhea. No myalgias. Patient has a remote history of asthma but does not feel she is wheezing.   The history is provided by the patient. No language interpreter was used.  Cough  Associated symptoms: fever and headaches (Frontal)   Associated symptoms: no myalgias and no wheezing     Past Medical History:  Diagnosis Date  . Allergy   . Anxiety   . Arthritis   . Asthma   . Cataract    bil eyes  . Depression   . Fibroid tumor   . GERD (gastroesophageal reflux disease)   . Hyperlipidemia   . Hypertension   . Thyroid disease    hyperactive    Patient Active Problem List   Diagnosis Date Noted  . Acute right-sided low back pain 03/11/2017  . Lipoma of torso 03/11/2017  . Tremulousness 12/26/2016  . Acute cholecystitis 09/14/2016  . Seasonal allergic rhinitis due to pollen 09/06/2016  . Mild intermittent asthma, uncomplicated 94/85/4627  . Food allergy 07/31/2016  . Blackhead 07/31/2016  . Mass on back 10/25/2015  . Dysuria 09/14/2015  . UTI (urinary tract infection) 03/04/2015  . Skin sensitivity 01/22/2015  . Rectal bleeding 06/17/2014  . 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 Goiter 02/17/2014  . Heart murmur, systolic 03/50/0938  . NECK SPASM 02/02/2010  . ASCUS PAP 11/25/2009  . HYPOKALEMIA 03/30/2009  . OBESITY 01/19/2008  . HYPOTHYROIDISM, HX OF 07/05/2006  . UTERINE FIBROID 03/21/2006  . HYPERTENSION, BENIGN SYSTEMIC 03/21/2006    Past Surgical History:  Procedure Laterality Date  . cesearean section    . CHOLECYSTECTOMY N/A 09/14/2016   Procedure: LAPAROSCOPIC  CHOLECYSTECTOMY WITH INTRAOPERATIVE CHOLANGIOGRAM;  Surgeon: Alphonsa Overall, MD;  Location: [facility];  Service: General;  Laterality: N/A;  . HERNIA REPAIR       OB History   No obstetric history on file.      Home Medications    Prior to Admission medications   Medication Sig Start Date End Date Taking? Authorizing Provider  fluticasone (FLONASE) 50 MCG/ACT nasal spray Place 2 sprays into both nostrils daily. 09/06/16  Yes Valentina Shaggy, MD  hydrochlorothiazide (HYDRODIURIL) 25 MG tablet Take 1 tablet (25 mg total) by mouth at bedtime. 07/28/17  Yes Benay Pike, MD  amoxicillin-clavulanate (AUGMENTIN) 875-125 MG tablet Take 1 tablet by mouth 2 (two) times daily. Patient not taking: Reported on 02/27/2018 09/16/16   Excell Seltzer, MD  celecoxib (CELEBREX) 200 MG capsule Take 1 capsule (200 mg total) by mouth 2 (two) times daily. Patient not taking: Reported on 02/27/2018 10/03/17   Isla Pence, MD  cephALEXin (KEFLEX) 500 MG capsule Take 1 capsule (500 mg total) by mouth 2 (two) times daily. Patient not taking: Reported on 02/27/2018 12/18/16   Everrett Coombe, MD  cyclobenzaprine (FLEXERIL) 5 MG tablet Take 1 tablet (5 mg total) by mouth at bedtime as needed for muscle spasms. Patient not taking: Reported on 02/27/2018 03/08/17   Rogue Bussing, MD  diazepam (VALIUM) 5 MG tablet Take 1 tablet (5 mg total) by mouth 2 (two) times daily. Patient not taking:  Reported on 02/27/2018 10/03/17   Isla Pence, MD  diclofenac sodium (VOLTAREN) 1 % GEL Apply 2 g topically 4 (four) times daily as needed. Patient not taking: Reported on 02/27/2018 03/08/17   Rogue Bussing, MD  diphenhydrAMINE-zinc acetate (BENADRYL EXTRA STRENGTH) cream Apply 1 application topically 3 (three) times daily as needed for itching. Patient not taking: Reported on 02/27/2018 11/25/17   Couture, Cortni S, PA-C  EPINEPHrine 0.3 mg/0.3 mL IJ SOAJ injection Inject 0.3 mg into the muscle as needed for  anaphylaxis.  07/31/16   [provider]  hydrochlorothiazide (HYDRODIURIL) 25 MG tablet TAKE 1 TABLET BY MOUTH AT BEDTIME Patient not taking: Reported on 02/27/2018 11/02/16   Nicolette Bang, DO  magic mouthwash w/lidocaine SOLN 5 ml swish and spit. Patient not taking: Reported on 09/13/2016 09/13/16   Nicolette Bang, DO  methocarbamol (ROBAXIN) 500 MG tablet Take 1 tablet (500 mg total) by mouth 2 (two) times daily. Patient not taking: Reported on 02/27/2018 12/21/17   Lacretia Leigh, MD  naproxen (NAPROSYN) 500 MG tablet Take 1 tablet (500 mg total) by mouth 2 (two) times daily with a meal. Patient not taking: Reported on 02/27/2018 10/18/15   Mercy Riding, MD  oxyCODONE 10 MG TABS Take 0.5 tablets (5 mg total) by mouth every 4 (four) hours as needed for moderate pain. Patient not taking: Reported on 02/27/2018 09/16/16   Excell Seltzer, MD    Family History Family History  Problem Relation Age of Onset  . Stomach cancer Father   . Colon polyps Sister   . Diabetes Sister   . Colon cancer Neg Hx   . Esophageal cancer Neg Hx   . Gallbladder disease Neg Hx   . Rectal cancer Neg Hx     Social History Social History   Tobacco Use  . Smoking status: Never Smoker  . Smokeless tobacco: Never Used  Substance Use Topics  . Alcohol use: Yes    Alcohol/week: 0.0 standard drinks    Comment: Occassionally  . Drug use: No     Allergies   Morphine and Pollen extract   Review of Systems Review of Systems  Constitutional: Positive for fever. Negative for appetite change.  HENT: Positive for congestion and sinus pressure. Negative for trouble swallowing.   Respiratory: Positive for cough. Negative for wheezing.   Gastrointestinal: Negative for diarrhea, nausea and vomiting.  Musculoskeletal: Negative for myalgias.  Neurological: Positive for headaches (Frontal).     Physical Exam Updated Vital Signs BP 133/86 (BP Location: Right Arm)   Pulse 78   Temp  99.4 F (37.4 C) (Oral)   Resp 18   LMP 08/20/2011   SpO2 97%   Physical Exam Vitals signs and nursing note reviewed.  Constitutional:      Appearance: She is well-developed.  HENT:     Head: Normocephalic.     Nose: Nose normal.     Mouth/Throat:     Mouth: Mucous membranes are moist.     Pharynx: No oropharyngeal exudate.  Eyes:     Conjunctiva/sclera: Conjunctivae normal.  Neck:     Musculoskeletal: Normal range of motion and neck supple.  Cardiovascular:     Rate and Rhythm: Normal rate and regular rhythm.     Heart sounds: No murmur.  Pulmonary:     Effort: Pulmonary effort is normal.     Breath sounds: Wheezing (Minimal end expiratory wheeze on right upper field) present.  Abdominal:     General: Bowel sounds are  normal.     Palpations: Abdomen is soft.     Tenderness: There is no abdominal tenderness. There is no guarding or rebound.  Musculoskeletal: Normal range of motion.  Skin:    General: Skin is warm and dry.     Findings: No rash.  Neurological:     Mental Status: She is alert and oriented to person, place, and time.      ED Treatments / Results  Labs (all labs ordered are listed, but only abnormal results are displayed) Labs Reviewed - No data to display  EKG None  Radiology Dg Chest 2 View  Result Date: 02/27/2018 CLINICAL DATA:  Productive cough, headache for 3 days.  Fever. EXAM: CHEST - 2 VIEW COMPARISON:  Chest radiograph August 20, 2009 FINDINGS: Cardiomediastinal silhouette is normal. No pleural effusions or focal consolidations. Trachea projects midline and there is no pneumothorax. Soft tissue planes and included osseous structures are non-suspicious. Surgical clips in the included right abdomen compatible with cholecystectomy. IMPRESSION: No acute cardiopulmonary process. Electronically Signed   By: Elon Alas M.D.   On: 02/27/2018 20:00    Procedures Procedures (including critical care time)  Medications Ordered in ED Medications   acetaminophen (TYLENOL) tablet 650 mg (650 mg Oral Given 02/27/18 1941)     Initial Impression / Assessment and Plan / ED Course  I have reviewed the triage vital signs and the nursing notes.  Pertinent labs & imaging results that were available during my care of the patient were reviewed by me and considered in my medical decision making (see chart for details).     Patient to ED with URI symptoms, subjective fever.   Febrile to 102 in ED. No evidence sepsis. Exam is reassuring in very well appearing patient. Suspect viral process - CXR negative. Discussed supportive care including inhaler, Tussionex for nighttime use, OTC medications.   Final Clinical Impressions(s) / ED Diagnoses   Final diagnoses:  None   1. Febrile URI  ED Discharge Orders    None       Dennie Bible 02/27/18 2239    Davonna Belling, MD 02/27/18 [phone]

## 2018-02-27 NOTE — Discharge Instructions (Addendum)
Use Tussionex at night for cough. Use the inhaler during the day for cough as needed. Continue over the counter cold medications for additional relief. Follow up with your doctor as needed for persistent or worsening symptoms.

## 2018-02-27 NOTE — ED Triage Notes (Signed)
Pt BIB [facility] c/o dry cough and headache x 3 days. Also endorses congestion, fever at home, and chills. A&Ox4. Ambulatory.

## 2018-03-10 ENCOUNTER — Ambulatory Visit: Payer: Medicare Other

## 2018-03-12 ENCOUNTER — Other Ambulatory Visit (HOSPITAL_COMMUNITY)
Admission: RE | Admit: 2018-03-12 | Discharge: 2018-03-12 | Disposition: A | Discharge status: Home / Self Care | Payer: Medicare Other | Source: Ambulatory Visit | Attending: Family Medicine | Admitting: Family Medicine

## 2018-03-12 ENCOUNTER — Ambulatory Visit (INDEPENDENT_AMBULATORY_CARE_PROVIDER_SITE_OTHER): Payer: Medicare Other | Admitting: Family Medicine

## 2018-03-12 ENCOUNTER — Other Ambulatory Visit: Payer: Self-pay

## 2018-03-12 ENCOUNTER — Encounter: Payer: Self-pay | Admitting: Family Medicine

## 2018-03-12 VITALS — BP 120/60 | HR 78 | Temp 98.3°F | Wt 191.4 lb

## 2018-03-12 DIAGNOSIS — I10 Essential (primary) hypertension: Secondary | ICD-10-CM | POA: Diagnosis not present

## 2018-03-12 DIAGNOSIS — R7309 Other abnormal glucose: Secondary | ICD-10-CM | POA: Diagnosis not present

## 2018-03-12 DIAGNOSIS — R202 Paresthesia of skin: Secondary | ICD-10-CM

## 2018-03-12 DIAGNOSIS — Z0000 Encounter for general adult medical examination without abnormal findings: Secondary | ICD-10-CM

## 2018-03-12 DIAGNOSIS — Z780 Asymptomatic menopausal state: Secondary | ICD-10-CM | POA: Insufficient documentation

## 2018-03-12 LAB — POCT GLYCOSYLATED HEMOGLOBIN (HGB A1C): HEMOGLOBIN A1C: 5.7 % — AB (ref 4.0–5.6)

## 2018-03-12 MED ORDER — TETANUS-DIPHTH-ACELL PERTUSSIS 5-2.5-18.5 LF-MCG/0.5 IM SUSP: 0.5000 mL | Freq: Once | INTRAMUSCULAR | Status: DC

## 2018-03-12 NOTE — Progress Notes (Signed)
   [facility] Phone: [phone]   cc: physical  Subjective:   Left am tingling: patient describes it as going from her back down to her elbow and back around to her chest The tingling comes and goes. This has been going on for a week.  This happenes 3-4 times a day and lasts and lasts a minute.  No numbness or weakness of the arm. She Has been lifting heavy things while rearranging her house. She has had back problems since 2014. Takes flexeril.  Ibuprofen. She says she was prescribed valium for back problems and only takes them as needed. The last time was a month or two ago.   She has No chest pain or chest pressure and no episodes of diaphroresis.     HTN: takes HCTZ every day.  She is trying to lose weight.   Cuts back on eating and exercising more.    She Has a mammogram scheduled next month.  She Had a colonoscopy a few years ago that she states was normal.    ROS: See HPI for pertinent positives and negatives  Family history reviewed for today's visit. No changes.  Social history- patient is a non smoker.  Sometimes she drinks a glass maybe once a month.    Objective: BP 120/60   Pulse 78   Temp 98.3 F (36.8 C) (Oral)   Wt 191 lb 6 oz (86.8 kg)   LMP 08/20/2011   SpO2 99%   BMI 31.85 kg/m  Gen: NAD, alert and oriented, cooperative with exam HEENT: NCAT, EOMI, MMM Neck: no lymphadenopathy. No thyromegaly.   CV: normal rate, regular rhythm. No murmurs, no rubs.  Resp: LCTAB, no wheezes, crackles. normal work of breathing GI: nontender to palpation, BS present, no guarding or organomegaly Msk: No edema, warm, normal tone, moves UE/LE spontaneously Skin: acanthosis nigricans noted on nape of neck.  Psych: Appropriate behavior  Assessment/Plan: Paresthesia of left arm Likely from nerve impingement near the lower cervical, upper thoracic region.  It is unilateral and she is experiencing no numbness or weakness in that arm.  This has been happening  for a week and only lasts a few minutes a day.  Patient is already taking pain medication and muscle relaxants for her back pain.  Advised patient to make another appointment if the paraesthesia persists after a month or if she experiences numbness or weakness in that arm.    Healthcare maintenance Patient is trying to lose weight.  She has started exercising more and eating less.  She is obese with BMI 31.85. Pap is due.  - lipid panel - A1c - pap smear -Tdap ordered  Other abnormal glucose - A1c ordered  HYPERTENSION, BENIGN SYSTEMIC Blood pressure is 120/60 today.  Well controlled on HCTZ - continue HCTZ  Clemetine Marker, MD PGY-1

## 2018-03-12 NOTE — Patient Instructions (Signed)
It was great to see you today.    I will call you with the results of your Pap smear, your lipid panel, and your A1c when I get them. Your blood pressure looks great.  Keep taking your HCTZ.    Your arm pain sounds like a pinched nerve.  If it doesn't go away a month from now or it gets worse and you start to notice consistent numbness or weakness in your arm please make another appointment so that we can see if there's anything else we can do about it.    Have a great day.    Clemetine Marker, MD

## 2018-03-13 DIAGNOSIS — R7309 Other abnormal glucose: Secondary | ICD-10-CM | POA: Insufficient documentation

## 2018-03-13 DIAGNOSIS — Z0000 Encounter for general adult medical examination without abnormal findings: Secondary | ICD-10-CM | POA: Insufficient documentation

## 2018-03-13 DIAGNOSIS — R202 Paresthesia of skin: Secondary | ICD-10-CM | POA: Insufficient documentation

## 2018-03-13 HISTORY — DX: Encounter for general adult medical examination without abnormal findings: Z00.00

## 2018-03-13 LAB — LIPID PANEL
CHOL/HDL RATIO: 3.8 ratio (ref 0.0–4.4)
CHOLESTEROL TOTAL: 201 mg/dL — AB (ref 100–199)
HDL: 53 mg/dL (ref >39)
LDL CALC: 118 mg/dL — AB (ref 0–99)
TRIGLYCERIDES: 149 mg/dL (ref 0–149)
VLDL Cholesterol Cal: 30 mg/dL (ref 5–40)

## 2018-03-13 MED ORDER — TETANUS-DIPHTH-ACELL PERTUSSIS 5-2.5-18.5 LF-MCG/0.5 IM SUSP: 0.5000 mL | Freq: Once | INTRAMUSCULAR | 0 refills | Status: AC

## 2018-03-13 NOTE — Assessment & Plan Note (Signed)
Blood pressure is 120/60 today.  Well controlled on HCTZ - continue HCTZ

## 2018-03-13 NOTE — Assessment & Plan Note (Signed)
A1c ordered.

## 2018-03-13 NOTE — Assessment & Plan Note (Addendum)
Patient is trying to lose weight.  She has started exercising more and eating less.  She is obese with BMI 31.85. Pap is due.  - lipid panel - A1c - pap smear -Tdap ordered

## 2018-03-13 NOTE — Assessment & Plan Note (Signed)
Likely from nerve impingement near the lower cervical, upper thoracic region.  It is unilateral and she is experiencing no numbness or weakness in that arm.  This has been happening for a week and only lasts a few minutes a day.  Patient is already taking pain medication and muscle relaxants for her back pain.  Advised patient to make another appointment if the paraesthesia persists after a month or if she experiences numbness or weakness in that arm.

## 2018-03-14 LAB — CYTOLOGY - PAP
Adequacy: ABSENT
Diagnosis: NEGATIVE
HPV: NOT DETECTED

## 2018-03-18 ENCOUNTER — Encounter: Payer: Self-pay | Admitting: Family Medicine

## 2018-03-28 ENCOUNTER — Ambulatory Visit
Admission: RE | Admit: 2018-03-28 | Discharge: 2018-03-28 | Disposition: A | Discharge status: Home / Self Care | Payer: Medicare Other | Source: Ambulatory Visit | Attending: Family Medicine | Admitting: Family Medicine

## 2018-03-28 DIAGNOSIS — Z1231 Encounter for screening mammogram for malignant neoplasm of breast: Secondary | ICD-10-CM

## 2018-06-30 ENCOUNTER — Ambulatory Visit: Payer: Medicare Other

## 2018-08-22 ENCOUNTER — Other Ambulatory Visit: Payer: Self-pay | Admitting: Family Medicine

## 2018-08-22 ENCOUNTER — Telehealth: Payer: Self-pay | Admitting: *Deleted

## 2018-08-22 DIAGNOSIS — I10 Essential (primary) hypertension: Secondary | ICD-10-CM

## 2018-08-22 NOTE — Telephone Encounter (Signed)
-----   Message from Benay Pike, MD sent at 08/22/2018  2:50 PM EDT ----- Regarding: appt I'm refilling her HCTZ but can we make an appointment with her to check her kidney function as well as discuss her prediabetes.    Linna Hoff

## 2018-08-26 NOTE — Telephone Encounter (Signed)
Patient calling nurse line returning Yvette Bell call. Advised her she needs to make an apt to check kidney function for medication use. Patient declined at this time. I informed her she may not got additional refills.

## 2018-08-26 NOTE — Telephone Encounter (Signed)
LVM for pt to call back to try and schedule appointment per Dr. Jeannine Kitten, if she calls back please assist her in getting this scheduled.Yvette Bell, CMA

## 2019-03-06 ENCOUNTER — Other Ambulatory Visit: Payer: Self-pay | Admitting: Family Medicine

## 2019-03-06 DIAGNOSIS — Z1231 Encounter for screening mammogram for malignant neoplasm of breast: Secondary | ICD-10-CM

## 2019-03-24 ENCOUNTER — Other Ambulatory Visit: Payer: Self-pay

## 2019-03-24 ENCOUNTER — Encounter: Payer: Self-pay | Admitting: Family Medicine

## 2019-03-24 ENCOUNTER — Ambulatory Visit (INDEPENDENT_AMBULATORY_CARE_PROVIDER_SITE_OTHER): Payer: Medicare Other | Admitting: Family Medicine

## 2019-03-24 VITALS — BP 136/84 | HR 94 | Ht 65.0 in | Wt 179.6 lb

## 2019-03-24 DIAGNOSIS — E785 Hyperlipidemia, unspecified: Secondary | ICD-10-CM

## 2019-03-24 DIAGNOSIS — Z0000 Encounter for general adult medical examination without abnormal findings: Secondary | ICD-10-CM | POA: Diagnosis not present

## 2019-03-24 DIAGNOSIS — M791 Myalgia, unspecified site: Secondary | ICD-10-CM

## 2019-03-24 DIAGNOSIS — L2082 Flexural eczema: Secondary | ICD-10-CM | POA: Diagnosis not present

## 2019-03-24 DIAGNOSIS — I10 Essential (primary) hypertension: Secondary | ICD-10-CM

## 2019-03-24 MED ORDER — TRIAMCINOLONE ACETONIDE 0.1 % EX OINT: 1.0000 "application " | TOPICAL_OINTMENT | Freq: Two times a day (BID) | CUTANEOUS | 1 refills | Status: DC

## 2019-03-24 NOTE — Patient Instructions (Addendum)
I will get some labwork to further work up the cause of your lower body pain.  I will call you to discuss the results when I get them.    You can try putting something like an over the counter menthol ointment or lidocaine ointment to help with the pain.  I would also suggest getting a heating pad and applying it to the area.    For your eczema I would like you to apply this cream twice a day for two weeks.  You can also keep applying the creams you were already using.    I will let you think about whether you want to switch blood pressure medications and we can discuss this at the next visit.    I would like to see you back in 3 months.

## 2019-03-24 NOTE — Progress Notes (Signed)
    SUBJECTIVE:   CHIEF COMPLAINT / HPI:   Muscle aches: Has been experiencing muscle aches for the past few months.  Occurs mostly in the thighs and lower extremities.  His bilateral.  Did not experience any trauma or overuse prior to this.  No joint pain except for sometimes occurring in her hands.  Pain occurs "all the time" and is rated a 5/10.  Not taking any medication for it as the patient is now take occasions.  It is relieved by taking a hot shower.  No weakness, some tingling.  Family history: mom had rheumatoid arthritis  HTN: Patient just taking the hctz.  Does not like taking it though, because it makes her urinate.  Willing to think about switching to amlodipine.  Eczema: Patient states she has eczema on the back of her neck, which is pruritic.  She is puts homemade creams on it.  Does not use steroid creams.  Also has it slightly in the antecubital fossa, but not nearly as pruritic.  PERTINENT  PMH / PSH: HTN,  OBJECTIVE:   BP 136/84   Pulse 94   Ht 5\' 5"  (1.651 m)   Wt 179 lb 9.6 oz (81.5 kg)   LMP 08/20/2011   SpO2 98%   BMI 29.89 kg/m   General: Alert and oriented.  No acute distress. CV: Regular rate and rhythm, no murmurs. Pulmonary: Lungs clear to auscultation bilaterally, no wheezes, no crackles MSK: Strength 5/5 in upper and lower extremities bilaterally.  Normal gait.  No difficulty arising from chair.  Tenderness to palpation of the thighs and calves bilaterally. Skin: Thickened area of hyperpigmented skin on the back of the neck.  ASSESSMENT/PLAN:   Myalgia Lasting 1 to 2 months.  No joint pain, no weakness.  Mostly lower extremities.  Not taking any medications thought to be contributing to myositis or myalgia such as statins.  Uncertain of the cause at this point.  Differential includes polymyositis, drug interaction, rheumatoid arthritis, SLE, polymyalgia rheumatica. -CBC, CK, BMP, LFTs  HYPERTENSION, BENIGN SYSTEMIC Well-controlled on HCTZ 25 mg,  although patient does not enjoy the frequent urination she experiences with this.  Asked patient she would like to switch to an alternative treatment such as amlodipine.  Patient stated she would like to think about this.  Eczema Mostly localized to the back of the neck.  Thick, hypertrophic, hyperpigmented skin which patient states is pruritic.  Currently putting emollient on it but nothing else. -Triamcinolone 0.1% ointment for 2 weeks twice daily followed by once a week thereafter if improving symptoms -Continue emollients     Benay Pike, MD [place]

## 2019-03-25 DIAGNOSIS — M791 Myalgia, unspecified site: Secondary | ICD-10-CM | POA: Insufficient documentation

## 2019-03-25 DIAGNOSIS — L309 Dermatitis, unspecified: Secondary | ICD-10-CM | POA: Insufficient documentation

## 2019-03-25 LAB — CBC
Hematocrit: 43.4 % (ref 34.0–46.6)
Hemoglobin: 14.1 g/dL (ref 11.1–15.9)
MCH: 28.5 pg (ref 26.6–33.0)
MCHC: 32.5 g/dL (ref 31.5–35.7)
MCV: 88 fL (ref 79–97)
Platelets: 236 10*3/uL (ref 150–450)
RBC: 4.95 x10E6/uL (ref 3.77–5.28)
RDW: 13.2 % (ref 11.7–15.4)
WBC: 11.3 10*3/uL — ABNORMAL HIGH (ref 3.4–10.8)

## 2019-03-25 LAB — BASIC METABOLIC PANEL
BUN/Creatinine Ratio: 21 (ref 12–28)
BUN: 19 mg/dL (ref 8–27)
CO2: 23 mmol/L (ref 20–29)
Calcium: 10.2 mg/dL (ref 8.7–10.3)
Chloride: 103 mmol/L (ref 96–106)
Creatinine, Ser: 0.92 mg/dL (ref 0.57–1.00)
GFR calc Af Amer: 78 mL/min/{1.73_m2} (ref >59)
GFR calc non Af Amer: 67 mL/min/{1.73_m2} (ref >59)
Glucose: 105 mg/dL — ABNORMAL HIGH (ref 65–99)
Potassium: 3.9 mmol/L (ref 3.5–5.2)
Sodium: 146 mmol/L — ABNORMAL HIGH (ref 134–144)

## 2019-03-25 LAB — LIPID PANEL
Chol/HDL Ratio: 2.9 ratio (ref 0.0–4.4)
Cholesterol, Total: 188 mg/dL (ref 100–199)
HDL: 65 mg/dL (ref >39)
LDL Chol Calc (NIH): 111 mg/dL — ABNORMAL HIGH (ref 0–99)
Triglycerides: 67 mg/dL (ref 0–149)
VLDL Cholesterol Cal: 12 mg/dL (ref 5–40)

## 2019-03-25 LAB — HEPATIC FUNCTION PANEL
ALT: 14 IU/L (ref 0–32)
AST: 19 IU/L (ref 0–40)
Albumin: 4.3 g/dL (ref 3.8–4.8)
Alkaline Phosphatase: 99 IU/L (ref 39–117)
Bilirubin Total: 0.4 mg/dL (ref 0.0–1.2)
Bilirubin, Direct: 0.12 mg/dL (ref 0.00–0.40)
Total Protein: 7.8 g/dL (ref 6.0–8.5)

## 2019-03-25 LAB — HEPATITIS C ANTIBODY: Hep C Virus Ab: <0.1 s/co ratio (ref 0.0–0.9)

## 2019-03-25 LAB — CK: Total CK: 121 U/L (ref 32–182)

## 2019-03-25 NOTE — Assessment & Plan Note (Signed)
Well-controlled on HCTZ 25 mg, although patient does not enjoy the frequent urination she experiences with this.  Asked patient she would like to switch to an alternative treatment such as amlodipine.  Patient stated she would like to think about this.

## 2019-03-25 NOTE — Assessment & Plan Note (Signed)
Lasting 1 to 2 months.  No joint pain, no weakness.  Mostly lower extremities.  Not taking any medications thought to be contributing to myositis or myalgia such as statins.  Uncertain of the cause at this point.  Differential includes polymyositis, drug interaction, rheumatoid arthritis, SLE, polymyalgia rheumatica. -CBC, CK, BMP, LFTs

## 2019-03-25 NOTE — Assessment & Plan Note (Signed)
Mostly localized to the back of the neck.  Thick, hypertrophic, hyperpigmented skin which patient states is pruritic.  Currently putting emollient on it but nothing else. -Triamcinolone 0.1% ointment for 2 weeks twice daily followed by once a week thereafter if improving symptoms -Continue emollients

## 2019-03-27 ENCOUNTER — Telehealth: Payer: Self-pay | Admitting: Family Medicine

## 2019-03-27 NOTE — Telephone Encounter (Signed)
Called and left voicemail informing pt her lab work was normal for potential causes of her myalgia.  Informed her she would need another appointment for further workup and to call our office to set it up.

## 2019-04-15 ENCOUNTER — Ambulatory Visit: Payer: Medicare Other

## 2019-04-21 ENCOUNTER — Other Ambulatory Visit: Payer: Self-pay

## 2019-04-21 ENCOUNTER — Ambulatory Visit
Admission: RE | Admit: 2019-04-21 | Discharge: 2019-04-21 | Disposition: A | Discharge status: Home / Self Care | Payer: Medicare Other | Source: Ambulatory Visit | Attending: *Deleted | Admitting: *Deleted

## 2019-04-21 DIAGNOSIS — Z1231 Encounter for screening mammogram for malignant neoplasm of breast: Secondary | ICD-10-CM

## 2019-09-23 ENCOUNTER — Ambulatory Visit: Payer: Medicare Other | Admitting: Family Medicine

## 2019-09-23 NOTE — Progress Notes (Deleted)
    SUBJECTIVE:   CHIEF COMPLAINT / HPI:   COVID:   Myalgia: normal labs.    PERTINENT  PMH / PSH: ***  OBJECTIVE:   LMP 08/20/2011   ***  ASSESSMENT/PLAN:   No problem-specific Assessment & Plan notes found for this encounter.     Benay Pike, MD [place]

## 2019-09-29 ENCOUNTER — Ambulatory Visit: Payer: Medicare Other

## 2019-09-30 NOTE — Progress Notes (Signed)
° ° °  SUBJECTIVE:   CHIEF COMPLAINT / HPI:   Discoloration of toe nails: Patient reports discoloration of one toenail and one fingernail that started "out of the blue" a few weeks ago.  She denies any trauma to the nails.  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  No concerns for fevers, body aches, weight loss, night sweats.  Urinary frequency   abdominal pain: Patient reports about 1 week of nondescript abdominal pain that is more significant in the suprapubic region, reports associated urinary frequency.  She denies any pain with urination.  Also reports some vaginal discharge, denies itchiness, burning sensation, denies fishy odor.  Would like to have her urine tested today to rule out urinary tract infection.   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   Urate crystals present on microscopy 10/03/2019   Hyperglycemia 10/01/2019   Urinary frequency 10/01/2019   Myalgia 03/25/2019   Eczema 03/25/2019   Paresthesia of left arm 03/13/2018   Other abnormal glucose 03/13/2018   Acute right-sided low back pain 03/11/2017   Seasonal allergic rhinitis due to pollen 09/06/2016   Mild intermittent asthma, uncomplicated 66/06/3014   Food allergy 07/31/2016   Dysuria 09/14/2015   UTI (urinary tract infection) 03/04/2015   Goiter 02/17/2014   Heart murmur, systolic 01/30/3233   OBESITY 01/19/2008   HYPOTHYROIDISM, HX OF 07/05/2006   HYPERTENSION, BENIGN SYSTEMIC 03/21/2006    OBJECTIVE:   BP 127/80    Pulse 81    Ht 5\' 5"  (1.651 m)    Wt 190 lb 12.8 oz (86.5 kg)    LMP 08/20/2011    SpO2 98%    BMI 31.75 kg/m    Physical exam: General: Well-appearing, no apparent distress Respiratory: CTA bilaterally, comfortable work of breathing Cardio: RRR, S1-S2 present,  no murmurs Abdomen: Normal bowel sounds, no CVA tenderness Extremities: Light brown streaks measuring about 2 mm in width appreciated to fingernail, most likely consistent with longitudinal melanonychia  ASSESSMENT/PLAN:   Encounter for immunization -Patient received Covid vaccination 1 of 2 today  Hyperglycemia -Due to concern for urinary frequency would like to rule out diabetes as potential cause, HbA1c within normal limits  Longitudinal melanonychia Patient with recent onset of light brown streaks in a fingernail and one toenail.  Most consistent with longitudinal melanonychia.   -Patient encouraged to continue to monitor for changes -can consider dermatology referral as needed  Urate crystals present on microscopy Patient presented to clinic with urinary frequency (on HCTZ), urinary frequency, and lower abdominal pain.  Urinalysis was performed to rule out urinary tract infection.  UA negative for UTI, however showed urate crystals on microscopy. -Consider changing HCTZ -Reduce meat intake -Continue to monitor for symptoms; can consider serum urate level, uric acid-2-creatinine ratio, and repeat BMP    Addendum re Urinalysis: patient's UA and reflex microscopy showed Uric Acid Crystals. Patient without any history of renal disease. She is currently taking HCTZ. I called the Nephrologist on call Dr. Jonnie Finner to discuss patient's lab findings and clinic picture. He feels the crystals are most likely due to taking HCTZ and recommends patient consuming less meat. Does not feel further work up with Serum urate, Uric acid-to-Creatinine ratio or repeat BMP are necessary.    [place]

## 2019-09-30 NOTE — Patient Instructions (Addendum)
Thank you for coming in to see Yvette Bell today! Please see below to review our plan for today's visit:  1. I will call you with results and need to treat for a UTI.  2. You have Longitudinal melanonychia of your nails. Please monitor for changes.  3. Congrats on your COVID vaccine!  Please call the clinic at [phone] if your symptoms worsen or you have any concerns. It was our pleasure to serve you!   Dr. Milus Banister [facility]

## 2019-10-01 ENCOUNTER — Other Ambulatory Visit: Payer: Self-pay

## 2019-10-01 ENCOUNTER — Ambulatory Visit (INDEPENDENT_AMBULATORY_CARE_PROVIDER_SITE_OTHER): Payer: Medicare Other | Admitting: Family Medicine

## 2019-10-01 VITALS — BP 127/80 | HR 81 | Ht 65.0 in | Wt 190.8 lb

## 2019-10-01 DIAGNOSIS — L608 Other nail disorders: Secondary | ICD-10-CM

## 2019-10-01 DIAGNOSIS — R350 Frequency of micturition: Secondary | ICD-10-CM

## 2019-10-01 DIAGNOSIS — Z23 Encounter for immunization: Secondary | ICD-10-CM

## 2019-10-01 DIAGNOSIS — R739 Hyperglycemia, unspecified: Secondary | ICD-10-CM | POA: Diagnosis not present

## 2019-10-01 DIAGNOSIS — R895 Abnormal microbiological findings in specimens from other organs, systems and tissues: Secondary | ICD-10-CM

## 2019-10-01 LAB — POCT GLYCOSYLATED HEMOGLOBIN (HGB A1C): Hemoglobin A1C: 5.2 % (ref 4.0–5.6)

## 2019-10-03 DIAGNOSIS — Z23 Encounter for immunization: Secondary | ICD-10-CM | POA: Insufficient documentation

## 2019-10-03 DIAGNOSIS — R895 Abnormal microbiological findings in specimens from other organs, systems and tissues: Secondary | ICD-10-CM | POA: Insufficient documentation

## 2019-10-03 DIAGNOSIS — L608 Other nail disorders: Secondary | ICD-10-CM | POA: Insufficient documentation

## 2019-10-03 LAB — URINALYSIS, ROUTINE W REFLEX MICROSCOPIC
Bilirubin, UA: NEGATIVE
Glucose, UA: NEGATIVE
Ketones, UA: NEGATIVE
Leukocytes,UA: NEGATIVE
Nitrite, UA: NEGATIVE
Protein,UA: NEGATIVE
Specific Gravity, UA: 1.027 (ref 1.005–1.030)
Urobilinogen, Ur: 0.2 mg/dL (ref 0.2–1.0)
pH, UA: 5 (ref 5.0–7.5)

## 2019-10-03 LAB — MICROSCOPIC EXAMINATION
Bacteria, UA: NONE SEEN
Casts: NONE SEEN /lpf
WBC, UA: NONE SEEN /hpf (ref 0–5)

## 2019-10-03 NOTE — Assessment & Plan Note (Signed)
-  Patient received Covid vaccination 1 of 2 today

## 2019-10-03 NOTE — Assessment & Plan Note (Signed)
Patient presented to clinic with urinary frequency (on HCTZ), urinary frequency, and lower abdominal pain.  Urinalysis was performed to rule out urinary tract infection.  UA negative for UTI, however showed urate crystals on microscopy. -Consider changing HCTZ -Reduce meat intake -Continue to monitor for symptoms; can consider serum urate level, uric acid-2-creatinine ratio, and repeat BMP

## 2019-10-03 NOTE — Assessment & Plan Note (Signed)
-  Due to concern for urinary frequency would like to rule out diabetes as potential cause, HbA1c within normal limits

## 2019-10-03 NOTE — Assessment & Plan Note (Signed)
Patient with recent onset of light brown streaks in a fingernail and one toenail.  Most consistent with longitudinal melanonychia.   -Patient encouraged to continue to monitor for changes -can consider dermatology referral as needed

## 2019-10-22 ENCOUNTER — Other Ambulatory Visit: Payer: Self-pay

## 2019-10-22 ENCOUNTER — Other Ambulatory Visit: Payer: Self-pay | Admitting: Family Medicine

## 2019-10-22 ENCOUNTER — Ambulatory Visit (INDEPENDENT_AMBULATORY_CARE_PROVIDER_SITE_OTHER): Payer: Medicare Other

## 2019-10-22 DIAGNOSIS — Z23 Encounter for immunization: Secondary | ICD-10-CM

## 2019-10-22 DIAGNOSIS — I10 Essential (primary) hypertension: Secondary | ICD-10-CM

## 2019-10-22 NOTE — Progress Notes (Signed)
   Covid-19 Vaccination Clinic  Name:  Yvette Bell    MRN: [id number] DOB: [birth year]  10/22/2019   Patient presents to nurse clinic for second [place] vaccination. Patient reports elevated HR after receiving first dose. Patient counseled by Dr. Owens Shark. Per Dr. Owens Shark, patient may receive vaccination, however, will need to wait 30 minutes post injection. Administered in RD, site unremarkable, tolerated injection well.   Yvette Bell was observed post Covid-19 immunization for 30 minutes based on pre-vaccination screening without incident. She was provided with Vaccine Information Sheet and instruction to access the V-Safe system.   Yvette Bell was instructed to call 911 with any severe reactions post vaccine: Marland Kitchen Difficulty breathing  . Swelling of face and throat  . A fast heartbeat  . A bad rash all over body  . Dizziness and weakness    Provided patient with updated immunization record and card.   Talbot Grumbling, RN

## 2019-10-27 ENCOUNTER — Other Ambulatory Visit: Payer: Self-pay | Admitting: *Deleted

## 2019-10-27 DIAGNOSIS — I10 Essential (primary) hypertension: Secondary | ICD-10-CM

## 2019-10-27 MED ORDER — HYDROCHLOROTHIAZIDE 25 MG PO TABS: 25.0000 mg | ORAL_TABLET | Freq: Every day | ORAL | 3 refills | Status: DC

## 2019-10-27 NOTE — Telephone Encounter (Signed)
Looks like script from 10/24/19 failed.  Received additional request from pharmacy.  Resent same as previous script from 10/24/19. Christen Bame, CMA

## 2020-01-12 ENCOUNTER — Encounter (HOSPITAL_COMMUNITY): Payer: Self-pay | Admitting: Emergency Medicine

## 2020-01-12 ENCOUNTER — Emergency Department (HOSPITAL_COMMUNITY): Payer: Medicare Other

## 2020-01-12 ENCOUNTER — Emergency Department (HOSPITAL_COMMUNITY)
Admission: EM | Admit: 2020-01-12 | Discharge: 2020-01-12 | Disposition: A | Discharge status: Home / Self Care | Payer: Medicare Other | Attending: Emergency Medicine | Admitting: Emergency Medicine

## 2020-01-12 DIAGNOSIS — R0789 Other chest pain: Secondary | ICD-10-CM

## 2020-01-12 DIAGNOSIS — I10 Essential (primary) hypertension: Secondary | ICD-10-CM | POA: Diagnosis not present

## 2020-01-12 DIAGNOSIS — Z7951 Long term (current) use of inhaled steroids: Secondary | ICD-10-CM | POA: Diagnosis not present

## 2020-01-12 DIAGNOSIS — J4520 Mild intermittent asthma, uncomplicated: Secondary | ICD-10-CM | POA: Diagnosis not present

## 2020-01-12 LAB — BASIC METABOLIC PANEL
Anion gap: 11 (ref 5–15)
BUN: 10 mg/dL (ref 8–23)
CO2: 25 mmol/L (ref 22–32)
Calcium: 9.9 mg/dL (ref 8.9–10.3)
Chloride: 105 mmol/L (ref 98–111)
Creatinine, Ser: 0.7 mg/dL (ref 0.44–1.00)
GFR, Estimated: >60 mL/min (ref >60)
Glucose, Bld: 105 mg/dL — ABNORMAL HIGH (ref 70–99)
Potassium: 3.4 mmol/L — ABNORMAL LOW (ref 3.5–5.1)
Sodium: 141 mmol/L (ref 135–145)

## 2020-01-12 LAB — CBC
HCT: 46.3 % — ABNORMAL HIGH (ref 36.0–46.0)
Hemoglobin: 14.5 g/dL (ref 12.0–15.0)
MCH: 28.5 pg (ref 26.0–34.0)
MCHC: 31.3 g/dL (ref 30.0–36.0)
MCV: 91 fL (ref 80.0–100.0)
Platelets: 253 10*3/uL (ref 150–400)
RBC: 5.09 MIL/uL (ref 3.87–5.11)
RDW: 14.1 % (ref 11.5–15.5)
WBC: 12.6 10*3/uL — ABNORMAL HIGH (ref 4.0–10.5)
nRBC: 0 % (ref 0.0–0.2)

## 2020-01-12 LAB — TROPONIN I (HIGH SENSITIVITY)
Troponin I (High Sensitivity): 3 ng/L (ref <18)
Troponin I (High Sensitivity): 3 ng/L (ref <18)

## 2020-01-12 NOTE — ED Triage Notes (Signed)
Pt arrives via [facility] from home, pt reports she began having chest pain yesterday after lifting some bags of clothes. Denies n/v or sob, received 324mg  and 1 sl nitro.  175/95, down to 129/90 after nitro, 12 lead unremarkable. 20G IV RH. Hx of htn and has not taken her bp meds x1 month. A/ox4, resp e/u, nad.

## 2020-01-12 NOTE — ED Notes (Signed)
Lab called w/ corrected trop for the collected trop at 1224 and said it was supposed to be 3 instead of 7

## 2020-01-12 NOTE — Discharge Instructions (Addendum)
You have been seen and discharged from the emergency department. You make take over the counter medication for chest discomfort. Follow-up with your primary provider for reevaluation, cardiac referral and testing. Take new prescriptions and home medications as prescribed. If you have any worsening symptoms, worsening chest pain, difficulty breathing or further concerns or health please return to emergency department for further evaluation.

## 2020-01-12 NOTE — ED Provider Notes (Signed)
[facility] EMERGENC[facility] DEPARTMENT Provider Note   CSN: [id number] Arrival date & time: 01/12/20  1038     History Chief Complaint  Patient presents with  . Chest Pain    Yvette Bell is a 64 y.o. female.  HPI   64 year old female with past medical history of HTN, HLD, cholecystectomy presents to the emergency department with left-sided chest pain.  Patient states right now she is managing her blood pressure and cholesterol with natural remedies.  She does follow with her primary doctor.  She states yesterday when she was active and doing a lot of lifting she had a sudden left-sided sharp chest pain.  This resolved when she was not moving or lifting but was reproduced with further movement of her upper extremities and chest.  Patient has had this happen before couple years ago, she states that she pulled her chest wall muscle.  She denies any shortness of breath, cough, palpitations/racing heartbeat, GI symptoms, swelling of her lower extremities.  She has otherwise been in her usual state of health.  Past Medical History:  Diagnosis Date  . Acute cholecystitis 09/14/2016  . Allergy   . Anxiety   . Arthritis   . ASCUS PAP 11/25/2009   Qualifier: Diagnosis of  By: [name], [name]    . Asthma   . [place] 07/31/2016  . Cataract    bil eyes  . Depression   . Fibroid tumor   . GERD (gastroesophageal reflux disease)   . Healthcare maintenance 03/13/2018  . Hyperlipidemia   . Hypertension   . HYPOKALEMIA 03/30/2009   Qualifier: History of  By: [name], Cindee Salt    . 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 Lipoma of torso 03/11/2017  . Mass on back 10/25/2015  . NECK SPASM 02/02/2010   Qualifier: Diagnosis of  By: [name]  MD, Hosie Poisson    . Rectal bleeding 06/17/2014  . Skin sensitivity 01/22/2015  . Thyroid disease    hyperactive  . Tremulousness 12/26/2016  . UTERINE FIBROID 03/21/2006   Qualifier: Diagnosis of  By: [name], [name]      Patient Active  Problem List   Diagnosis Date Noted  . Encounter for immunization 10/03/2019  . 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 Hyperglycemia 10/01/2019  . Urinary frequency 10/01/2019  . Myalgia 03/25/2019  . Eczema 03/25/2019  . Paresthesia of left arm 03/13/2018  . Other abnormal glucose 03/13/2018  . Acute right-sided low back pain 03/11/2017  . Seasonal allergic rhinitis due to pollen 09/06/2016  . Mild intermittent asthma, uncomplicated 0000000  . Food allergy 07/31/2016  . Dysuria 09/14/2015  . UTI (urinary tract infection) 03/04/2015  . Goiter 02/17/2014  . Heart murmur, systolic 0000000  . OBESITY 01/19/2008  . HYPOTHYROIDISM, HX OF 07/05/2006  . HYPERTENSION, BENIGN SYSTEMIC 03/21/2006    Past Surgical History:  Procedure Laterality Date  . cesearean section    . CHOLECYSTECTOMY N/A 09/14/2016   Procedure: LAPAROSCOPIC CHOLECYSTECTOMY WITH INTRAOPERATIVE CHOLANGIOGRAM;  Surgeon: Alphonsa Overall, MD;  Location: [facility];  Service: General;  Laterality: N/A;  . HERNIA REPAIR       OB History   No obstetric history on file.     Family History  Problem Relation Age of Onset  . Stomach cancer Father   . Colon polyps Sister   . Diabetes Sister   . Colon cancer Neg Hx   . Esophageal cancer Neg Hx   . Gallbladder disease Neg  Hx   . Rectal cancer Neg Hx     Social History   Tobacco Use  . Smoking status: Never Smoker  . Smokeless tobacco: Never Used  Vaping Use  . Vaping Use: Never used  Substance Use Topics  . Alcohol use: Yes    Alcohol/week: 0.0 standard drinks    Comment: Occassionally  . Drug use: No    Home Medications Prior to Admission medications   Medication Sig Start Date End Date Taking? Authorizing Provider  albuterol (PROVENTIL HFA;VENTOLIN HFA) 108 (90 Base) MCG/ACT inhaler Inhale 1-2 puffs into the lungs every 6 (six) hours as needed for wheezing or shortness of breath. 02/27/18   Charlann Lange, PA-C  chlorpheniramine-HYDROcodone (TUSSIONEX PENNKINETIC ER) 10-8 MG/5ML SUER Take 5 mLs by mouth at bedtime as needed for cough. 02/27/18   Charlann Lange, PA-C  EPINEPHrine 0.3 mg/0.3 mL IJ SOAJ injection Inject 0.3 mg into the muscle as needed for anaphylaxis.  07/31/16   [provider]  fluticasone (FLONASE) 50 MCG/ACT nasal spray Place 2 sprays into both nostrils daily. 09/06/16   Valentina Shaggy, MD  hydrochlorothiazide (HYDRODIURIL) 25 MG tablet Take 1 tablet (25 mg total) by mouth at bedtime. 10/27/19   Benay Pike, MD  triamcinolone ointment (KENALOG) 0.1 % Apply 1 application topically 2 (two) times daily. For 2 weeks. 03/24/19   Benay Pike, MD    Allergies    Morphine and Pollen extract  Review of Systems   Review of Systems  Constitutional: Negative for chills and fever.  HENT: Negative for congestion.   Eyes: Negative for visual disturbance.  Respiratory: Negative for chest tightness and shortness of breath.   Cardiovascular: Positive for chest pain. Negative for palpitations and leg swelling.  Gastrointestinal: Negative for abdominal pain, diarrhea and vomiting.  Genitourinary: Negative for dysuria.  Musculoskeletal: Negative for back pain.  Skin: Negative for rash.  Neurological: Negative for headaches.    Physical Exam Updated Vital Signs BP (!) 142/85   Pulse 77   Temp 98.4 F (36.9 C) (Oral)   Resp 11   Ht 5\' 5"  (1.651 m)   Wt 81.6 kg   LMP 08/20/2011   SpO2 100%   BMI 29.95 kg/m   Physical Exam Vitals and nursing note reviewed.  Constitutional:      Appearance: Normal appearance.  HENT:     Head: Normocephalic.     Mouth/Throat:     Mouth: Mucous membranes are moist.  Cardiovascular:     Rate and Rhythm: Normal rate.  Pulmonary:     Effort: Pulmonary effort is normal. No tachypnea, accessory muscle usage or respiratory distress.     Breath sounds: Normal breath sounds.  Chest:     Chest wall: Tenderness present. No  deformity, crepitus or edema.     Comments: Tenderness to to palpation of the left costochondral area, very reproducible, no overlying skin changes or rash Abdominal:     Palpations: Abdomen is soft.     Tenderness: There is no abdominal tenderness.  Musculoskeletal:     Right lower leg: No edema.     Left lower leg: No edema.  Skin:    General: Skin is warm.  Neurological:     Mental Status: She is alert and oriented to person, place, and time. Mental status is at baseline.  Psychiatric:        Mood and Affect: Mood normal.     ED Results / Procedures / Treatments   Labs (all labs  ordered are listed, but only abnormal results are displayed) Labs Reviewed  BASIC METABOLIC PANEL - Abnormal; Notable for the following components:      Result Value   Potassium 3.4 (*)    Glucose, Bld 105 (*)    All other components within normal limits  CBC - Abnormal; Notable for the following components:   WBC 12.6 (*)    HCT 46.3 (*)    All other components within normal limits  TROPONIN I (HIGH SENSITIVITY)  TROPONIN I (HIGH SENSITIVITY)    EKG EKG Interpretation  Date/Time:  Tuesday January 12 2020 10:44:59 EST Ventricular Rate:  84 PR Interval:  158 QRS Duration: 66 QT Interval:  358 QTC Calculation: 423 R Axis:   72 Text Interpretation: Normal sinus rhythm Nonspecific ST and T wave abnormality Abnormal ECG No significant change since last tracing Confirmed by Dorie Rank [phone]) on 01/12/2020 2:35:10 PM   Radiology DG Chest 2 View  Result Date: 01/12/2020 CLINICAL DATA:  Chest pain EXAM: CHEST - 2 VIEW COMPARISON:  02/27/2018 and prior. FINDINGS: No focal consolidation. No pneumothorax or pleural effusion. Cardiomediastinal silhouette is within normal limits. No acute osseous abnormality. IMPRESSION: No focal airspace disease. Electronically Signed   By: Primitivo Gauze M.D.   On: 01/12/2020 11:14    Procedures Procedures (including critical care time)  Medications  Ordered in ED Medications - No data to display  ED Course  I have reviewed the triage vital signs and the nursing notes.  Pertinent labs & imaging results that were available during my care of the patient were reviewed by me and considered in my medical decision making (see chart for details).  Clinical Course as of 01/12/20 1525  Tue Jan 12, 2020  1524 EKG shows normal sinus rhythm, she has nonspecific T wave changes in 1, aVL but no other acute ischemic changes.  Blood work is reassuring, troponin is negative x2.  Chest x-ray shows no acute finding. [KH]    Clinical Course User Index [KH] Arthelia Callicott, Alvin Critchley, DO   MDM Rules/Calculators/A&P                         64 year old female presents the emergency department with left-sided chest wall pain that started acutely while doing heavy lifting.  It is very reproducible on exam with palpation to the left costochondral area.  EKG does not show any acute ischemic changes, it is comparable to previous.  Chest x-ray shows no acute finding, troponins are negative.  She is a low heart score and I have low solution for ACS and vascular problem given the musculoskeletal and reproducible component.  No suspicion for pulmonary embolism at this time as she has no shortness of breath, tachycardia or respiratory complaint.  I instructed her to follow-up with her primary doctor for cardiac referral and testing as needed.  Patient will be discharged and treated as an outpatient.  Discharge plan discussed, patient verbalizes understanding and agreement.   final Clinical Impression(s) / ED Diagnoses Final diagnoses:  Chest wall pain    Rx / DC Orders ED Discharge Orders    None       Lorelle Gibbs, DO 01/12/20 1526

## 2020-02-14 NOTE — Progress Notes (Signed)
   Subjective:   Patient ID: Yvette Bell    DOB: 04/11/1955, 65 y.o. female   MRN: [id number]  Yvette Bell is a 65 y.o. female with a history of HTN, mild intermittent asthma, seasonal allergic rhinitis, goiter, eczema, longitudinal melanonychia, systolic heart murmur, hyperglycemia, hypothyroidism, myalgia, obesity, paresthesia of left arm, h/o urate crystals here for lump on arm and vaginal irritation  Lump on Wrist Patient notes small nodule on wrist that she noticed about 1.5 weeks ago. Denies any pain, difficulty with ROM, or decreased strength. Denies any trauma.   Malodorous Vaginal Discharge: Patient endorses malodorous vaginal discharge with change in vaginal discharge color to brownish color. Denies any recent antibiotics but does note frequent changes in soaps. She is postmenopausal and LMP was many years ago. She has not been sexually active in at least 3 years. Denies STD testing.   Urinary Frequency: Endorses urinary frequency but denies urgency, dysuria, hematuria. Does note that she drinks a lot of water. [name] like to make sure she doesn't have a UTI. Denies fevers or flank pain.  Review of Systems:  Per HPI.   Objective:   BP (!) 142/80   Pulse (!) 105   Ht 5\' 5"  (1.651 m)   Wt 190 lb (86.2 kg)   LMP 08/20/2011   SpO2 97%   BMI 31.62 kg/m  Vitals and nursing note reviewed.  General: pleasant older female, sitting comfortably on exam bed, well nourished, well developed, in no acute distress with non-toxic appearance Resp: breathing comfortably on room air, speaking in full sentences Abdomen: no suprapubic tenderness Skin: warm, dry Extremities/MSK: left wrist with small BB-sized hard nodule on lateral anterior wrist only appreciated when wrist is extended, nontender to palpation, no overlying skin changes, normal wrist ROM and strength Neuro: Alert and oriented, speech normal Pelvic exam: VULVA: normal appearing vulva with no masses, tenderness or lesions, VAGINA:  atrophic, CERVIX: normal appearing cervix without lesion, white thickish creamy discharge, NO tenderness to bladder upon internal palpation Chaperone: [place]:   Vaginal discharge Acute. Not sexually active. Post-menopausal. No abnormal findings on pelvic exam.  - Unable to do wet prep in office due to staffing issues. Follow up send out BV, GC/Cl, trich, yeast - Treat if indicated   Urinary frequency Acute. No other UTI symptoms. UA unremarkable, however macroscopic RBC present - possibly from preceding pelvic exam which noted vaginal atrophy - follow up urine culture - recommend repeat UA in 2-4 weeks to ensure resolution of macroscopic hematuria    Lump of left wrist Acute. Unclear etiology. Nontender and does not affect wrist function or strength. Suspect ganglion cyst. Recommended monitoring. If enlarges or becomes symptomatic, recommend follow up. Can consider ultrasound vs x-ray at that time. Patient voiced understanding and agreement with plan.   Orders Placed This Encounter  Procedures  . Urine Culture  . POCT urinalysis dipstick   No orders of the defined types were placed in this encounter.   Mina Marble, DO PGY-3, [facility] 02/15/2020 7:19 PM

## 2020-02-15 ENCOUNTER — Other Ambulatory Visit (HOSPITAL_COMMUNITY)
Admission: RE | Admit: 2020-02-15 | Discharge: 2020-02-15 | Disposition: A | Discharge status: Home / Self Care | Payer: Medicare Other | Source: Ambulatory Visit | Attending: Family Medicine | Admitting: Family Medicine

## 2020-02-15 ENCOUNTER — Other Ambulatory Visit: Payer: Self-pay

## 2020-02-15 ENCOUNTER — Ambulatory Visit (INDEPENDENT_AMBULATORY_CARE_PROVIDER_SITE_OTHER): Payer: Medicare Other | Admitting: Family Medicine

## 2020-02-15 VITALS — BP 142/80 | HR 105 | Ht 65.0 in | Wt 190.0 lb

## 2020-02-15 DIAGNOSIS — R2232 Localized swelling, mass and lump, left upper limb: Secondary | ICD-10-CM | POA: Insufficient documentation

## 2020-02-15 DIAGNOSIS — R350 Frequency of micturition: Secondary | ICD-10-CM | POA: Diagnosis not present

## 2020-02-15 DIAGNOSIS — N898 Other specified noninflammatory disorders of vagina: Secondary | ICD-10-CM | POA: Diagnosis not present

## 2020-02-15 LAB — POCT URINALYSIS DIP (MANUAL ENTRY)
Bilirubin, UA: NEGATIVE
Glucose, UA: NEGATIVE mg/dL
Ketones, POC UA: NEGATIVE mg/dL
Leukocytes, UA: NEGATIVE
Nitrite, UA: NEGATIVE
Protein Ur, POC: NEGATIVE mg/dL
Spec Grav, UA: >=1.03 — AB (ref 1.010–1.025)
Urobilinogen, UA: 0.2 E.U./dL
pH, UA: 5 (ref 5.0–8.0)

## 2020-02-15 NOTE — Assessment & Plan Note (Signed)
Acute. Unclear etiology. Nontender and does not affect wrist function or strength. Suspect ganglion cyst. Recommended monitoring. If enlarges or becomes symptomatic, recommend follow up. Can consider ultrasound vs x-ray at that time. Patient voiced understanding and agreement with plan.

## 2020-02-15 NOTE — Assessment & Plan Note (Signed)
Acute. Not sexually active. Post-menopausal. No abnormal findings on pelvic exam.  - Unable to do wet prep in office due to staffing issues. Follow up send out BV, GC/Cl, trich, yeast - Treat if indicated

## 2020-02-15 NOTE — Assessment & Plan Note (Signed)
Acute. No other UTI symptoms. UA unremarkable, however macroscopic RBC present - possibly from preceding pelvic exam which noted vaginal atrophy - follow up urine culture - recommend repeat UA in 2-4 weeks to ensure resolution of macroscopic hematuria

## 2020-02-17 LAB — CERVICOVAGINAL ANCILLARY ONLY
Bacterial Vaginitis (gardnerella): NEGATIVE
Candida Glabrata: NEGATIVE
Candida Vaginitis: NEGATIVE
Chlamydia: NEGATIVE
Comment: NEGATIVE
Comment: NEGATIVE
Comment: NEGATIVE
Comment: NEGATIVE
Comment: NEGATIVE
Comment: NORMAL
Neisseria Gonorrhea: NEGATIVE
Trichomonas: NEGATIVE

## 2020-02-17 LAB — URINE CULTURE

## 2020-03-17 ENCOUNTER — Other Ambulatory Visit: Payer: Self-pay | Admitting: Family Medicine

## 2020-03-17 DIAGNOSIS — Z0000 Encounter for general adult medical examination without abnormal findings: Secondary | ICD-10-CM

## 2020-05-02 ENCOUNTER — Telehealth: Payer: Self-pay

## 2020-05-02 NOTE — Telephone Encounter (Signed)
Patient calls nurse line to report positive COVID results from home testing.   Patient reports that she is having cough, runny nose and chills. Denies loss of taste and smell. Advised of supportive measures.   Please advise if patient would be a good candidate for infusion therapy.   Strict ED precautions given.    Talbot Grumbling, RN

## 2020-05-03 ENCOUNTER — Other Ambulatory Visit (HOSPITAL_COMMUNITY): Payer: Self-pay

## 2020-05-03 ENCOUNTER — Other Ambulatory Visit: Payer: Self-pay | Admitting: Family Medicine

## 2020-05-03 MED ORDER — MOLNUPIRAVIR 200 MG PO CAPS
800.0000 mg | ORAL_CAPSULE | Freq: Two times a day (BID) | ORAL | 0 refills | Status: AC
  Filled 2020-05-03: qty 40, 5d supply, fill #0

## 2020-05-03 NOTE — Telephone Encounter (Signed)
Called patient and informed of below.  ? ?Yvette Bell C [name], RN ? ?

## 2020-05-03 NOTE — Telephone Encounter (Signed)
I have prescribed molnupiravir for her to be picked up at the [facility].  This is easier for her to qualify for than the infusion therapy. She should take 4 capsules twice a day for 5 days.  Please call pt to let her know.

## 2020-05-03 NOTE — Progress Notes (Signed)
Prescribing for covid infection.

## 2020-05-16 ENCOUNTER — Other Ambulatory Visit: Payer: Self-pay

## 2020-05-17 MED ORDER — ALBUTEROL SULFATE HFA 108 (90 BASE) MCG/ACT IN AERS: 1.0000 | INHALATION_SPRAY | Freq: Four times a day (QID) | RESPIRATORY_TRACT | 0 refills | Status: DC | PRN

## 2020-05-25 ENCOUNTER — Emergency Department (HOSPITAL_COMMUNITY): Payer: Medicare Other

## 2020-05-25 ENCOUNTER — Emergency Department (HOSPITAL_COMMUNITY)
Admission: EM | Admit: 2020-05-25 | Discharge: 2020-05-25 | Disposition: A | Discharge status: Home / Self Care | Payer: Medicare Other | Attending: Emergency Medicine | Admitting: Emergency Medicine

## 2020-05-25 DIAGNOSIS — J4520 Mild intermittent asthma, uncomplicated: Secondary | ICD-10-CM | POA: Insufficient documentation

## 2020-05-25 DIAGNOSIS — Z7951 Long term (current) use of inhaled steroids: Secondary | ICD-10-CM | POA: Insufficient documentation

## 2020-05-25 DIAGNOSIS — I10 Essential (primary) hypertension: Secondary | ICD-10-CM | POA: Insufficient documentation

## 2020-05-25 DIAGNOSIS — R072 Precordial pain: Secondary | ICD-10-CM | POA: Insufficient documentation

## 2020-05-25 DIAGNOSIS — E039 Hypothyroidism, unspecified: Secondary | ICD-10-CM | POA: Diagnosis not present

## 2020-05-25 DIAGNOSIS — Z79899 Other long term (current) drug therapy: Secondary | ICD-10-CM | POA: Insufficient documentation

## 2020-05-25 LAB — CBC
HCT: 40.3 % (ref 36.0–46.0)
Hemoglobin: 12.9 g/dL (ref 12.0–15.0)
MCH: 29.2 pg (ref 26.0–34.0)
MCHC: 32 g/dL (ref 30.0–36.0)
MCV: 91.2 fL (ref 80.0–100.0)
Platelets: 222 10*3/uL (ref 150–400)
RBC: 4.42 MIL/uL (ref 3.87–5.11)
RDW: 14 % (ref 11.5–15.5)
WBC: 7.9 10*3/uL (ref 4.0–10.5)
nRBC: 0 % (ref 0.0–0.2)

## 2020-05-25 LAB — TROPONIN I (HIGH SENSITIVITY)
Troponin I (High Sensitivity): 3 ng/L (ref <18)
Troponin I (High Sensitivity): 3 ng/L (ref <18)

## 2020-05-25 LAB — BASIC METABOLIC PANEL
Anion gap: 6 (ref 5–15)
BUN: 12 mg/dL (ref 8–23)
CO2: 26 mmol/L (ref 22–32)
Calcium: 9.2 mg/dL (ref 8.9–10.3)
Chloride: 107 mmol/L (ref 98–111)
Creatinine, Ser: 0.7 mg/dL (ref 0.44–1.00)
GFR, Estimated: >60 mL/min (ref >60)
Glucose, Bld: 95 mg/dL (ref 70–99)
Potassium: 3.7 mmol/L (ref 3.5–5.1)
Sodium: 139 mmol/L (ref 135–145)

## 2020-05-25 LAB — D-DIMER, QUANTITATIVE: D-Dimer, Quant: 0.44 ug/mL-FEU (ref 0.00–0.50)

## 2020-05-25 MED ORDER — KETOROLAC TROMETHAMINE 30 MG/ML IJ SOLN: 30.0000 mg | Freq: Once | INTRAMUSCULAR | Status: DC

## 2020-05-25 MED ORDER — AEROCHAMBER PLUS FLO-VU LARGE MISC: 1.0000 | Freq: Once | Status: DC

## 2020-05-25 MED ORDER — ALBUTEROL SULFATE HFA 108 (90 BASE) MCG/ACT IN AERS: 2.0000 | INHALATION_SPRAY | Freq: Once | RESPIRATORY_TRACT | Status: DC

## 2020-05-25 NOTE — ED Notes (Signed)
Pt ambulated to and from bathroom independently. Pt returned to room, connected to monitor and provided with warm blanket.

## 2020-05-25 NOTE — ED Provider Notes (Signed)
[facility] EMERGENCY DEPARTMENT Provider Note   CSN: [id number] Arrival date & time: 05/25/20  1514     History Chief Complaint  Patient presents with  . Chest Pain    Left upper chest, dull, intermittent since noon, onset at rest    Yvette Bell is a 65 y.o. female.  Patient with history of hypertension and high cholesterol presents the emergency department for evaluation of left-sided chest pain.  She describes a cute onset of a dull left-sided chest pain without radiation around noon today.  Symptoms started at rest.  She has had pain like this in the past which she was told was "chest wall pain".  She states that the pain has been intermittent.  She has not had associated shortness of breath, vomiting, diaphoresis.  No exertional symptoms.  She states that the pain was 3 out of 10 at its worst.  Currently 2 out of 10.  EMS had given 1 nitro with temporary improvement.  Patient was recently diagnosed with COVID (05/02/20) and states that she has recovered with only a residual cough currently.  Patient denies risk factors for pulmonary embolism including: unilateral leg swelling, history of DVT/PE/other blood clots, use of exogenous hormones, recent immobilizations, recent surgery, recent travel (>4hr segment), malignancy, hemoptysis.         Past Medical History:  Diagnosis Date  . Acute cholecystitis 09/14/2016  . Allergy   . Anxiety   . Arthritis   . ASCUS PAP 11/25/2009   Qualifier: Diagnosis of  By: [name], [name]    . Asthma   . Blackhead 07/31/2016  . Cataract    bil eyes  . Depression   . Fibroid tumor   . GERD (gastroesophageal reflux disease)   . Healthcare maintenance 03/13/2018  . Hyperlipidemia   . Hypertension   . HYPOKALEMIA 03/30/2009   Qualifier: History of  By: [name], Cindee Salt    . 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 Lipoma of torso 03/11/2017  . Mass on back 10/25/2015  . NECK SPASM 02/02/2010   Qualifier: Diagnosis of   By: [name]  MD, Hosie Poisson    . Rectal bleeding 06/17/2014  . Skin sensitivity 01/22/2015  . Thyroid disease    hyperactive  . Tremulousness 12/26/2016  . UTERINE FIBROID 03/21/2006   Qualifier: Diagnosis of  By: [name], [name]      Patient Active Problem List   Diagnosis Date Noted  . Lump of left wrist 02/15/2020  . 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 Hyperglycemia 10/01/2019  . Urinary frequency 10/01/2019  . Myalgia 03/25/2019  . Eczema 03/25/2019  . Paresthesia of left arm 03/13/2018  . Other abnormal glucose 03/13/2018  . Acute right-sided low back pain 03/11/2017  . Seasonal allergic rhinitis due to pollen 09/06/2016  . Mild intermittent asthma, uncomplicated 78/46/9629  . Food allergy 07/31/2016  . Vaginal discharge 10/18/2015  . Goiter 02/17/2014  . Heart murmur, systolic 52/84/1324  . OBESITY 01/19/2008  . HYPOTHYROIDISM, HX OF 07/05/2006  . HYPERTENSION, BENIGN SYSTEMIC 03/21/2006    Past Surgical History:  Procedure Laterality Date  . cesearean section    . CHOLECYSTECTOMY N/A 09/14/2016   Procedure: LAPAROSCOPIC CHOLECYSTECTOMY WITH INTRAOPERATIVE CHOLANGIOGRAM;  Surgeon: Alphonsa Overall, MD;  Location: [facility];  Service: General;  Laterality: N/A;  . HERNIA REPAIR       OB History   No obstetric history on file.     Family History  Problem  Relation Age of Onset  . Stomach cancer Father   . Colon polyps Sister   . Diabetes Sister   . Colon cancer Neg Hx   . Esophageal cancer Neg Hx   . Gallbladder disease Neg Hx   . Rectal cancer Neg Hx     Social History   Tobacco Use  . Smoking status: Never Smoker  . Smokeless tobacco: Never Used  Vaping Use  . Vaping Use: Never used  Substance Use Topics  . Alcohol use: Yes    Alcohol/week: 0.0 standard drinks    Comment: Occassionally  . Drug use: No    Home Medications Prior to Admission medications   Medication Sig Start Date End Date Taking?  Authorizing Provider  albuterol (VENTOLIN HFA) 108 (90 Base) MCG/ACT inhaler Inhale 1-2 puffs into the lungs every 6 (six) hours as needed for wheezing or shortness of breath. 05/17/20   Benay Pike, MD  chlorpheniramine-HYDROcodone [facility] PENNKINETIC ER) 10-8 MG/5ML SUER Take 5 mLs by mouth at bedtime as needed for cough. 02/27/18   Charlann Lange, PA-C  EPINEPHrine 0.3 mg/0.3 mL IJ SOAJ injection Inject 0.3 mg into the muscle as needed for anaphylaxis.  07/31/16   [provider]  fluticasone (FLONASE) 50 MCG/ACT nasal spray Place 2 sprays into both nostrils daily. 09/06/16   Valentina Shaggy, MD  hydrochlorothiazide (HYDRODIURIL) 25 MG tablet Take 1 tablet (25 mg total) by mouth at bedtime. 10/27/19   Benay Pike, MD  triamcinolone ointment (KENALOG) 0.1 % Apply 1 application topically 2 (two) times daily. For 2 weeks. 03/24/19   Benay Pike, MD    Allergies    Morphine and Pollen extract  Review of Systems   Review of Systems  Constitutional: Negative for diaphoresis and fever.  Eyes: Negative for redness.  Respiratory: Negative for cough and shortness of breath.   Cardiovascular: Positive for chest pain. Negative for palpitations and leg swelling.  Gastrointestinal: Negative for abdominal pain, nausea and vomiting.  Genitourinary: Negative for dysuria.  Musculoskeletal: Negative for back pain and neck pain.  Skin: Negative for rash.  Neurological: Negative for syncope and light-headedness.  Psychiatric/Behavioral: The patient is not nervous/anxious.     Physical Exam Updated Vital Signs BP 124/85   Pulse (!) 57   Temp 98.2 F (36.8 C) (Oral)   Resp 18   LMP 08/20/2011   SpO2 98%   Physical Exam Vitals and nursing note reviewed.  Constitutional:      Appearance: She is well-developed. She is not diaphoretic.  HENT:     Head: Normocephalic and atraumatic.     Mouth/Throat:     Mouth: Mucous membranes are not dry.  Eyes:     Conjunctiva/sclera:  Conjunctivae normal.  Neck:     Vascular: Normal carotid pulses. No carotid bruit or JVD.     Trachea: Trachea normal. No tracheal deviation.  Cardiovascular:     Rate and Rhythm: Normal rate and regular rhythm.     Pulses: No decreased pulses.     Heart sounds: Normal heart sounds, S1 normal and S2 normal. No murmur heard.   Pulmonary:     Effort: Pulmonary effort is normal. No respiratory distress.     Breath sounds: No wheezing.  Chest:     Chest wall: Tenderness present.     Comments: Pushing over the area of pain makes the patient wince. She states palpation makes the pain worse.  Abdominal:     General: Bowel sounds are normal.  Palpations: Abdomen is soft.     Tenderness: There is no abdominal tenderness. There is no guarding or rebound.  Musculoskeletal:        General: Normal range of motion.     Cervical back: Normal range of motion and neck supple. No muscular tenderness.  Skin:    General: Skin is warm and dry.     Coloration: Skin is not pale.  Neurological:     Mental Status: She is alert.     ED Results / Procedures / Treatments   Labs (all labs ordered are listed, but only abnormal results are displayed) Labs Reviewed  BASIC METABOLIC PANEL  CBC  D-DIMER, QUANTITATIVE  TROPONIN I (HIGH SENSITIVITY)  TROPONIN I (HIGH SENSITIVITY)    EKG EKG Interpretation  Date/Time:  Wednesday May 25 2020 17:30:42 EDT Ventricular Rate:  61 PR Interval:  159 QRS Duration: 75 QT Interval:  422 QTC Calculation: 426 R Axis:   65 Text Interpretation: Sinus rhythm No significant change since last tracing Confirmed by Isla Pence [phone]) on 05/25/2020 5:34:35 PM   Radiology DG Chest Portable 1 View  Result Date: 05/25/2020 CLINICAL DATA:  Chest pain, history of recent COVID-19 infection EXAM: PORTABLE CHEST 1 VIEW COMPARISON:  01/12/2020 FINDINGS: The heart size and mediastinal contours are within normal limits. Both lungs are clear. The visualized skeletal  structures are unremarkable. IMPRESSION: No active disease. Electronically Signed   By: Inez Catalina M.D.   On: 05/25/2020 16:59    Procedures Procedures   Medications Ordered in ED Medications  ketorolac (TORADOL) 30 MG/ML injection 30 mg (has no administration in time range)  albuterol (VENTOLIN HFA) 108 (90 Base) MCG/ACT inhaler 2 puff (has no administration in time range)  AeroChamber Plus Flo-Vu Large MISC 1 each (has no administration in time range)    ED Course  I have reviewed the triage vital signs and the nursing notes.  Pertinent labs & imaging results that were available during my care of the patient were reviewed by me and considered in my medical decision making (see chart for details).  Patient seen and examined. Work-up initiated. Discussed with Dr. Gilford Raid. Initial work-up reassuring. Pending 2nd trop, d-dimer.  Vital signs reviewed and are as follows: BP 124/85   Pulse (!) 57   Temp 98.2 F (36.8 C) (Oral)   Resp 18   LMP 08/20/2011   SpO2 98%   10:38 PM 2nd trop neg. D-dimer neg. Pt continues to do well.  She will continue to use her home albuterol inhaler for residual cough after COVID infection.  She looks good and is not in any distress at time of discharge.  She does not have concerns about being discharged.  Patient was counseled to return with severe chest pain, especially if the pain is crushing or pressure-like and spreads to the arms, back, neck, or jaw, or if they have sweating, nausea, or shortness of breath with the pain. They were encouraged to call 911 with these symptoms.   The patient verbalized understanding and agreed.     MDM Rules/Calculators/A&P                          CP: Reproducible, atypical for ACS.  Patient was evaluated with EKG which was normal without any ischemic changes.  2 troponins which were both normal and not uptrending.  D-dimer which was negative.  Chest x-ray which did not show any abnormalities.  Vital signs in the  emergency  department are normal and stable.  Patient possibly with chest wall pain or residual pleurisy/bronchitis from recent COVID infection.  She looks well and stable for discharge home.  Encouraged PCP follow-up.  Return instructions as above.    Final Clinical Impression(s) / ED Diagnoses Final diagnoses:  Precordial pain    Rx / DC Orders ED Discharge Orders    None       Carlisle Cater, PA-C 05/25/20 2240    Isla Pence, MD 05/25/20 2249

## 2020-05-25 NOTE — ED Notes (Signed)
Patient verbalizes understanding of discharge instructions. Opportunity for questioning and answers were provided. Armband removed by staff, pt discharged from ED ambulatory.   

## 2020-05-25 NOTE — ED Notes (Signed)
MD notified of pt BP - pt cleared for DC. PT educated to take BP med

## 2020-05-25 NOTE — Discharge Instructions (Signed)
Please read and follow all provided instructions.  Your diagnoses today include:  1. Precordial pain     Tests performed today include:  An EKG of your heart  A chest x-ray  Cardiac enzymes - a blood test for heart muscle damage  Blood counts and electrolytes  Vital signs. See below for your results today.   Medications prescribed:   Take any prescribed medications only as directed.  Follow-up instructions: Please follow-up with your primary care provider as soon as you can for further evaluation of your symptoms.   Return instructions:  SEEK IMMEDIATE MEDICAL ATTENTION IF:  You have severe chest pain, especially if the pain is crushing or pressure-like and spreads to the arms, back, neck, or jaw, or if you have sweating, nausea (feeling sick to your stomach), or shortness of breath. THIS IS AN EMERGENCY. Don't wait to see if the pain will go away. Get medical help at once. Call 911 or 0 (operator). DO NOT drive yourself to the hospital.   Your chest pain gets worse and does not go away with rest.   You have an attack of chest pain lasting longer than usual, despite rest and treatment with the medications your caregiver has prescribed.   You wake from sleep with chest pain or shortness of breath.  You feel dizzy or faint.  You have chest pain not typical of your usual pain for which you originally saw your caregiver.   You have any other emergent concerns regarding your health.  Additional Information: Chest pain comes from many different causes. Your caregiver has diagnosed you as having chest pain that is not specific for one problem, but does not require admission.  You are at low risk for an acute heart condition or other serious illness.   Your vital signs today were: BP 124/85   Pulse (!) 57   Temp 98.2 F (36.8 C) (Oral)   Resp 18   LMP 08/20/2011   SpO2 98%  If your blood pressure (BP) was elevated above 135/85 this visit, please have this repeated by your  doctor within one month. --------------

## 2020-05-25 NOTE — ED Triage Notes (Signed)
Pt arrived via [facility] with c/o CP that started around noon today described as left sided, dull, and intermittent. Onset at rest. Denies SOB, N/V, no diaphoresis. EMS reports they administered 1 nitro which relieved pain while in their care however pain increased again on arrival to ED per EMS.

## 2020-06-08 ENCOUNTER — Other Ambulatory Visit: Payer: Self-pay | Admitting: Family Medicine

## 2020-06-14 ENCOUNTER — Ambulatory Visit (INDEPENDENT_AMBULATORY_CARE_PROVIDER_SITE_OTHER): Payer: Medicare Other

## 2020-06-14 DIAGNOSIS — Z0000 Encounter for general adult medical examination without abnormal findings: Secondary | ICD-10-CM

## 2020-06-14 NOTE — Progress Notes (Addendum)
Subjective:   Yvette Bell is a 65 y.o. female who presents for Medicare Annual (Subsequent) preventive examination.  Patient consented to have virtual visit and was identified by name and date of birth. Method of visit: Telephone  Encounter participants: Patient: [name] - located at Home Nurse/Provider: Dorna Bloom - located at [facility]  Others (if applicable): NA  Review of Systems: Defer to PCP.   Objective:   Vitals: LMP 08/20/2011   There is no height or weight on file to calculate BMI.  Advanced Directives 06/14/2020 01/12/2020 02/27/2018 12/21/2017 11/25/2017 10/03/2017 03/08/2017  Does Patient Have a Medical Advance Directive? No No No No No No No  Would patient like information on creating a medical advance directive? No - Patient declined - No - Patient declined - - No - Patient declined No - Patient declined   Tobacco Social History   Tobacco Use  Smoking Status Never Smoker  Smokeless Tobacco Never Used     Clinical Intake:  Pre-visit preparation completed: Yes  Pain Score: 0-No pain  How often do you need to have someone help you when you read instructions, pamphlets, or other written materials from your doctor or pharmacy?: 2 - Rarely What is the last grade level you completed in school?: High School  Interpreter Needed?: No  Past Medical History:  Diagnosis Date  . Acute cholecystitis 09/14/2016  . Allergy   . Anxiety   . Arthritis   . ASCUS PAP 11/25/2009   Qualifier: Diagnosis of  By: [name], [name]    . Asthma   . [place]head 07/31/2016  . Cataract    bil eyes  . Depression   . Fibroid tumor   . GERD (gastroesophageal reflux disease)   . Healthcare maintenance 03/13/2018  . Hyperlipidemia   . Hypertension   . HYPOKALEMIA 03/30/2009   Qualifier: History of  By: [name], Cindee Salt    . 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 Lipoma of torso 03/11/2017  . Mass on back 10/25/2015  . NECK SPASM 02/02/2010   Qualifier: Diagnosis of  By:  [name]  MD, Hosie Poisson    . Rectal bleeding 06/17/2014  . Skin sensitivity 01/22/2015  . Thyroid disease    hyperactive  . Tremulousness 12/26/2016  . UTERINE FIBROID 03/21/2006   Qualifier: Diagnosis of  By: [name], [name]     Past Surgical History:  Procedure Laterality Date  . cesearean section    . CHOLECYSTECTOMY N/A 09/14/2016   Procedure: LAPAROSCOPIC CHOLECYSTECTOMY WITH INTRAOPERATIVE CHOLANGIOGRAM;  Surgeon: Alphonsa Overall, MD;  Location: [facility];  Service: General;  Laterality: N/A;  . HERNIA REPAIR     Family History  Problem Relation Age of Onset  . Stomach cancer Father   . Colon polyps Sister   . Diabetes Sister   . Colon cancer Neg Hx   . Esophageal cancer Neg Hx   . Gallbladder disease Neg Hx   . Rectal cancer Neg Hx    Social History   Socioeconomic History  . Marital status: Divorced    Spouse name: Not on file  . Number of children: 3  . Years of education: 34  . Highest education level: High school graduate  Occupational History  . Occupation: Disability  Tobacco Use  . Smoking status: Never Smoker  . Smokeless tobacco: Never Used  Vaping Use  . Vaping Use: Never used  Substance and Sexual Activity  . Alcohol use: Yes    Alcohol/week: 0.0 standard drinks  Comment: Occassionally  . Drug use: No  . Sexual activity: Not Currently    Birth control/protection: Post-menopausal  Other Topics Concern  . Not on file  Social History Narrative   Patient lives alone in [place].   Patient has 3 children and 10 grandchildren.    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   Patient has a close circle of friends.    Patient dances and walks daily for exercise.    Social Determinants of Health   Financial Resource Strain: Low Risk   . Difficulty of Paying Living Expenses: Not hard at all  Food Insecurity: No Food Insecurity  . Worried About Charity fundraiser in the Last Year: Never true  . Ran Out of Food in the Last Year:  Never true  Transportation Needs: No Transportation Needs  . Lack of Transportation (Medical): No  . Lack of Transportation (Non-Medical): No  Physical Activity: Sufficiently Active  . Days of Exercise per Week: 7 days  . Minutes of Exercise per Session: 60 min  Stress: Stress Concern Present  . Feeling of Stress : To some extent  Social Connections: Moderately Integrated  . Frequency of Communication with Friends and Family: More than three times a week  . Frequency of Social Gatherings with Friends and Family: More than three times a week  . Attends Religious Services: More than 4 times per year  . Active Member of Clubs or Organizations: Yes  . Attends Archivist Meetings: More than 4 times per year  . Marital Status: Divorced   Outpatient Encounter Medications as of 06/14/2020  Medication Sig  . EPINEPHrine 0.3 mg/0.3 mL IJ SOAJ injection Inject 0.3 mg into the muscle as needed for anaphylaxis.   . fluticasone (FLONASE) 50 MCG/ACT nasal spray Place 2 sprays into both nostrils daily.  . hydrochlorothiazide (HYDRODIURIL) 25 MG tablet Take 1 tablet (25 mg total) by mouth at bedtime.  . triamcinolone ointment (KENALOG) 0.1 % Apply 1 application topically 2 (two) times daily. For 2 weeks.  . VENTOLIN HFA 108 (90 Base) MCG/ACT inhaler INHALE 1 TO 2 PUFFS INTO THE LUNGS EVERY 6 HOURS AS NEEDED FOR WHEEZING OR SHORTNESS OF BREATH  . [DISCONTINUED] chlorpheniramine-HYDROcodone (TUSSIONEX PENNKINETIC ER) 10-8 MG/5ML SUER Take 5 mLs by mouth at bedtime as needed for cough. (Patient not taking: Reported on 06/14/2020)   No facility-administered encounter medications on file as of 06/14/2020.   Activities of Daily Living In your present state of health, do you have any difficulty performing the following activities: 06/14/2020  Hearing? N  Vision? N  Difficulty concentrating or making decisions? N  Walking or climbing stairs? N  Dressing or bathing? N  Doing errands, shopping? N   Preparing Food and eating ? N  Using the Toilet? N  In the past six months, have you accidently leaked urine? N  Do you have problems with loss of bowel control? N  Managing your Medications? N  Managing your Finances? N  Housekeeping or managing your Housekeeping? N  Some recent data might be hidden   Patient Care Team: Benay Pike, MD as PCP - General    Assessment:   This is a routine wellness examination for Sharni.  Exercise Activities and Dietary recommendations Current Exercise Habits: Home exercise routine, Type of exercise: strength training/weights;stretching;walking, Time (Minutes): 60, Frequency (Times/Week): 7, Weekly Exercise (Minutes/Week): 420, Intensity: Moderate, Exercise limited by: None identified  Goals    . Blood Pressure <  140/90     Patient has been focused on lowering her blood pressure.  Patient has been working on losing weight and meditation.       Fall Risk Fall Risk  06/14/2020 03/24/2019 03/08/2017 07/31/2016 12/19/2015  Falls in the past year? 0 0 No No No  Number falls in past yr: - - - - -  Risk Factor Category  - - - - -  Follow up Falls prevention discussed - - - -   Is the patient's home free of loose throw rugs in walkways, pet beds, electrical cords, etc?   yes      Grab bars in the bathroom? yes      Handrails on the stairs?   yes      Adequate lighting?   yes  Patient rating of health (0-10) scale: 8  Depression Screen PHQ 2/9 Scores 06/14/2020 02/15/2020 10/01/2019 03/24/2019  PHQ - 2 Score 2 2 2 1   PHQ- 9 Score 7 7 5  -    Cognitive Function 6CIT Screen 06/14/2020  What Year? 0 points  What month? 0 points  What time? 0 points  Count back from 20 0 points  Months in reverse 0 points  Repeat phrase 0 points  Total Score 0   Immunization History  Administered Date(s) Administered  . PFIZER(Purple Top)SARS-COV-2 Vaccination 10/01/2019, 10/22/2019  . Td 01/23/1996, 01/19/2008   Screening Tests Health Maintenance  Topic Date  Due  . TETANUS/TDAP  01/18/2018  . COVID-19 Vaccine (3 - Booster for Pfizer series) 03/21/2020  . INFLUENZA VACCINE  08/22/2020  . PAP SMEAR-Modifier  03/12/2021  . MAMMOGRAM  04/20/2021  . COLONOSCOPY (Pts 45-33yrs Insurance coverage will need to be confirmed)  07/12/2024  . Hepatitis C Screening  Completed  . HIV Screening  Completed  . HPV VACCINES  Aged Out   Cancer Screenings: Lung: Low Dose CT Chest recommended if Age 39-80 years, 30 pack-year currently smoking OR have quit w/in 15years. Patient does not qualify. Breast:  Up to date on Mammogram? Yes   Up to date of Bone Density/Dexa? NA Colorectal: UTD  Additional Screenings: Hepatitis C Screening: Completed  HIV Screening: HIV  Pap Smear: Due 02/2021    Plan:  PCP apt scheduled for 6/20 @10 :30am. Keep up the daily exercise and meditation.  You are due for your Booster.  Fill out an advance directive packet.   I have personally reviewed and noted the following in the patient's chart:   . Medical and social history . Use of alcohol, tobacco or illicit drugs  . Current medications and supplements . Functional ability and status . Nutritional status . Physical activity . Advanced directives . List of other physicians . Hospitalizations, surgeries, and ER visits in previous 12 months . Vitals . Screenings to include cognitive, depression, and falls . Referrals and appointments  In addition, I have reviewed and discussed with patient certain preventive protocols, quality metrics, and best practice recommendations. A written personalized care plan for preventive services as well as general preventive health recommendations were provided to patient.  This visit was conducted virtually in the setting of the [place] pandemic.    Dorna Bloom, [place]  06/14/2020    I have reviewed this visit and agree with the documentation.   Addison Naegeli, MD

## 2020-06-14 NOTE — Patient Instructions (Signed)
You spoke to Yvette Bell, [place] over the phone for your annual wellness visit.  We discussed goals: Goals    . Blood Pressure < 140/90     Patient has been focused on lowering her blood pressure.  Patient has been working on losing weight and meditation.       We also discussed recommended health maintenance. As discussed, you are due for the following.   Health Maintenance  Topic Date Due  . TETANUS/TDAP  01/18/2018  . COVID-19 Vaccine (3 - Booster for Pfizer series) 03/21/2020  . INFLUENZA VACCINE  08/22/2020  . PAP SMEAR-Modifier  03/12/2021  . MAMMOGRAM  04/20/2021  . COLONOSCOPY (Pts 45-25yrs Insurance coverage will need to be confirmed)  07/12/2024  . Hepatitis C Screening  Completed  . HIV Screening  Completed  . HPV VACCINES  Aged Out   PCP apt scheduled for 6/20 @10 :30am. Keep up the daily exercise and meditation.  You are due for your Booster.  Fill out an advance directive packet.   Health Maintenance, Female Adopting a healthy lifestyle and getting preventive care are important in promoting health and wellness. Ask your health care provider about:  The right schedule for you to have regular tests and exams.  Things you can do on your own to prevent diseases and keep yourself healthy. What should I know about diet, weight, and exercise? Eat a healthy diet  Eat a diet that includes plenty of vegetables, fruits, low-fat dairy products, and lean protein.  Do not eat a lot of foods that are high in solid fats, added sugars, or sodium.   Maintain a healthy weight Body mass index (BMI) is used to identify weight problems. It estimates body fat based on height and weight. Your health care provider can help determine your BMI and help you achieve or maintain a healthy weight. Get regular exercise Get regular exercise. This is one of the most important things you can do for your health. Most adults should:  Exercise for at least 150 minutes each week. The exercise  should increase your heart rate and make you sweat (moderate-intensity exercise).  Do strengthening exercises at least twice a week. This is in addition to the moderate-intensity exercise.  Spend less time sitting. Even light physical activity can be beneficial. Watch cholesterol and blood lipids Have your blood tested for lipids and cholesterol at 63 years of age, then have this test every 5 years. Have your cholesterol levels checked more often if:  Your lipid or cholesterol levels are high.  You are older than 63 years of age.  You are at high risk for heart disease. What should I know about cancer screening? Depending on your health history and family history, you may need to have cancer screening at various ages. This may include screening for:  Breast cancer.  Cervical cancer.  Colorectal cancer.  Skin cancer.  Lung cancer. What should I know about heart disease, diabetes, and high blood pressure? Blood pressure and heart disease  High blood pressure causes heart disease and increases the risk of stroke. This is more likely to develop in people who have high blood pressure readings, are of African descent, or are overweight.  Have your blood pressure checked: ? Every 3-5 years if you are 1-80 years of age. ? Every year if you are 56 years old or older. Diabetes Have regular diabetes screenings. This checks your fasting blood sugar level. Have the screening done:  Once every three years after age 53  if you are at a normal weight and have a low risk for diabetes.  More often and at a younger age if you are overweight or have a high risk for diabetes. What should I know about preventing infection? Hepatitis B If you have a higher risk for hepatitis B, you should be screened for this virus. Talk with your health care provider to find out if you are at risk for hepatitis B infection. Hepatitis C Testing is recommended for:  Everyone born from [birth year] through  1963.  Anyone with known risk factors for hepatitis C. Sexually transmitted infections (STIs)  Get screened for STIs, including gonorrhea and chlamydia, if: ? You are sexually active and are younger than 63 years of age. ? You are older than 63 years of age and your health care provider tells you that you are at risk for this type of infection. ? Your sexual activity has changed since you were last screened, and you are at increased risk for chlamydia or gonorrhea. Ask your health care provider if you are at risk.  Ask your health care provider about whether you are at high risk for HIV. Your health care provider may recommend a prescription medicine to help prevent HIV infection. If you choose to take medicine to prevent HIV, you should first get tested for HIV. You should then be tested every 3 months for as long as you are taking the medicine. Pregnancy  If you are about to stop having your period (premenopausal) and you may become pregnant, seek counseling before you get pregnant.  Take 400 to 800 micrograms (mcg) of folic acid every day if you become pregnant.  Ask for birth control (contraception) if you want to prevent pregnancy. Osteoporosis and menopause Osteoporosis is a disease in which the bones lose minerals and strength with aging. This can result in bone fractures. If you are 48 years old or older, or if you are at risk for osteoporosis and fractures, ask your health care provider if you should:  Be screened for bone loss.  Take a calcium or vitamin D supplement to lower your risk of fractures.  Be given hormone replacement therapy (HRT) to treat symptoms of menopause. Follow these instructions at home: Lifestyle  Do not use any products that contain nicotine or tobacco, such as cigarettes, e-cigarettes, and chewing tobacco. If you need help quitting, ask your health care provider.  Do not use street drugs.  Do not share needles.  Ask your health care provider for  help if you need support or information about quitting drugs. Alcohol use  Do not drink alcohol if: ? Your health care provider tells you not to drink. ? You are pregnant, may be pregnant, or are planning to become pregnant.  If you drink alcohol: ? Limit how much you use to 0-1 drink a day. ? Limit intake if you are breastfeeding.  Be aware of how much alcohol is in your drink. In the [place] one drink equals one 12 oz bottle of beer (355 mL), one 5 oz glass of wine (148 mL), or one 1 oz glass of hard liquor (44 mL). General instructions  Schedule regular health, dental, and eye exams.  Stay current with your vaccines.  Tell your health care provider if: ? You often feel depressed. ? You have ever been abused or do not feel safe at home. Summary  Adopting a healthy lifestyle and getting preventive care are important in promoting health and wellness.  Follow your health care  provider's instructions about healthy diet, exercising, and getting tested or screened for diseases.  Follow your health care provider's instructions on monitoring your cholesterol and blood pressure. This information is not intended to replace advice given to you by your health care provider. Make sure you discuss any questions you have with your health care provider. Document Revised: 01/01/2018 Document Reviewed: 01/01/2018 Elsevier Patient Education  2021 [place].   Our clinic's number is [phone]. Please call with questions or concerns about what we discussed today.

## 2020-07-11 ENCOUNTER — Ambulatory Visit: Payer: Medicare Other | Admitting: Family Medicine

## 2020-07-11 NOTE — Progress Notes (Deleted)
    SUBJECTIVE:   CHIEF COMPLAINT / HPI:   Htn:   Eczema  Asthma     Health Maintenance Due  Topic Date Due   Pneumococcal Vaccine 39-63 Years old (1 - PCV) Never done   Zoster Vaccines- Shingrix (1 of 2) Never done   TETANUS/TDAP  01/18/2018   COVID-19 Vaccine (3 - Pfizer risk series) 11/19/2019     PERTINENT  PMH / PSH:   OBJECTIVE:   LMP 08/20/2011   ***  ASSESSMENT/PLAN:   No problem-specific Assessment & Plan notes found for this encounter.     Benay Pike, MD [place]   {    This will disappear when note is signed, click to select method of visit    :1}

## 2020-07-20 NOTE — Progress Notes (Signed)
    SUBJECTIVE:   CHIEF COMPLAINT / HPI:   Htn: Patient is not taking her hydrochlorothiazide.  Instead she has been taking a half a cup to a cup of juice and a tablespoon of apple cider vinegar every day.  She says that at home her blood pressure is normal and is sometimes "as low as 112".  Not taking any herbal supplements or over-the-counter medications.  Not taking any of her home medications.  Still uses her inhaler occasionally.  Last time was when she was sick with a respiratory infection.  Patient does not smoke cigarettes, rarely drinks excess alcohol and does not use drugs.  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  Patient willing to get pneumococcal vaccine today.  Does not want to get COVID-vaccine right now and wants to wait on the shingles vaccine.   PERTINENT  PMH / PSH: HTN  OBJECTIVE:   BP 140/80   Pulse 86   Ht 5\' 5"  (1.651 m)   Wt 184 lb 4 oz (83.6 kg)   LMP 08/20/2011   SpO2 98%   BMI 30.66 kg/m   General: Alert and oriented.  No acute distress. HEENT: PERRLA, EOMI, moist oral mucosa. Neck: No thyroid nodules appreciated. CV: Regular rate and rhythm, no murmurs Pulmonary: Lungs clear to auscultation bilaterally GI: Soft, nontender.  Normal bowel sounds.  ASSESSMENT/PLAN:   HYPERTENSION, BENIGN SYSTEMIC Patient is not taking her HCTZ anymore.  Using beet juice and vinegar to help control her blood pressure.  Blood pressure was 140/80 today, but patient states she takes it at home and is usually lower than this.  Advised patient I would not like to see it much higher than it is right now.  Advised her to return to clinic if she starts to notice her blood pressure increasing.  Healthcare maintenance Pneumococcal vaccine given today.  Paper prescription for shingles vaccine given with refill for her second dose 1 month after the first 1.  Patient declines COVID booster today.   Up-to-date on other health maintenance issues.     Benay Pike, MD [place]

## 2020-07-21 ENCOUNTER — Encounter: Payer: Self-pay | Admitting: Family Medicine

## 2020-07-21 ENCOUNTER — Ambulatory Visit (INDEPENDENT_AMBULATORY_CARE_PROVIDER_SITE_OTHER): Payer: Medicare Other | Admitting: Family Medicine

## 2020-07-21 ENCOUNTER — Other Ambulatory Visit: Payer: Self-pay

## 2020-07-21 VITALS — BP 140/80 | HR 86 | Ht 65.0 in | Wt 184.2 lb

## 2020-07-21 DIAGNOSIS — I10 Essential (primary) hypertension: Secondary | ICD-10-CM | POA: Diagnosis not present

## 2020-07-21 DIAGNOSIS — Z0000 Encounter for general adult medical examination without abnormal findings: Secondary | ICD-10-CM | POA: Diagnosis not present

## 2020-07-21 DIAGNOSIS — Z23 Encounter for immunization: Secondary | ICD-10-CM

## 2020-07-21 MED ORDER — ZOSTER VAC RECOMB ADJUVANTED 50 MCG/0.5ML IM SUSR: 0.5000 mL | Freq: Once | INTRAMUSCULAR | 1 refills | Status: AC

## 2020-07-21 NOTE — Patient Instructions (Addendum)
It was nice to see you today,  I have given you a written prescription for the shingles vaccine which you can take to any pharmacy.  We have given you the pneumococcal vaccine today.  You can take Tylenol if you develop fever or headache tomorrow.  Please continue to monitor your blood pressure at home.  If it becomes high again please let us know.  It is safe to take the beet juice and vinegar.  Have a great day,  Clemetine Marker, MD

## 2020-07-21 NOTE — Assessment & Plan Note (Signed)
Patient is not taking her HCTZ anymore.  Using beet juice and vinegar to help control her blood pressure.  Blood pressure was 140/80 today, but patient states she takes it at home and is usually lower than this.  Advised patient I would not like to see it much higher than it is right now.  Advised her to return to clinic if she starts to notice her blood pressure increasing.

## 2020-07-21 NOTE — Assessment & Plan Note (Signed)
Pneumococcal vaccine given today.  Paper prescription for shingles vaccine given with refill for her second dose 1 month after the first 1.  Patient declines COVID booster today.  Up-to-date on other health maintenance issues.

## 2020-07-22 ENCOUNTER — Ambulatory Visit
Admission: RE | Admit: 2020-07-22 | Discharge: 2020-07-22 | Disposition: A | Discharge status: Home / Self Care | Payer: Medicare Other | Source: Ambulatory Visit | Attending: Internal Medicine | Admitting: Internal Medicine

## 2020-07-22 DIAGNOSIS — Z0000 Encounter for general adult medical examination without abnormal findings: Secondary | ICD-10-CM

## 2020-09-21 NOTE — Patient Instructions (Addendum)
It was great to see you today! Thank you for choosing [facility] for your primary care. Yvette Bell was seen for vaginal discharge and discoloration on top of her feet/ankles.  Our plans for today were:  -Assess feet/ankles: We took a picture to monitor this.  I am not concerned at this time as there are no symptoms.  Please return if you have new symptom onset or you notice a change in the appearance. -Vaginal discharge: we performed a wet mount to assess for yeast and BV.  We will give you a call later today for the results and let you know if we need to prescribe any medication -Pain with urination: We performed a urine dipstick.  I will give you a call later today with results and if we need to prescribe any medication. -Hypertension: I am concerned with the very high blood pressures upon your visit regardless of you having not taken your blood pressure regimen.  I do advise starting blood pressure medication at this time.  At this time we opted to have you revisit since you are asymptomatic and please return within 1 month for blood pressure check having taken your medications and bring your diary and blood pressure device so that we can check to see if this calibrated.  You should return to our clinic within 1 month for blood pressure follow up.   Take care and seek immediate care sooner if you develop any concerns.   Thank you for allowing me to participate in your care, Wells Guiles, DO 09/22/2020, 10:53 AM PGY-1, [place]

## 2020-09-21 NOTE — Progress Notes (Signed)
SUBJECTIVE:   CHIEF COMPLAINT / HPI:   Discoloration on top of feet: Patient states that she has noticed discoloration of the top of her feet and ankles for 1 week.  The symptoms such as burning/itching/pain.  She wanted to ensure that this was not a concern.  She has not tried any medications or lotions on her feet.  Vaginal Discharge: Patient is a 65 y.o. female presenting with vaginal discharge for 3 weeks.  She states the discharge is of brown consistency and is about dime sized each day.  She states it is only brown when she takes her beet juice for her high blood pressure and she knows that does staying her urine.  She is not sexually active and declined STD testing at this time.  Dysuria: Patient notes pain with urination and suprapubic pain for 3 weeks.  She has had UTIs in the past.  She does take beet juice (which she has been doing for the last year) which is when she feels the pain sometimes and alters the color of her urine.  Hypertension: Blood pressure on presentation 169/105.  Recheck 183/116.  She has not taken her juice and vinegar for blood pressure at this time.  Notes that her blood pressures at home are usually 130/70 to 80s both in the daytime and at night when she takes her beet juice and apple cider vinegar.  She denies any chest pain, shortness of breath, headaches, palpitations.  Discoloration on top of feet and ankles: Pt notes that she has had discoloration Of her feet and ankles for last week.  Denies any itching/pain or swelling.  She notes that she does have varicose veins which have happened as she is aged.  PERTINENT  PMH / PSH: Hypertension  OBJECTIVE:   BP (!) 183/116   Pulse 84   Ht '5\' 5"'$  (1.651 m)   Wt 185 lb 6.4 oz (84.1 kg)   LMP 08/20/2011   SpO2 100%   BMI 30.85 kg/m    General: NAD, pleasant, able to participate in exam Cardiovascular: RRR, no murmurs auscultated Respiratory: Normal effort, no obvious respiratory distress Abdomen: Soft,  nondistended, no abdominal or suprapubic pain on palpation Pelvic: VULVA: normal appearing vulva with no masses, tenderness or lesions, VAGINA: Normal appearing vagina with normal color, no lesions, with minimal white discharge present CERVIX: No lesions, scant and white discharge present.  Patient tolerated exam well  Chaperone Alexis present for pelvic exam  ASSESSMENT/PLAN:   Discoloration of skin of foot Picture taken in place and in chart.  I did not appreciate any discoloration.  Physical exam unremarkable.  Advised patient to return if new symptoms arise or discoloration worsens.  Dysuria Pain with urination x3 weeks intermittently.  She has had UTIs in the past.  She does note that her urine changes color when she takes her beet juice. Urine dipstick/UA noted moderate blood, RBC 0-3. Looking back at previous UAs, patient has had moderate-large blood with minimal RBCs in the past. Will require further workup upon follow-up visit. Initial plan to obtain UA. As patient's age is >36, may further require cytology and renal U/S. Results and plan discussed with patient over the phone same day of visit.   HYPERTENSION, BENIGN SYSTEMIC Blood pressure on presentation 169/105.  Recheck 183/116.  Patient did not take blood pressure regimen of beet juice and vinegar today.  I have advised patient that I would strongly suggest starting blood pressure medication at this time.  She is averse  to starting this as she previously did not take her medication and her blood pressure checks at home have been in normal ranges.  Strongly urged patient to return within 1 month for recheck and bring in her blood pressure diary in addition to contacting manufacturer of her digital blood pressure cuff to ensure calibration is appropriate.  Patient opted to return within 1 month to continue discussion and consider blood pressure medication.  Vaginal discharge Minimal brown discharge noted by patient daily for 3 weeks.   Wet prep today.  Patient declined STI testing as she has not been sexually active for years. Wet Mount noted moderate bacteria, WBC 1-5. No trich, yeast or clue cells. Attributed bacteria to vaginal flora. No need to treat at this time. Results discussed with patient over the phone same day of visit.    Advised pt to return within the month for blood pressure f/u.  Wells Guiles, DO 09/22/2020, 2:15 PM PGY-1, [place]

## 2020-09-22 ENCOUNTER — Telehealth: Payer: Self-pay | Admitting: Student

## 2020-09-22 ENCOUNTER — Encounter: Payer: Self-pay | Admitting: Student

## 2020-09-22 ENCOUNTER — Ambulatory Visit (INDEPENDENT_AMBULATORY_CARE_PROVIDER_SITE_OTHER): Payer: Medicare Other | Admitting: Student

## 2020-09-22 ENCOUNTER — Other Ambulatory Visit: Payer: Self-pay

## 2020-09-22 DIAGNOSIS — R300 Dysuria: Secondary | ICD-10-CM

## 2020-09-22 DIAGNOSIS — I10 Essential (primary) hypertension: Secondary | ICD-10-CM | POA: Diagnosis not present

## 2020-09-22 DIAGNOSIS — N898 Other specified noninflammatory disorders of vagina: Secondary | ICD-10-CM

## 2020-09-22 DIAGNOSIS — L819 Disorder of pigmentation, unspecified: Secondary | ICD-10-CM | POA: Diagnosis not present

## 2020-09-22 DIAGNOSIS — Z0000 Encounter for general adult medical examination without abnormal findings: Secondary | ICD-10-CM

## 2020-09-22 LAB — POCT WET PREP (WET MOUNT)
Clue Cells Wet Prep Whiff POC: NEGATIVE
Trichomonas Wet Prep HPF POC: ABSENT

## 2020-09-22 LAB — POCT URINALYSIS DIP (MANUAL ENTRY)
Bilirubin, UA: NEGATIVE
Glucose, UA: NEGATIVE mg/dL
Ketones, POC UA: NEGATIVE mg/dL
Leukocytes, UA: NEGATIVE
Nitrite, UA: NEGATIVE
Protein Ur, POC: NEGATIVE mg/dL
Spec Grav, UA: 1.025 (ref 1.010–1.025)
Urobilinogen, UA: 0.2 E.U./dL
pH, UA: 5.5 (ref 5.0–8.0)

## 2020-09-22 LAB — POCT UA - MICROSCOPIC ONLY

## 2020-09-22 NOTE — Assessment & Plan Note (Addendum)
Blood pressure on presentation 169/105.  Recheck 183/116.  Patient did not take blood pressure regimen of beet juice and vinegar today.  I have advised patient that I would strongly suggest starting blood pressure medication at this time.  She is averse to starting this as she previously did not take her medication and her blood pressure checks at home have been in normal ranges.  Strongly urged patient to return within 1 month for recheck and bring in her blood pressure diary in addition to contacting manufacturer of her digital blood pressure cuff to ensure calibration is appropriate.  Patient opted to return within 1 month to continue discussion and consider blood pressure medication.

## 2020-09-22 NOTE — Telephone Encounter (Signed)
Spoke with patient over the phone discussed results of hematuria on urinalysis with minimal RBC (0-3).  Also discussed bacteria on wet mount without yeast, clue cells, trichomoniasis.  Discussed with patient that treatment for bacteria is not indicated at this time as I believe this to be due to vaginal flora.  Hematuria coincidently with previous hematuria on labs is suspicious this patient has only been taking beet juice for no longer than a year.  We will further investigate at her follow-up within the next month.  Patient thankful for call.

## 2020-09-22 NOTE — Assessment & Plan Note (Signed)
Picture taken in place and in chart.  I did not appreciate any discoloration.  Physical exam unremarkable.  Advised patient to return if new symptoms arise or discoloration worsens.

## 2020-09-22 NOTE — Assessment & Plan Note (Addendum)
Minimal brown discharge noted by patient daily for 3 weeks.  Wet prep today.  Patient declined STI testing as she has not been sexually active for years. Wet Mount noted moderate bacteria, WBC 1-5. No trich, yeast or clue cells. Attributed bacteria to vaginal flora. No need to treat at this time. Results discussed with patient over the phone same day of visit.

## 2020-09-22 NOTE — Assessment & Plan Note (Addendum)
Pain with urination x3 weeks intermittently.  She has had UTIs in the past.  She does note that her urine changes color when she takes her beet juice (only taking for the last year). Urine dipstick/UA noted moderate blood, RBC 0-3. Looking back at previous UAs, patient has had moderate-large blood with minimal RBCs in the past. Will require further workup upon follow-up visit. Initial plan to obtain UA. As patient's age is >22, may further require cytology and renal U/S. Results and plan discussed with patient over the phone same day of visit.

## 2020-10-20 ENCOUNTER — Encounter: Payer: Self-pay | Admitting: Student

## 2020-12-22 ENCOUNTER — Telehealth: Payer: Self-pay | Admitting: Student

## 2020-12-22 NOTE — Telephone Encounter (Signed)
Patient is dropping off handicap placard to be filled out by Dr. Madison Hickman. LDOS 09-22-20.  She would like for someone to call her when the form is completed and ready to be picked up at the front desk.   I have placed form in white team folder.

## 2020-12-23 NOTE — Telephone Encounter (Signed)
Clinical info completed on handicap placard form.  Placed form in Dr. Philbert Riser box for completion.  Ottis Stain, CMA

## 2020-12-26 ENCOUNTER — Telehealth: Payer: Self-pay | Admitting: Student

## 2020-12-26 NOTE — Telephone Encounter (Signed)
Called patient's phone regarding handicap placard.  Left voicemail that I would call back later today.  Attempting to get clarity on reason for handicap placard request.

## 2020-12-26 NOTE — Telephone Encounter (Signed)
Attempted to call patient again regarding handicap placard form.  Left message advising that I would attempt to call again tomorrow.

## 2020-12-29 NOTE — Telephone Encounter (Signed)
Copy made and placed in batch scanning. Will place in outgoing mail, per patient request.   Talbot Grumbling, RN

## 2020-12-31 ENCOUNTER — Emergency Department (HOSPITAL_COMMUNITY)
Admission: EM | Admit: 2020-12-31 | Discharge: 2020-12-31 | Disposition: A | Discharge status: Home / Self Care | Payer: Medicare Other | Attending: Emergency Medicine | Admitting: Emergency Medicine

## 2020-12-31 ENCOUNTER — Other Ambulatory Visit: Payer: Self-pay

## 2020-12-31 ENCOUNTER — Encounter: Payer: Self-pay | Admitting: Physician Assistant

## 2020-12-31 ENCOUNTER — Encounter (HOSPITAL_COMMUNITY): Payer: Self-pay | Admitting: Emergency Medicine

## 2020-12-31 ENCOUNTER — Ambulatory Visit
Admission: EM | Admit: 2020-12-31 | Discharge: 2020-12-31 | Disposition: A | Discharge status: Nursing Home (Includes ICF) | Payer: Medicare Other | Attending: Physician Assistant | Admitting: Physician Assistant

## 2020-12-31 ENCOUNTER — Emergency Department (HOSPITAL_COMMUNITY): Payer: Medicare Other

## 2020-12-31 DIAGNOSIS — J4520 Mild intermittent asthma, uncomplicated: Secondary | ICD-10-CM | POA: Insufficient documentation

## 2020-12-31 DIAGNOSIS — R079 Chest pain, unspecified: Secondary | ICD-10-CM

## 2020-12-31 DIAGNOSIS — I10 Essential (primary) hypertension: Secondary | ICD-10-CM | POA: Insufficient documentation

## 2020-12-31 DIAGNOSIS — I160 Hypertensive urgency: Secondary | ICD-10-CM | POA: Diagnosis not present

## 2020-12-31 DIAGNOSIS — R072 Precordial pain: Secondary | ICD-10-CM | POA: Diagnosis not present

## 2020-12-31 DIAGNOSIS — E039 Hypothyroidism, unspecified: Secondary | ICD-10-CM | POA: Diagnosis not present

## 2020-12-31 LAB — CBC WITH DIFFERENTIAL/PLATELET
Abs Immature Granulocytes: 0.03 10*3/uL (ref 0.00–0.07)
Basophils Absolute: 0 10*3/uL (ref 0.0–0.1)
Basophils Relative: 0 %
Eosinophils Absolute: 0 10*3/uL (ref 0.0–0.5)
Eosinophils Relative: 0 %
HCT: 44.3 % (ref 36.0–46.0)
Hemoglobin: 14.3 g/dL (ref 12.0–15.0)
Immature Granulocytes: 0 %
Lymphocytes Relative: 60 %
Lymphs Abs: 8.3 10*3/uL — ABNORMAL HIGH (ref 0.7–4.0)
MCH: 29.3 pg (ref 26.0–34.0)
MCHC: 32.3 g/dL (ref 30.0–36.0)
MCV: 90.8 fL (ref 80.0–100.0)
Monocytes Absolute: 0.6 10*3/uL (ref 0.1–1.0)
Monocytes Relative: 5 %
Neutro Abs: 4.7 10*3/uL (ref 1.7–7.7)
Neutrophils Relative %: 35 %
Platelets: 245 10*3/uL (ref 150–400)
RBC: 4.88 MIL/uL (ref 3.87–5.11)
RDW: 14 % (ref 11.5–15.5)
WBC: 13.7 10*3/uL — ABNORMAL HIGH (ref 4.0–10.5)
nRBC: 0 % (ref 0.0–0.2)

## 2020-12-31 LAB — COMPREHENSIVE METABOLIC PANEL
ALT: 21 U/L (ref 0–44)
AST: 21 U/L (ref 15–41)
Albumin: 4.2 g/dL (ref 3.5–5.0)
Alkaline Phosphatase: 93 U/L (ref 38–126)
Anion gap: 7 (ref 5–15)
BUN: 11 mg/dL (ref 8–23)
CO2: 26 mmol/L (ref 22–32)
Calcium: 9.6 mg/dL (ref 8.9–10.3)
Chloride: 106 mmol/L (ref 98–111)
Creatinine, Ser: 0.66 mg/dL (ref 0.44–1.00)
GFR, Estimated: >60 mL/min (ref >60)
Glucose, Bld: 103 mg/dL — ABNORMAL HIGH (ref 70–99)
Potassium: 3.6 mmol/L (ref 3.5–5.1)
Sodium: 139 mmol/L (ref 135–145)
Total Bilirubin: 1 mg/dL (ref 0.3–1.2)
Total Protein: 8 g/dL (ref 6.5–8.1)

## 2020-12-31 LAB — TROPONIN I (HIGH SENSITIVITY)
Troponin I (High Sensitivity): 3 ng/L (ref <18)
Troponin I (High Sensitivity): 3 ng/L (ref <18)

## 2020-12-31 LAB — LIPASE, BLOOD: Lipase: 38 U/L (ref 11–51)

## 2020-12-31 LAB — MAGNESIUM: Magnesium: 2 mg/dL (ref 1.7–2.4)

## 2020-12-31 MED ORDER — NITROGLYCERIN 2 % TD OINT
1.0000 [in_us] | TOPICAL_OINTMENT | Freq: Once | TRANSDERMAL | Status: AC
  Administered 2020-12-31: 1 [in_us] via TOPICAL
  Filled 2020-12-31: qty 1

## 2020-12-31 MED ORDER — IOHEXOL 350 MG/ML SOLN
75.0000 mL | Freq: Once | INTRAVENOUS | Status: AC | PRN
  Administered 2020-12-31: 75 mL via INTRAVENOUS

## 2020-12-31 MED ORDER — ASPIRIN 81 MG PO CHEW
324.0000 mg | CHEWABLE_TABLET | Freq: Once | ORAL | Status: AC
  Administered 2020-12-31: 324 mg via ORAL
  Filled 2020-12-31: qty 4

## 2020-12-31 NOTE — ED Triage Notes (Signed)
Arrives via EMS from [place] office, complains of intermittent CP x2 days, no chest pain currently. UC reported HTN. EKG WNL per EMS.

## 2020-12-31 NOTE — ED Provider Notes (Signed)
[facility]    CSN: [id number] Arrival date & time: 12/31/20  1243      History   Chief Complaint Chief Complaint  Patient presents with   Chest Pain    HPI Matilde Pottenger is a 65 y.o. female.   Patient here today for evaluation of intermittent chest pain that is been ongoing for the last several days.  She reports that pain is present to the right side of her chest but also substernally at times.  She does have some shortness of breath when she has chest pain.  She states chest pain seems to be worse when she is stressed.  Episodes of chest pain have lasted between 10 and 15 minutes before resolution.  She does have history of hypertension and has not been taking medication for same.  She denies any significant headaches or lightheadedness.  She is not currently having chest pain at this time.  The history is provided by the patient.  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   Lipoma of torso 03/11/2017   Mass on back 10/25/2015   NECK SPASM 02/02/2010   Qualifier: Diagnosis of  By: [name]  MD, [name]     Rectal bleeding 06/17/2014   Skin sensitivity 01/22/2015   Thyroid disease    hyperactive   Tremulousness 12/26/2016   UTERINE FIBROID 03/21/2006   Qualifier: Diagnosis of  By: [name], [name]      Patient Active Problem List   Diagnosis Date Noted   Discoloration of skin of  foot 09/22/2020   Lump of left wrist 02/15/2020   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   Urate crystals present on microscopy 10/03/2019   Hyperglycemia 10/01/2019   Myalgia 03/25/2019   Eczema 03/25/2019   Healthcare maintenance 03/13/2018   Other abnormal glucose 03/13/2018   Seasonal allergic rhinitis due to pollen 09/06/2016   Mild intermittent asthma, uncomplicated 50/53/9767   Food allergy 07/31/2016   Vaginal discharge 10/18/2015   Dysuria 09/14/2015   Goiter 02/17/2014   Heart murmur, systolic 34/19/3790   OBESITY 01/19/2008   HYPOTHYROIDISM, HX OF 07/05/2006   HYPERTENSION, BENIGN SYSTEMIC 03/21/2006    Past Surgical History:  Procedure Laterality Date   cesearean section     CHOLECYSTECTOMY N/A 09/14/2016   Procedure: LAPAROSCOPIC CHOLECYSTECTOMY WITH INTRAOPERATIVE CHOLANGIOGRAM;  Surgeon: Alphonsa Overall, MD;  Location: [facility];  Service: General;  Laterality: N/A;   HERNIA REPAIR     LIPOMA EXCISION      OB History   No obstetric history on file.      Home Medications    Prior to Admission medications   Medication Sig Start Date End Date Taking? Authorizing Provider  Ascorbic Acid (VITAMIN C PO) Take by mouth.   Yes [provider]  MAGNESIUM PO  Take by mouth.   Yes [provider]  EPINEPHrine 0.3 mg/0.3 mL IJ SOAJ injection Inject 0.3 mg into the muscle as needed for anaphylaxis.  07/31/16   [provider]  triamcinolone ointment (KENALOG) 0.1 % Apply 1 application topically 2 (two) times daily. For 2 weeks. 03/24/19   Benay Pike, MD  VENTOLIN HFA 108 [phone] Base) MCG/ACT inhaler INHALE 1 TO 2 PUFFS INTO THE LUNGS EVERY 6 HOURS AS NEEDED FOR WHEEZING OR SHORTNESS OF BREATH 06/08/20   Benay Pike, MD    Family History Family History  Problem Relation Age of Onset   Stomach cancer Father    Colon polyps Sister    Diabetes Sister    Colon cancer Neg Hx    Esophageal cancer Neg Hx    Gallbladder disease Neg Hx    Rectal  cancer Neg Hx     Social History Social History   Tobacco Use   Smoking status: Never   Smokeless tobacco: Never  Vaping Use   Vaping Use: Never used  Substance Use Topics   Alcohol use: Yes    Alcohol/week: 0.0 standard drinks    Comment: Occassionally   Drug use: No     Allergies   Morphine and Pollen extract   Review of Systems Review of Systems  Constitutional:  Negative for chills and fever.  Eyes:  Negative for discharge and redness.  Respiratory:  Negative for shortness of breath.   Cardiovascular:  Positive for chest pain.  Gastrointestinal:  Negative for abdominal pain, nausea and vomiting.  Genitourinary:  Positive for vaginal bleeding and vaginal discharge.  Neurological:  Negative for light-headedness and headaches.    Physical Exam Triage Vital Signs ED Triage Vitals  Enc Vitals Group     BP 12/31/20 1249 (!) 184/108     Pulse Rate 12/31/20 1249 91     Resp 12/31/20 1249 18     Temp 12/31/20 1249 98.5 F (36.9 C)     Temp Source 12/31/20 1249 Oral     SpO2 12/31/20 1249 98 %     Weight --      Height --      Head Circumference --      Peak Flow --      Pain Score 12/31/20 1251 0     Pain Loc --      Pain Edu? --      Excl. in [place]? --    No data found.  Updated Vital Signs BP (!) 184/108 Comment: not on HTN meds.  Pulse 91   Temp 98.5 F (36.9 C) (Oral)   Resp 18   LMP 08/20/2011   SpO2 98%   Physical Exam Vitals and nursing note reviewed.  Constitutional:      General: She is not in acute distress.    Appearance: Normal appearance. She is not ill-appearing.  HENT:     Head: Normocephalic and atraumatic.  Eyes:     Conjunctiva/sclera: Conjunctivae normal.  Cardiovascular:     Rate and Rhythm: Normal rate and regular rhythm.     Heart sounds: Normal heart sounds. No murmur heard. Pulmonary:     Effort: Pulmonary effort is normal. No respiratory distress.     Breath sounds: Normal breath sounds. No wheezing, rhonchi or rales.   Neurological:     Mental Status: She is alert.  Psychiatric:        Mood and Affect: Mood normal.        Behavior: Behavior  normal.        Thought Content: Thought content normal.     UC Treatments / Results  Labs (all labs ordered are listed, but only abnormal results are displayed) Labs Reviewed - No data to display  EKG   Radiology No results found.  Procedures Procedures (including critical care time)  Medications Ordered in UC Medications - No data to display  Initial Impression / Assessment and Plan / UC Course  I have reviewed the triage vital signs and the nursing notes.  Pertinent labs & imaging results that were available during my care of the patient were reviewed by me and considered in my medical decision making (see chart for details).    EKG with normal sinus rhythm, however given significantly elevated blood pressure, untreated hypertension and other risk factors recommended further evaluation in the emergency department.  Concerns with patient driving to emergency department given elevated blood pressure and recommended transport by EMS with patient agrees to.  Final Clinical Impressions(s) / UC Diagnoses   Final diagnoses:  Chest pain, unspecified type  Hypertensive urgency   Discharge Instructions   None    ED Prescriptions   None    PDMP not reviewed this encounter.   Francene Finders, PA-C 12/31/20 1310

## 2020-12-31 NOTE — ED Triage Notes (Signed)
C/O right lower chest pain intermittently over past 2 days, with occasional radiation into substernal area. States pain not associated with any dyspnea or nausea. Denies any c/o's or chest pain at this time.  Reports hx HTN - states was told by PCP she can control with beet juice and apple cider vinegar.

## 2020-12-31 NOTE — ED Provider Notes (Signed)
[facility] DEPT Provider Note   CSN: [id number] Arrival date & time: 12/31/20  1353     History Chief Complaint  Patient presents with   Chest Pain    Yvette Bell is a 65 y.o. female.   Chest Pain Associated symptoms: no abdominal pain, no back pain, no cough, no dizziness, no fatigue, no fever, no headache, no nausea, no numbness, no palpitations, no shortness of breath, no vomiting and no weakness   Patient resents for 2 days of intermittent chest pain.  Chest pain is located to the right of sternum.  When it occurs, it is mild in nature.  Patient denies any current pain.  The pain does not radiate.  She denies any associated shortness of breath, dizziness, lightheadedness, shortness of breath, or cough.  Her medical history is notable for hypertension.  Per chart review, it appears that she was prescribed HCTZ in the past.  Her last primary care doctor visit was in June.  At that time, she was planning on managing her blood pressure at home with diet and supplements rather than medications.  Additional risk factors for ACS include HLD.  Patient has no personal or family history of ACS.  She denies any current tobacco use. HPI: A 65 year old patient with a history of hypertension and hypercholesterolemia presents for evaluation of chest pain. Initial onset of pain was less than one hour ago. The patient's chest pain is worse with exertion. The patient's chest pain is middle- or left-sided, is not well-localized, is not described as heaviness/pressure/tightness, is not sharp and does not radiate to the arms/jaw/neck. The patient does not complain of nausea and denies diaphoresis. The patient has no history of stroke, has no history of peripheral artery disease, has not smoked in the past 90 days, denies any history of treated diabetes, has no relevant family history of coronary artery disease (first degree relative at less than age 48) and does not have an  elevated BMI (>=30).   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   Lipoma of torso 03/11/2017   Mass on back 10/25/2015   NECK SPASM 02/02/2010   Qualifier: Diagnosis of  By: [name]  MD, [name]     Rectal bleeding 06/17/2014   Skin sensitivity 01/22/2015   Thyroid disease    hyperactive   Tremulousness 12/26/2016   UTERINE FIBROID 03/21/2006   Qualifier: Diagnosis of  By: [name], [name]      Patient Active Problem List   Diagnosis Date Noted   Discoloration of skin of foot 09/22/2020   Lump of left wrist 02/15/2020   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   Urate crystals present on microscopy 10/03/2019   Hyperglycemia 10/01/2019   Myalgia 03/25/2019   Eczema 03/25/2019   Healthcare maintenance 03/13/2018   Other abnormal glucose 03/13/2018   Seasonal allergic rhinitis due to pollen 09/06/2016   Mild intermittent asthma, uncomplicated 41/74/0814  Food allergy 07/31/2016   Vaginal discharge 10/18/2015   Dysuria 09/14/2015   Goiter 02/17/2014   Heart murmur, systolic 88/28/0034   OBESITY 01/19/2008   HYPOTHYROIDISM, HX OF 07/05/2006   HYPERTENSION, BENIGN SYSTEMIC 03/21/2006    Past Surgical History:  Procedure Laterality Date   cesearean section     CHOLECYSTECTOMY N/A 09/14/2016   Procedure: LAPAROSCOPIC CHOLECYSTECTOMY WITH INTRAOPERATIVE CHOLANGIOGRAM;  Surgeon: Alphonsa Overall, MD;  Location: [facility];  Service: General;  Laterality: N/A;   HERNIA REPAIR     LIPOMA EXCISION       OB History   No obstetric  history on file.     Family History  Problem Relation Age of Onset   Stomach cancer Father    Colon polyps Sister    Diabetes Sister    Colon cancer Neg Hx    Esophageal cancer Neg Hx    Gallbladder disease Neg Hx    Rectal cancer Neg Hx     Social History   Tobacco Use   Smoking status: Never   Smokeless tobacco: Never  Vaping Use   Vaping Use: Never used  Substance Use Topics   Alcohol use: Yes    Alcohol/week: 0.0 standard drinks    Comment: Occassionally   Drug use: No    Home Medications Prior to Admission medications   Medication Sig Start Date End Date Taking? Authorizing Provider  Ascorbic Acid (VITAMIN C PO) Take by mouth.    [provider]  EPINEPHrine 0.3 mg/0.3 mL IJ SOAJ injection Inject 0.3 mg into the muscle as needed for anaphylaxis.  07/31/16   [provider]  MAGNESIUM PO Take by mouth.    [provider]  triamcinolone ointment (KENALOG) 0.1 % Apply 1 application topically 2 (two) times daily. For 2 weeks. 03/24/19   Benay Pike, MD  VENTOLIN HFA 108 [phone] Base) MCG/ACT inhaler INHALE 1 TO 2 PUFFS INTO THE LUNGS EVERY 6 HOURS AS NEEDED FOR WHEEZING OR SHORTNESS OF BREATH 06/08/20   Benay Pike, MD    Allergies    Morphine and Pollen extract  Review of Systems   Review of Systems  Constitutional:  Negative for activity change, appetite change, chills, fatigue and fever.  HENT:  Negative for congestion, ear pain and sore throat.   Eyes:  Negative for pain and visual disturbance.  Respiratory:  Negative for cough, choking, chest tightness, shortness of breath and wheezing.   Cardiovascular:  Positive for chest pain. Negative for palpitations and leg swelling.  Gastrointestinal:  Negative for abdominal pain, diarrhea, nausea and vomiting.  Genitourinary:  Negative for dysuria, flank pain, hematuria and pelvic pain.  Musculoskeletal:  Negative for arthralgias, back pain, gait problem, joint swelling, myalgias and neck  pain.  Skin:  Negative for color change and rash.  Neurological:  Negative for dizziness, seizures, syncope, weakness, light-headedness, numbness and headaches.  Hematological:  Does not bruise/bleed easily.  Psychiatric/Behavioral:  Negative for confusion and decreased concentration.   All other systems reviewed and are negative.  Physical Exam Updated Vital Signs BP 130/86 (BP Location: Left Arm)   Pulse 90   Temp 98 F (36.7 C) (Oral)   Resp 15   LMP 08/20/2011   SpO2 95%   Physical Exam Vitals and nursing note reviewed.  Constitutional:      General: She is not in acute distress.    Appearance: She is well-developed. She is obese. She is not ill-appearing, toxic-appearing or diaphoretic.  HENT:  Head: Normocephalic and atraumatic.  Eyes:     Conjunctiva/sclera: Conjunctivae normal.  Cardiovascular:     Rate and Rhythm: Normal rate and regular rhythm.     Heart sounds: Normal heart sounds. No murmur heard. Pulmonary:     Effort: Pulmonary effort is normal. No respiratory distress.     Breath sounds: Normal breath sounds. No decreased breath sounds, wheezing, rhonchi or rales.  Chest:     Chest wall: No tenderness.  Abdominal:     Palpations: Abdomen is soft.     Tenderness: There is no abdominal tenderness.  Musculoskeletal:        General: No swelling.     Cervical back: Normal range of motion and neck supple.     Right lower leg: No edema.     Left lower leg: No edema.  Skin:    General: Skin is warm and dry.     Capillary Refill: Capillary refill takes less than 2 seconds.     Coloration: Skin is not cyanotic or pale.  Neurological:     General: No focal deficit present.     Mental Status: She is alert and oriented to person, place, and time.     Cranial Nerves: No cranial nerve deficit.     Motor: No weakness.  Psychiatric:        Mood and Affect: Mood normal.        Behavior: Behavior normal.    ED Results / Procedures / Treatments   Labs (all labs  ordered are listed, but only abnormal results are displayed) Labs Reviewed  CBC WITH DIFFERENTIAL/PLATELET - Abnormal; Notable for the following components:      Result Value   WBC 13.7 (*)    Lymphs Abs 8.3 (*)    All other components within normal limits  COMPREHENSIVE METABOLIC PANEL - Abnormal; Notable for the following components:   Glucose, Bld 103 (*)    All other components within normal limits  MAGNESIUM  LIPASE, BLOOD  TROPONIN I (HIGH SENSITIVITY)  TROPONIN I (HIGH SENSITIVITY)    EKG EKG Interpretation  Date/Time:  Saturday December 31 2020 20:49:17 EST Ventricular Rate:  68 PR Interval:  144 QRS Duration: 97 QT Interval:  425 QTC Calculation: 452 R Axis:   72 Text Interpretation: Sinus rhythm Confirmed by Godfrey Pick ([id number]) on 12/31/2020 8:55:38 PM  Radiology CT Angio Chest PE W and/or Wo Contrast  Result Date: 12/31/2020 CLINICAL DATA:  Intermittent chest pain for 2 days. Pulmonary embolism suspected, high probability. EXAM: CT ANGIOGRAPHY CHEST WITH CONTRAST TECHNIQUE: Multidetector CT imaging of the chest was performed using the standard protocol during bolus administration of intravenous contrast. Multiplanar CT image reconstructions and MIPs were obtained to evaluate the vascular anatomy. CONTRAST:  4mL OMNIPAQUE IOHEXOL 350 MG/ML SOLN COMPARISON:  08/20/2009. FINDINGS: Cardiovascular: The heart is normal in size and there is no pericardial effusion. A few scattered coronary artery calcifications are noted. The aorta and pulmonary trunk are normal in caliber. No definite pulmonary artery filling defect is identified. Evaluation of the pulmonary arteries at the lung bases is limited due to respiratory motion artifact. Mediastinum/Nodes: No mediastinal, hilar, or axillary lymphadenopathy. The thyroid gland is diffusely enlarged and heterogeneous in attenuation, resulting in mild narrowing of the midtrachea. The esophagus is unremarkable. Lungs/Pleura: Dependent  atelectasis bilaterally. No effusion or pneumothorax Upper Abdomen: The gallbladder is surgically absent. No acute abnormality. Musculoskeletal: Mild degenerative changes in the thoracic spine. No acute osseous abnormality. Review of the MIP images confirms the  above findings. IMPRESSION: 1. Known evidence of pulmonary embolism or other acute process. 2. Diffusely enlarged thyroid gland with heterogeneous attenuation. Ultrasound is suggested for further evaluation on follow-up. 3. Scattered coronary artery calcifications. Electronically Signed   By: Brett Fairy M.D.   On: 12/31/2020 23:21   DG Chest Portable 1 View  Result Date: 12/31/2020 CLINICAL DATA:  Intermittent chest pain for 2 days EXAM: PORTABLE CHEST 1 VIEW COMPARISON:  05/25/2020 FINDINGS: The heart size and mediastinal contours are within normal limits. No focal airspace consolidation, pleural effusion, or pneumothorax. The visualized skeletal structures are unremarkable. IMPRESSION: No active disease. Electronically Signed   By: Davina Poke D.O.   On: 12/31/2020 14:59    Procedures Procedures   Medications Ordered in ED Medications  aspirin chewable tablet 324 mg (324 mg Oral Given 12/31/20 2047)  nitroGLYCERIN (NITROGLYN) 2 % ointment 1 inch (1 inch Topical Given 12/31/20 2047)  iohexol (OMNIPAQUE) 350 MG/ML injection 75 mL (75 mLs Intravenous Contrast Given 12/31/20 2256)    ED Course  I have reviewed the triage vital signs and the nursing notes.  Pertinent labs & imaging results that were available during my care of the patient were reviewed by me and considered in my medical decision making (see chart for details).    MDM Rules/Calculators/A&P HEAR Score: 3                       Patient is a 65 year old female with history of HTN, HLD, presenting for 2 days of intermittent chest pain.  Currently, she is asymptomatic.  Vital signs in the ED are notable for hypertension.  Patient has history of hypertension but has  declined use of medications in the past.  EKG shows no evidence of ischemia.  Based on symptoms, risk factors, and EKG findings, patient is a heart score of 3.  Prior to being bedded in the ED, lab work was obtained.  Initial troponin was normal.  Lab work was otherwise notable for normal electrolytes.  She does have a leukocytosis with a with lymphocyte predominance.  ASA was given.  Patient was also given nitroglycerin ointment.  Following this, she had resolution of hypertension.  She denied any recurrence of symptoms while in the ED.  CTA of chest was negative for PE.  There were incidental findings of enlarged thyroid gland as well as coronary calcifications.  Patient would benefit from cardiology follow-up to determine need of further testing.  [facility] contact information was provided.  Patient was also advised of thyroid finding on CT scan.  She states that she has had a history of thyroid disease and was previously on medications.  She was advised to follow-up with her primary care doctor for further thyroid testing/surveillance.  She was also advised to discuss restarting hypertensive medications with her primary care doctor.  Patient was encouraged to return the ED for any worsening symptoms.  She was discharged in good condition.  Final Clinical Impression(s) / ED Diagnoses Final diagnoses:  Chest pain, unspecified type    Rx / DC Orders ED Discharge Orders     None        Godfrey Pick, MD 01/01/21 [phone]

## 2020-12-31 NOTE — Discharge Instructions (Signed)
Schedule appointment with your primary care doctor soon as possible.  During this follow-up appointment, talk to her about blood pressure medications as well as thyroid surveillance.  There is also a number below to call to establish care with cardiology.  They may recommend further outpatient testing.  If you develop any new or worsening symptoms, please return to the emergency department.

## 2020-12-31 NOTE — ED Provider Notes (Signed)
Emergency Medicine Provider Triage Evaluation Note  Yvette Bell , a 65 y.o. female  was evaluated in triage.  Pt complains of right sided chest pain  Review of Systems  Positive: Pain right chest for 2 days Negative: fever  Physical Exam  BP (!) 168/91   Pulse 68   Temp 98.1 F (36.7 C) (Oral)   Resp 17   LMP 08/20/2011   SpO2 100%  Gen:   Awake, no distress   Resp:  Normal effort  MSK:   Moves extremities without difficulty  Other:    Medical Decision Making  Medically screening exam initiated at 2:31 PM.  Appropriate orders placed.  Shalaina Guardiola was informed that the remainder of the evaluation will be completed by another provider, this initial triage assessment does not replace that evaluation, and the importance of remaining in the ED until their evaluation is complete.     Fransico Meadow, [place] 12/31/20 1432    Valarie Merino, MD 12/31/20 2251

## 2020-12-31 NOTE — ED Notes (Signed)
NSR via monitor.

## 2020-12-31 NOTE — ED Notes (Signed)
Report given to [facility].

## 2021-01-13 ENCOUNTER — Telehealth: Payer: Self-pay | Admitting: Family Medicine

## 2021-01-13 NOTE — Telephone Encounter (Signed)
**  After Hours/ Emergency Line Call**  Received a call to report that Yvette Bell was wanting to check in as she just found out that she was COVID positive. She has been feeling a little bit more fatigued/drowsier than usual and has increased sputum. She is not having any fever or chills and feels like her symptoms are very mild. She has had COVID before and was prescribed albuterol the last time, which she still has. Discussed with patient appropriate albuterol use as well as strict return precautions and red flags. Patient voiced understanding and will be further evaluated if having to consistently use the albuterol. All questions were answered and patient was explained the quarantine and masking recommendations. Will forward to PCP.  Rise Patience, DO PGY-2, [facility] 01/13/2021 3:57 PM

## 2021-01-13 NOTE — Telephone Encounter (Signed)
After-hours call Received page for after-hours call from this patient.  Attempted to call twice without answer.  If she calls back please read page and we will call her as soon as we are able.

## 2021-01-31 ENCOUNTER — Encounter (HOSPITAL_COMMUNITY): Payer: Self-pay | Admitting: Radiology

## 2021-02-15 NOTE — Progress Notes (Signed)
°  SUBJECTIVE:   CHIEF COMPLAINT / HPI:   Chest pain: pt here for f/u of chest pain. Denies chest pain, dyspnea on exertion, palpitations. States she also had covid back in December.   HTN: Currently she is watching her salt intake, and using beet juice and apple cider vinegar. She is also attempting to lose weight. She is using her elliptical about everyday in addition to walking and dancing. She estimates she is getting 5-6 hours of exercise per week.  She is not interested in blood pressure medications today although is contemplating it for the future. She has used HCTZ in the past, states it made her sleep and she had to urinate a lot.   Hematuria: Patient does not appreciate any gross hematuria.  She recalls that she saw urology in September for this and was advised to follow-up in 3 months.  States she will call them to schedule follow-up appointment.  PERTINENT  PMH / PSH: HTN  OBJECTIVE:  BP (!) 178/108    Pulse (!) 104    Ht 5\' 5"  (1.651 m)    Wt 190 lb 2 oz (86.2 kg)    LMP 08/20/2011    SpO2 100%    BMI 31.64 kg/m   General: NAD, pleasant, able to participate in exam HEENT: No appreciable nodules of thyroid gland upon palpation Cardiac: RRR, no murmurs. 2/6 systolic murmur appreciated left sternal border Respiratory: CTAB, normal effort, No wheezes, rales or rhonchi  ASSESSMENT/PLAN:  Thyromegaly Thyromegaly appreciated on CTA for PE during ED visit.  Ordered TSH and thyroid ultrasound.  Hematuria Noted upon prior UAs questionably due to beet juice.  Actively being seen by urology.  Assured she will  HYPERTENSION, BENIGN SYSTEMIC Blood pressure 178/108.  I have again heavily advised patient to start blood pressure medication.  He continues to decline at this time however is amenable to rechecking in 1 month.  Advised her that chronic hypertension is correlated to increased risk of stroke and adverse cardiovascular events.  Chest pain Referral to cardiology for further  work-up.  At this time, denies chest pain, dyspnea on exertion, palpitations.  Given murmur and coronary artery calcifications seen on prior imaging, patient would likely benefit from echo and CT calcium score.   Return in about 1 month (around 03/19/2021) for HTN follow up.   Wells Guiles, DO 02/16/2021, 6:46 PM PGY-1, [place]

## 2021-02-16 ENCOUNTER — Encounter: Payer: Self-pay | Admitting: Student

## 2021-02-16 ENCOUNTER — Ambulatory Visit (INDEPENDENT_AMBULATORY_CARE_PROVIDER_SITE_OTHER): Payer: Medicare Other | Admitting: Student

## 2021-02-16 ENCOUNTER — Other Ambulatory Visit: Payer: Self-pay

## 2021-02-16 VITALS — BP 178/108 | HR 104 | Ht 65.0 in | Wt 190.1 lb

## 2021-02-16 DIAGNOSIS — R319 Hematuria, unspecified: Secondary | ICD-10-CM

## 2021-02-16 DIAGNOSIS — I10 Essential (primary) hypertension: Secondary | ICD-10-CM

## 2021-02-16 DIAGNOSIS — E010 Iodine-deficiency related diffuse (endemic) goiter: Secondary | ICD-10-CM | POA: Diagnosis not present

## 2021-02-16 DIAGNOSIS — R079 Chest pain, unspecified: Secondary | ICD-10-CM

## 2021-02-16 HISTORY — DX: Hematuria, unspecified: R31.9

## 2021-02-16 NOTE — Assessment & Plan Note (Signed)
Thyromegaly appreciated on CTA for PE during ED visit.  Ordered TSH and thyroid ultrasound.

## 2021-02-16 NOTE — Assessment & Plan Note (Signed)
Blood pressure 178/108.  I have again heavily advised patient to start blood pressure medication.  He continues to decline at this time however is amenable to rechecking in 1 month.  Advised her that chronic hypertension is correlated to increased risk of stroke and adverse cardiovascular events.

## 2021-02-16 NOTE — Patient Instructions (Signed)
It was great to see you today! Thank you for choosing [facility] for your primary care. Yvette Bell was seen for ED follow-up for chest pain, hypertension.  Today we addressed: Chest pain, hypertension: I would strongly urge you to consider a blood pressure medication in the future as increased blood pressure chronically can lead to stroke and cardiovascular risks such as heart attack.  I have submitted a referral to cardiology for further chest pain work-up. Thyroid: We are checking your thyroid levels and I have placed an order for a thyroid ultrasound and your thyroid showed to be enlarged and you received imaging during your ED visit Hematuria: I am glad you were seen by urology in September for this.  Please give them a call as you were advised to follow-up in 3 months.   Orders Placed This Encounter  Procedures   US THYROID    Standing Status:   Future    Standing Expiration Date:   02/15/2022    Order Specific Question:   Reason for Exam (SYMPTOM  OR DIAGNOSIS REQUIRED)    Answer:   Diffusely enlarged thryoid seen on previous imaging    Order Specific Question:   Preferred imaging location?    Answer:   [facility]   Ambulatory referral to Cardiology    Referral Priority:   Routine    Referral Type:   Consultation    Referral Reason:   Specialty Services Required    Requested Specialty:   Cardiology    Number of Visits Requested:   66   We are checking some labs today. If they are abnormal, I will call you. If they are normal, I will send you a MyChart message (if it is active) or a letter in the mail. If you do not hear about your labs in the next 2 weeks, please call the office.  You should return to our clinic Return in about 1 month (around 03/19/2021) for HTN follow up..  I recommend that you always bring your medications to each appointment as this makes it easy to ensure you are on the correct medications and helps [place] not miss refills when you need  them.  Please arrive 15 minutes before your appointment to ensure smooth check in process.  We appreciate your efforts in making this happen.  Take care and seek immediate care sooner if you develop any concerns.   Thank you for allowing me to participate in your care, Wells Guiles, DO 02/16/2021, 3:07 PM PGY-1, [place]

## 2021-02-16 NOTE — Assessment & Plan Note (Addendum)
Referral to cardiology for further work-up.  At this time, denies chest pain, dyspnea on exertion, palpitations.  Given murmur and coronary artery calcifications seen on prior imaging, patient would likely benefit from echo and CT calcium score.

## 2021-02-16 NOTE — Assessment & Plan Note (Signed)
Noted upon prior UAs questionably due to beet juice.  Actively being seen by urology.  Assured she will

## 2021-02-17 LAB — TSH: TSH: 0.893 u[IU]/mL (ref 0.450–4.500)

## 2021-02-23 ENCOUNTER — Telehealth: Payer: Self-pay

## 2021-02-23 NOTE — Progress Notes (Deleted)
° ° °  SUBJECTIVE:   CHIEF COMPLAINT / HPI:   Cough, congestion, sputum: Patient called into our nurse line yesterday with complaints of excessive coughing, greenish sputum.  She denies any fever/chills/shortness of breath.  Today she states***.  PERTINENT  PMH / PSH: Mild intermittent asthma.  OBJECTIVE:   LMP 08/20/2011    General: NAD, pleasant, able to participate in exam HEENT: No pharyngeal erythema,*** Cardiac: RRR, no murmurs. Respiratory: CTAB, normal effort, No wheezes, rales or rhonchi Abdomen: Bowel sounds present, nontender Skin: warm and dry, no rashes noted  ASSESSMENT/PLAN:   No problem-specific Assessment & Plan notes found for this encounter.    Assessment: 66 y.o. female with symptoms consistent with a viral upper respiratory tract infection.  Patient does not have any concerning symptoms.  No shortness of breath***.  Overall differential can include seasonal allergies with postnasal drip leading to the cough and runny nose versus viral URI which can include common cold, influenza, COVID-19, or number of other respiratory viruses.  Unlikely bacterial at this time though the patient could potentially develop sinus infection due to congestion. Plan: -We will send for COVID-19 testing -Discussed return precautions -Discussed symptomatic treatment and the lack of need for antibiotics.  Lurline Del, [place]

## 2021-02-23 NOTE — Telephone Encounter (Signed)
Patient calls nurse line reporting a productive cough and congestion since Monday. Patient reports "excessive" coughing with greenish yellowish sputum. Patient reports she took a covid test and negative. Patient denies any fever/chills or SOB. Patient reports she did have to use her nebulizer machine yesterday and that helped.   Patient reports she is going to try Mucinex OTC and see if symptoms improve. Patient scheduled for tomorrow for evaluation. Patient to call tomorrow morning to cancel if Mucinex helps.  Red flags discussed with patient.

## 2021-02-24 ENCOUNTER — Encounter: Payer: Self-pay | Admitting: Family Medicine

## 2021-02-24 ENCOUNTER — Ambulatory Visit: Payer: Medicare Other

## 2021-02-24 ENCOUNTER — Other Ambulatory Visit: Payer: Self-pay

## 2021-02-24 ENCOUNTER — Ambulatory Visit (INDEPENDENT_AMBULATORY_CARE_PROVIDER_SITE_OTHER): Payer: Medicare Other | Admitting: Family Medicine

## 2021-02-24 VITALS — BP 178/104 | HR 97 | Ht 65.0 in | Wt 187.0 lb

## 2021-02-24 DIAGNOSIS — I10 Essential (primary) hypertension: Secondary | ICD-10-CM

## 2021-02-24 DIAGNOSIS — R051 Acute cough: Secondary | ICD-10-CM | POA: Diagnosis not present

## 2021-02-24 MED ORDER — AMLODIPINE BESYLATE 5 MG PO TABS: 5.0000 mg | ORAL_TABLET | Freq: Every day | ORAL | 3 refills | Status: DC

## 2021-02-24 NOTE — Patient Instructions (Signed)
We are starting a blood pressure medicine today called amlodipine.  We are starting it at 5 mg which is a small dose.  I will see you back in about a week to recheck your blood pressure and we may increase it to 10 mg at that time.  For your cough and congestion I expect this is due to a viral infection.  We do not need to do any imaging at this time.  Since you have a negative COVID test already I do not think we need to recheck this especially since she just had COVID about 4-6 weeks ago.  I will give you a work note keeping you out until Monday.  Follow-up with me if you have any worsening symptoms or if they do not continue to resolve.

## 2021-02-24 NOTE — Progress Notes (Signed)
° ° °  SUBJECTIVE:   CHIEF COMPLAINT / HPI:   Cough and congestion: Started Monday. No fevers, vomiting, diarrhea. No sick contacts. She works as a Presenter, broadcasting. No myalgias or joint aches. She had Covid about a month or 6 weeks ago.   PERTINENT  PMH / PSH: HTN  OBJECTIVE:   BP (!) 178/104    Pulse 97    Ht 5\' 5"  (1.651 m)    Wt 187 lb (84.8 kg)    LMP 08/20/2011    SpO2 100%    BMI 31.12 kg/m    General: NAD, pleasant, able to participate in exam Cardiac: RRR, no murmurs. Respiratory: CTAB, normal effort, no wheezing/rhonchi/rales Abdomen: Bowel sounds present, nontender Skin: warm and dry, no rashes noted  ASSESSMENT/PLAN:   Hypertension: Blood pressure today 178/104, looking back she has had elevated blood pressures at almost every visit since early 2022.  She previously tried hydrochlorothiazide but did not tolerate it.  Discussed amlodipine and she is interested in starting this.  We will start 5 mg and follow-up in 1 week.  She is going check her pressures at home.  We will likely increase to 10 mg at 1 week.   Cough/Congestion Assessment: 66 y.o. female with symptoms consistent with a viral upper respiratory tract infection.  She has a negative COVID test at home as well as recently had COVID 4-6 weeks ago.  She is well-appearing with lungs clear to auscultation.  Her symptoms are confined to cough and congestion with no fever, vomiting, diarrhea.  She has no myalgias.  Will not test for COVID-19/influenza at this time as it would not make any difference in her treatment.  She is staying out of work until Monday.  I have low concern for COVID as she has a negative COVID test already and recently had COVID infection about 4-6 weeks ago.   Lurline Del, [place]

## 2021-02-24 NOTE — Assessment & Plan Note (Signed)
Blood pressure today 178/104, looking back she has had elevated blood pressures at almost every visit since early 2022.  She previously tried hydrochlorothiazide but did not tolerate it.  Discussed amlodipine and she is interested in starting this.  We will start 5 mg and follow-up in 1 week.  She is going check her pressures at home.  We will likely increase to 10 mg at 1 week.

## 2021-03-08 NOTE — Progress Notes (Deleted)
Cardiology Office Note:    Date:  03/08/2021   ID:  Yvette Bell, DOB Apr 07, 1955, MRN [id number]  PCP:  Yvette Guiles, DO   [facility] Providers Cardiologist:  None { Click to update primary Bell,subspecialty Bell or APP then REFRESH:1}    Referring Bell: Yvette Malay, Bell   CC: *** Consulted for the evaluation of query of CAD at the behest of Yvette Guiles, DO  History of Present Illness:    Yvette Bell is a 66 y.o. female with a hx of new systolic murmur, CP NOS with December ED visit, with prior COVID-19.  Patient notes that (s)he is feeling ***.  Has had no chest pain, chest pressure, chest tightness, chest stinging ***.  Discomfort occurs with ***, worsens with ***, and improves with ***.  Patient exertion notable for *** with *** and feels no symptoms.  No shortness of breath, DOE ***.  No PND or orthopnea***.  No weight gain***, leg swelling ***, or abdominal swelling***.  No syncope or near syncope ***. Notes *** no palpitations or funny heart beats.     Patient reports prior cardiac testing including ***  No history of ***pre-eclampsia, gestation HTN or gestational DM.  No Fen-Phen or drug use***.  Ambulatory BP ***.   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   Lipoma of torso 03/11/2017   Mass on back 10/25/2015   NECK SPASM 02/02/2010   Qualifier: Diagnosis of  By: Yvette Cooter  Bell, Yvette Bell     Rectal bleeding 06/17/2014   Skin sensitivity 01/22/2015   Thyroid disease    hyperactive   Tremulousness 12/26/2016    UTERINE FIBROID 03/21/2006   Qualifier: Diagnosis of  By: Yvette Bell, [name]      Past Surgical History:  Procedure Laterality Date   cesearean section     CHOLECYSTECTOMY N/A 09/14/2016   Procedure: LAPAROSCOPIC CHOLECYSTECTOMY WITH INTRAOPERATIVE CHOLANGIOGRAM;  Surgeon: Alphonsa Overall, Bell;  Location: [facility];  Service: General;  Laterality: N/A;   HERNIA REPAIR     LIPOMA EXCISION      Current Medications: No outpatient medications have been marked as taking for the 03/10/21 encounter (Appointment) with [name], Bell.     Allergies:   Morphine and Pollen extract   Social History   Socioeconomic History   Marital status: Divorced    Spouse name: Not on file   Number of children: 3   Years of education: 12   Highest education level: High school graduate  Occupational History   Occupation: Disability  Tobacco Use   Smoking status: Never   Smokeless tobacco: Never  Vaping Use   Vaping Use: Never used  Substance and Sexual Activity   Alcohol use: Yes    Alcohol/week: 0.0 standard drinks    Comment: Occassionally   Drug use: No   Sexual activity: Not on file  Other Topics Concern   Not on file  Social History Narrative   Patient lives alone in [name].   Patient has 3 children and 10 grandchildren.    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   Patient has a close circle of friends.    Patient dances and walks daily for exercise.    Social Determinants of Health   Financial Resource Strain: Low Risk    Difficulty of Paying Living Expenses: Not hard at all  Food Insecurity: No Food Insecurity   Worried About Charity fundraiser in the Last Year: Never true   [place] in the Last Year: Never true  Transportation Needs: No Transportation Needs   Lack of Transportation (Medical): No   Lack of Transportation (Non-Medical): No  Physical Activity: Sufficiently Active   Days of Exercise per Week: 7 days   Minutes of  Exercise per Session: 60 min  Stress: Stress Concern Present   Feeling of Stress : To some extent  Social Connections: Moderately Integrated   Frequency of Communication with Friends and Family: More than three times a week   Frequency of Social Gatherings with Friends and Family: More than three times a week   Attends Religious Services: More than 4 times per year   Active Member of [employer] or Organizations: Yes   Attends Music therapist: More than 4 times per year   Marital Status: Divorced     Family History: The patient's ***family history includes Colon polyps in her sister; Diabetes in her sister; Stomach cancer in her father. There is no history of Colon cancer, Esophageal cancer, Gallbladder disease, or Rectal cancer.  ROS:   Please see the history of present illness.    *** All other systems reviewed and are negative.  EKGs/Labs/Other Studies Reviewed:    The following studies were reviewed today: ***  EKG:  EKG is *** ordered today.  The ekg ordered today demonstrates ***  Recent Labs: 12/31/2020: ALT 21; BUN 11; Creatinine, Ser 0.66; Hemoglobin 14.3; Magnesium 2.0; Platelets 245; Potassium 3.6; Sodium 139 02/16/2021: TSH 0.893  Recent Lipid Panel    Component Value Date/Time   CHOL 188 03/24/2019 1213   TRIG 67 03/24/2019 1213   HDL 65 03/24/2019 1213   CHOLHDL 2.9 03/24/2019 1213   CHOLHDL 3.1 07/07/2013 1126   VLDL 19 07/07/2013 1126   LDLCALC 111 (H) 03/24/2019 1213        Physical Exam:    VS:  LMP 08/20/2011     Wt Readings from Last 3 Encounters:  02/24/21 84.8 kg  02/16/21 86.2 kg  09/22/20 84.1 kg     GEN: *** Well nourished, well developed in no acute distress HEENT: Normal NECK: No JVD; No carotid bruits LYMPHATICS: No lymphadenopathy CARDIAC: ***RRR, no murmurs, rubs, gallops RESPIRATORY:  Clear to auscultation without rales, wheezing or rhonchi  ABDOMEN: Soft, non-tender, non-distended MUSCULOSKELETAL:  No edema; No  deformity  SKIN: Warm and dry NEUROLOGIC:  Alert and oriented x 3 PSYCHIATRIC:  Normal affect   ASSESSMENT:    No diagnosis found. PLAN:    Precordial Pain - The patient presents with cardiac/possibly cardiac/non-cardiac pain *** - *** Criteria to defer EKG  stress include without evidence of accessory pathway, ventricular pacing, digoxin use, LBBB, or baseline ST changes.  The 10-year ASCVD risk score (Arnett DK, et al., 2019) is: 16.5%   Values used to calculate the score:     Age: 8 years     Sex: Female  Is Non-Hispanic African American: Yes     Diabetic: No     Tobacco smoker: No     Systolic Blood Pressure: 712 mmHg     Is BP treated: Yes     HDL Cholesterol: 65 mg/dL     Total Cholesterol: 188 mg/dL  - Additional Blood Work:  Lipids ***  - Continue ASA 81 mg QD, current statin, and beta blocker therapies ***  - Sublingual nitroglycerin as need for chest pain. *** - Would recommend an echocardiogram to assess LVEF and exclude WMA.   - Would recommend CCTA with possible FFR as needed to exclude obstructive CAD and to assess for non-obstructive CAD requiring secondary prevention - BMI in *** so high tube current may be necessary, *** no hx of AF ivabradine - resting heart rate is ***50-60 bpm, given BP room with ddd Metoprolol 25 mg PO 90 min prior to scan - resting heart rate is ***60-65 bpm, given BP room with add Metoprolol  50 mg PO 90 min prior to scan - resting heart rate is ***65-80 bpm, given BP room with add Metoprolol  100 mg PO 90 min prior to scan - - resting heart rate is ***> 80bpm, given BP room with  add ivabradine 15 mg PO 120 min prior to scan   - GFR is *** necessitating contrast limit of ***  - Would recommend exercise/pharmacological ***nuclear medicine stress test (NPO at midnight/hold beta blocker in AM); discussed risks, benefits, and alternatives of the diagnostic procedure including chest pain, arrhythmia, and death.  Patient amenable for  testing.       {Are you ordering a CV Procedure (e.g. stress test, cath, DCCV, TEE, etc)?   Press F2        :[id number]}    Medication Adjustments/Labs and Tests Ordered: Current medicines are reviewed at length with the patient today.  Concerns regarding medicines are outlined above.  No orders of the defined types were placed in this encounter.  No orders of the defined types were placed in this encounter.   There are no Patient Instructions on file for this visit.   Signed, [name], Bell  03/08/2021 1:20 PM    [place]

## 2021-03-10 ENCOUNTER — Ambulatory Visit: Payer: Medicare Other | Admitting: Internal Medicine

## 2021-03-23 ENCOUNTER — Emergency Department (HOSPITAL_COMMUNITY)
Admission: EM | Admit: 2021-03-23 | Discharge: 2021-03-24 | Disposition: A | Discharge status: Home / Self Care | Payer: Medicare Other | Attending: Emergency Medicine | Admitting: Emergency Medicine

## 2021-03-23 ENCOUNTER — Emergency Department (HOSPITAL_COMMUNITY): Payer: Medicare Other

## 2021-03-23 ENCOUNTER — Other Ambulatory Visit: Payer: Self-pay

## 2021-03-23 DIAGNOSIS — R0789 Other chest pain: Secondary | ICD-10-CM | POA: Diagnosis not present

## 2021-03-23 DIAGNOSIS — R079 Chest pain, unspecified: Secondary | ICD-10-CM | POA: Diagnosis present

## 2021-03-23 LAB — BASIC METABOLIC PANEL
Anion gap: 10 (ref 5–15)
BUN: 10 mg/dL (ref 8–23)
CO2: 25 mmol/L (ref 22–32)
Calcium: 9.7 mg/dL (ref 8.9–10.3)
Chloride: 106 mmol/L (ref 98–111)
Creatinine, Ser: 0.69 mg/dL (ref 0.44–1.00)
GFR, Estimated: >60 mL/min (ref >60)
Glucose, Bld: 102 mg/dL — ABNORMAL HIGH (ref 70–99)
Potassium: 3.7 mmol/L (ref 3.5–5.1)
Sodium: 141 mmol/L (ref 135–145)

## 2021-03-23 LAB — TROPONIN I (HIGH SENSITIVITY)
Troponin I (High Sensitivity): 3 ng/L (ref <18)
Troponin I (High Sensitivity): 3 ng/L (ref <18)

## 2021-03-23 LAB — CBC
HCT: 44.7 % (ref 36.0–46.0)
Hemoglobin: 14.5 g/dL (ref 12.0–15.0)
MCH: 29.5 pg (ref 26.0–34.0)
MCHC: 32.4 g/dL (ref 30.0–36.0)
MCV: 91 fL (ref 80.0–100.0)
Platelets: 245 10*3/uL (ref 150–400)
RBC: 4.91 MIL/uL (ref 3.87–5.11)
RDW: 13.8 % (ref 11.5–15.5)
WBC: 12.5 10*3/uL — ABNORMAL HIGH (ref 4.0–10.5)
nRBC: 0 % (ref 0.0–0.2)

## 2021-03-23 MED ORDER — KETOROLAC TROMETHAMINE 30 MG/ML IJ SOLN
15.0000 mg | Freq: Once | INTRAMUSCULAR | Status: AC
  Administered 2021-03-23: 15 mg via INTRAVENOUS
  Filled 2021-03-23: qty 1

## 2021-03-23 NOTE — ED Provider Triage Note (Signed)
Emergency Medicine Provider Triage Evaluation Note ? ?Yvette Bell , a 66 y.o. female  was evaluated in triage.  Pt complains of intermittent sharp chest pains.  Episodes last no longer than a few minutes at a time, 7x today.  Pain reproducible with palpation. ? ?Review of Systems  ?Positive: Chest pains ?Negative: SOB ? ?Physical Exam  ?BP (!) 165/97 (BP Location: Right Arm)   Pulse 90   Temp 98.3 ?F (36.8 ?C) (Oral)   Resp 16   LMP 08/20/2011   SpO2 97%  ?Gen:   Awake, no distress   ?Resp:  Normal effort, CTAB ?MSK:   Moves extremities without difficulty  ?Other:  Reproducible sternal chest pain ? ?Medical Decision Making  ?Medically screening exam initiated at 4:53 PM.  Appropriate orders placed.  Yvette Bell was informed that the remainder of the evaluation will be completed by another provider, this initial triage assessment does not replace that evaluation, and the importance of remaining in the ED until their evaluation is complete. ? ?Labs, EKG, imaging ordered ?  ?Prince Rome, PA-C ?34/19/62 1705 ? ?

## 2021-03-23 NOTE — ED Provider Notes (Signed)
?[place] ?Provider Note ? ? ?CSN: [id number] ?Arrival date & time: 03/23/21  1601 ? ?  ? ?History ? ?Chief Complaint  ?Patient presents with  ? Chest Pain  ? ? ?Yvette Bell is a 66 y.o. female. ? ?Patient presents to the emergency department for evaluation of chest pain.  Patient reports that she had onset of sharp pains in the left upper chest at approximately 3:30 PM.  Pain waxes and wanes.  She has not identified anything that worsens it other than touching the area.  She is not experiencing shortness of breath, nausea or diaphoresis.  Patient reports that she has had similar pains before and was told that it was chest wall pain.  She thinks that these episodes are brought on by stress. ? ? ?  ? ?Home Medications ?Prior to Admission medications   ?Medication Sig Start Date End Date Taking? Authorizing Provider  ?amLODipine (NORVASC) 5 MG tablet Take 1 tablet (5 mg total) by mouth at bedtime. 02/24/21  Yes Welborn, Ryan, DO  ?EPINEPHrine 0.3 mg/0.3 mL IJ SOAJ injection Inject 0.3 mg into the muscle as needed for anaphylaxis.  07/31/16  Yes [provider]  ?meloxicam (MOBIC) 7.5 MG tablet Take 1 tablet (7.5 mg total) by mouth daily as needed for pain (chest wall pain). 03/24/21  Yes Shaia Porath, Gwenyth Allegra, MD  ?VENTOLIN HFA 108 (90 Base) MCG/ACT inhaler INHALE 1 TO 2 PUFFS INTO THE LUNGS EVERY 6 HOURS AS NEEDED FOR WHEEZING OR SHORTNESS OF BREATH ?Patient taking differently: Inhale 1-2 puffs into the lungs every 6 (six) hours as needed for shortness of breath or wheezing. 06/08/20  Yes Benay Pike, MD  ?   ? ?Allergies    ?Morphine and Pollen extract   ? ?Review of Systems   ?Review of Systems  ?Cardiovascular:  Positive for chest pain.  ? ?Physical Exam ?Updated Vital Signs ?BP 108/73   Pulse 65   Temp 98.6 ?F (37 ?C) (Oral)   Resp 17   Ht 5\' 5"  (1.651 m)   Wt 91 kg   LMP 08/20/2011   SpO2 96%   BMI 33.38 kg/m?  ?Physical Exam ?Vitals and nursing note  reviewed.  ?Constitutional:   ?   General: She is not in acute distress. ?   Appearance: She is well-developed.  ?HENT:  ?   Head: Normocephalic and atraumatic.  ?   Mouth/Throat:  ?   Mouth: Mucous membranes are moist.  ?Eyes:  ?   General: Vision grossly intact. Gaze aligned appropriately.  ?   Extraocular Movements: Extraocular movements intact.  ?   Conjunctiva/sclera: Conjunctivae normal.  ?Cardiovascular:  ?   Rate and Rhythm: Normal rate and regular rhythm.  ?   Pulses: Normal pulses.  ?   Heart sounds: Normal heart sounds, S1 normal and S2 normal. No murmur heard. ?  No friction rub. No gallop.  ?Pulmonary:  ?   Effort: Pulmonary effort is normal. No respiratory distress.  ?   Breath sounds: Normal breath sounds.  ?Chest:  ?   Chest wall: Tenderness present.  ? ? ?Abdominal:  ?   General: Bowel sounds are normal.  ?   Palpations: Abdomen is soft.  ?   Tenderness: There is no abdominal tenderness. There is no guarding or rebound.  ?   Hernia: No hernia is present.  ?Musculoskeletal:     ?   General: No swelling.  ?   Cervical back: Full passive range of motion  without pain, normal range of motion and neck supple. No spinous process tenderness or muscular tenderness. Normal range of motion.  ?   Right lower leg: No edema.  ?   Left lower leg: No edema.  ?Skin: ?   General: Skin is warm and dry.  ?   Capillary Refill: Capillary refill takes less than 2 seconds.  ?   Findings: No ecchymosis, erythema, rash or wound.  ?Neurological:  ?   General: No focal deficit present.  ?   Mental Status: She is alert and oriented to person, place, and time.  ?   GCS: GCS eye subscore is 4. GCS verbal subscore is 5. GCS motor subscore is 6.  ?   Cranial Nerves: Cranial nerves 2-12 are intact.  ?   Sensory: Sensation is intact.  ?   Motor: Motor function is intact.  ?   Coordination: Coordination is intact.  ?Psychiatric:     ?   Attention and Perception: Attention normal.     ?   Mood and Affect: Mood normal.     ?   Speech:  Speech normal.     ?   Behavior: Behavior normal.  ? ? ?ED Results / Procedures / Treatments   ?Labs ?(all labs ordered are listed, but only abnormal results are displayed) ?Labs Reviewed  ?BASIC METABOLIC PANEL - Abnormal; Notable for the following components:  ?    Result Value  ? Glucose, Bld 102 (*)   ? All other components within normal limits  ?CBC - Abnormal; Notable for the following components:  ? WBC 12.5 (*)   ? All other components within normal limits  ?TROPONIN I (HIGH SENSITIVITY)  ?TROPONIN I (HIGH SENSITIVITY)  ? ? ?EKG ?EKG Interpretation ? ?Date/Time:  Thursday March 23 2021 15:58:14 EST ?Ventricular Rate:  85 ?PR Interval:  158 ?QRS Duration: 72 ?QT Interval:  366 ?QTC Calculation: 435 ?R Axis:   63 ?Text Interpretation: Normal sinus rhythm Normal ECG No significant change since last tracing Confirmed by Deno Etienne [phone]) on 03/23/2021 10:54:20 PM ? ?Radiology ?DG Chest Port 1 View ? ?Result Date: 03/23/2021 ?CLINICAL DATA:  Chest pain EXAM: PORTABLE CHEST 1 VIEW COMPARISON:  12/31/2020 FINDINGS: The heart size and mediastinal contours are within normal limits. Both lungs are clear. The visualized skeletal structures are unremarkable. IMPRESSION: No active disease. Electronically Signed   By: Ulyses Jarred M.D.   On: 03/23/2021 22:51   ? ?Procedures ?Procedures  ? ? ?Medications Ordered in ED ?Medications  ?ketorolac (TORADOL) 30 MG/ML injection 15 mg (15 mg Intravenous Given 03/23/21 2342)  ? ? ?ED Course/ Medical Decision Making/ A&P ?  ?                        ?Medical Decision Making ?Amount and/or Complexity of Data Reviewed ?Labs: ordered. ?Radiology: ordered and independent interpretation performed. Decision-making details documented in ED Course. ?ECG/medicine tests: ordered and independent interpretation performed. Decision-making details documented in ED Course. ? ?Risk ?Prescription drug management. ? ? ?Presents to the emergency department for evaluation of chest pain. ? ?Frontal diagnosis  considered is anginal chest pain, acute coronary syndrome, chest wall pain, GI chest pain, pulmonary chest pain. ? ?Patient reports that she has had similar symptoms in the past and it has been chest wall in nature.  Current presentation reports this diagnosis as she does have reproducible pain with palpation.  Symptoms are otherwise atypical in nature for cardiac etiology.  EKG, troponin  x2 unremarkable. ? ?Patient still had pain at time of my evaluation.  This completely resolved after low-dose Toradol.  This further supports the diagnosis of chest wall pain.  Will discharge, follow-up with primary care. ? ? ? ? ? ? ? ?Final Clinical Impression(s) / ED Diagnoses ?Final diagnoses:  ?Chest wall pain  ? ? ?Rx / DC Orders ?ED Discharge Orders   ? ?      Ordered  ?  meloxicam (MOBIC) 7.5 MG tablet  Daily PRN       ? 03/24/21 0304  ? ?  ?  ? ?  ? ? ?  ?Orpah Greek, MD ?03/24/21 [phone] ? ?

## 2021-03-23 NOTE — ED Triage Notes (Signed)
Pt from home via EMS for eval of intermittent L sided non radiating sharp chest pain onset 30 mins ago. 324 ASA and 1 nitro given by EMS with some relief.  ?

## 2021-03-24 MED ORDER — MELOXICAM 7.5 MG PO TABS: 7.5000 mg | ORAL_TABLET | Freq: Every day | ORAL | 0 refills | Status: DC | PRN

## 2021-03-31 ENCOUNTER — Encounter: Payer: Self-pay | Admitting: Cardiovascular Disease

## 2021-03-31 ENCOUNTER — Other Ambulatory Visit: Payer: Self-pay

## 2021-03-31 ENCOUNTER — Ambulatory Visit (INDEPENDENT_AMBULATORY_CARE_PROVIDER_SITE_OTHER): Payer: Medicare Other | Admitting: Cardiovascular Disease

## 2021-03-31 VITALS — BP 122/68 | HR 98 | Ht 65.0 in | Wt 186.0 lb

## 2021-03-31 DIAGNOSIS — R079 Chest pain, unspecified: Secondary | ICD-10-CM | POA: Diagnosis not present

## 2021-03-31 DIAGNOSIS — I10 Essential (primary) hypertension: Secondary | ICD-10-CM

## 2021-03-31 NOTE — Patient Instructions (Signed)
Medication Instructions:  ?Your physician recommends that you continue on your current medications as directed. Please refer to the Current Medication list given to you today. ? ?*If you need a refill on your cardiac medications before your next appointment, please call your pharmacy* ? ? ?Lab Work: ?NONE ?If you have labs (blood work) drawn today and your tests are completely normal, you will receive your results only by: ?MyChart Message (if you have MyChart) OR ?A paper copy in the mail ?If you have any lab test that is abnormal or we need to change your treatment, we will call you to review the results. ? ? ?Testing/Procedures: ?Your physician has requested that you have en exercise stress myoview. For further information please visit [email or web address]. Please follow instruction sheet, as given.  ? ?Follow-Up: ?At [facility], you and your health needs are our priority.  As part of our continuing mission to provide you with exceptional heart care, we have created designated Provider Care Teams.  These Care Teams include your primary Cardiologist (physician) and Advanced Practice Providers (APPs -  Physician Assistants and Nurse Practitioners) who all work together to provide you with the care you need, when you need it. ? ?Your next appointment:   ?2-3 month(s) ? ?The format for your next appointment:   ?In Person ? ?Provider:   ?Mertie Moores, MD ?

## 2021-03-31 NOTE — Progress Notes (Signed)
Cardiology Office Note:    Date:  03/31/2021   ID:  Yvette Bell, DOB 1955-10-03, MRN [id number]  PCP:  Wells Guiles, DO   [facility] Providers Cardiologist:  Acie Fredrickson   Referring MD: Martyn Malay, MD   Chief Complaint  Patient presents with   Chest Pain    History of Present Illness:    Yvette Bell is a 66 y.o. female with a hx of chest pain , HTN, bronchospasm with URI  We were asked to see her by Dr. . 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  Has not done a treadmill in years, makes her dizzy .   She went to the emergency room last week for the same chest pains.  Her EKG revealed normal sinus rhythm.  No ST or T wave changes.  Troponins were negative x2.  Her white blood cell count was mildly elevated at 12.5.  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   Lipoma of torso 03/11/2017   Mass on back 10/25/2015   NECK SPASM 02/02/2010   Qualifier: Diagnosis of  By: [name]  MD, [name]     Rectal bleeding 06/17/2014   Skin sensitivity 01/22/2015   Thyroid disease    hyperactive   Tremulousness 12/26/2016   UTERINE FIBROID 03/21/2006   Qualifier: Diagnosis of  By: [name], [name]      Past Surgical History:  Procedure Laterality Date   cesearean section     CHOLECYSTECTOMY N/A 09/14/2016    Procedure: LAPAROSCOPIC CHOLECYSTECTOMY WITH INTRAOPERATIVE CHOLANGIOGRAM;  Surgeon: Alphonsa Overall, MD;  Location: [facility];  Service: General;  Laterality: N/A;   HERNIA REPAIR     LIPOMA EXCISION      Current Medications: Current Meds  Medication Sig   amLODipine (NORVASC) 5 MG tablet Take 1 tablet (5 mg total) by mouth at bedtime.   EPINEPHrine 0.3 mg/0.3 mL IJ SOAJ injection Inject 0.3 mg into the muscle as needed for anaphylaxis.    meloxicam (MOBIC) 7.5 MG tablet Take 1 tablet (7.5 mg total) by mouth daily as needed for pain (chest wall pain).   VENTOLIN HFA 108 (90 Base) MCG/ACT inhaler INHALE 1 TO 2 PUFFS INTO THE LUNGS EVERY 6 HOURS AS NEEDED FOR WHEEZING OR SHORTNESS OF BREATH (Patient taking differently: Inhale 1-2 puffs into the lungs every 6 (six) hours as needed for shortness of breath or wheezing.)     Allergies:   Morphine and Pollen extract   Social History   Socioeconomic History   Marital status: Divorced    Spouse name: Not on file   Number of children: 3   Years  of education: 12   Highest education level: High school graduate  Occupational History   Occupation: Disability  Tobacco Use   Smoking status: Never   Smokeless tobacco: Never  Vaping Use   Vaping Use: Never used  Substance and Sexual Activity   Alcohol use: Yes    Alcohol/week: 0.0 standard drinks    Comment: Occassionally   Drug use: No   Sexual activity: Not on file  Other Topics Concern   Not on file  Social History Narrative   Patient lives alone in [place].   Patient has 3 children and 10 grandchildren.    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   Patient has a close circle of friends.    Patient dances and walks daily for exercise.    Social Determinants of Health   Financial Resource Strain: Low Risk    Difficulty of Paying Living Expenses: Not hard at all  Food Insecurity: No Food Insecurity   Worried About Charity fundraiser in the Last  Year: Never true   [place] in the Last Year: Never true  Transportation Needs: No Transportation Needs   Lack of Transportation (Medical): No   Lack of Transportation (Non-Medical): No  Physical Activity: Sufficiently Active   Days of Exercise per Week: 7 days   Minutes of Exercise per Session: 60 min  Stress: Stress Concern Present   Feeling of Stress : To some extent  Social Connections: Moderately Integrated   Frequency of Communication with Friends and Family: More than three times a week   Frequency of Social Gatherings with Friends and Family: More than three times a week   Attends Religious Services: More than 4 times per year   Active Member of [employer] or Organizations: Yes   Attends Music therapist: More than 4 times per year   Marital Status: Divorced     Family History: The patient's family history includes Colon polyps in her sister; Diabetes in her sister; Stomach cancer in her father. There is no history of Colon cancer, Esophageal cancer, Gallbladder disease, or Rectal cancer.  ROS:   Please see the history of present illness.     All other systems reviewed and are negative.  EKGs/Labs/Other Studies Reviewed:    The following studies were reviewed today:   EKG:    Recent Labs: 12/31/2020: ALT 21; Magnesium 2.0 02/16/2021: TSH 0.893 03/23/2021: BUN 10; Creatinine, Ser 0.69; Hemoglobin 14.5; Platelets 245; Potassium 3.7; Sodium 141  Recent Lipid Panel    Component Value Date/Time   CHOL 188 03/24/2019 1213   TRIG 67 03/24/2019 1213   HDL 65 03/24/2019 1213   CHOLHDL 2.9 03/24/2019 1213   CHOLHDL 3.1 07/07/2013 1126   VLDL 19 07/07/2013 1126   LDLCALC 111 (H) 03/24/2019 1213     Risk Assessment/Calculations:          Physical Exam:    VS:  BP 122/68    Pulse 98    Ht '5\' 5"'$  (1.651 m)    Wt 186 lb (84.4 kg)    LMP 08/20/2011    SpO2 99%    BMI 30.95 kg/m     Wt Readings from Last 3 Encounters:  03/31/21 186 lb (84.4 kg)   03/23/21 200 lb 9.9 oz (91 kg)  02/24/21 187 lb (84.8 kg)     GEN:  Well nourished, well developed in no acute distress HEENT: Normal NECK: No JVD; No carotid bruits LYMPHATICS:  No lymphadenopathy CARDIAC: RRR, no murmurs, rubs, gallops RESPIRATORY:  Clear to auscultation without rales, wheezing or rhonchi  ABDOMEN: Soft, non-tender, non-distended MUSCULOSKELETAL:  No edema; No deformity  SKIN: Warm and dry NEUROLOGIC:  Alert and oriented x 3 PSYCHIATRIC:  Normal affect   ASSESSMENT:    1. HYPERTENSION, BENIGN SYSTEMIC   2. Chest pain, unspecified type    PLAN:       Chest pressure :  Yvette Bell presents with episodes of some chest discomfort.  This is described as a chest pressure.  It occurs at various times and might be exertionally related laded.  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 For further evaluation I would like to do a ETT Myoview study.  We will plan on seeing her again in several months for a follow-up visit.  2.  Hypertension: Her blood pressure is well controlled now.  Her heart rate is a little fast.  This might be partially because of her amlodipine therapy.  She may do diet better without low-dose beta-blocker and lower dose amlodipine but she does not want to try the beta-blocker at this point      Shared Decision Making/Informed Consent{   The risks [chest pain, shortness of breath, cardiac arrhythmias, dizziness, blood pressure fluctuations, myocardial infarction, stroke/transient ischemic attack, nausea, vomiting, allergic reaction, radiation exposure, metallic taste sensation and life-threatening complications (estimated to be 1 in 10,000)], benefits (risk stratification, diagnosing coronary artery disease, treatment guidance) and alternatives of a nuclear stress test were discussed in detail with Yvette Bell and she agrees to proceed.    Medication Adjustments/Labs and Tests Ordered: Current medicines are reviewed  at length with the patient today.  Concerns regarding medicines are outlined above.  Orders Placed This Encounter  Procedures   MYOCARDIAL PERFUSION IMAGING   No orders of the defined types were placed in this encounter.   Patient Instructions  Medication Instructions:  Your physician recommends that you continue on your current medications as directed. Please refer to the Current Medication list given to you today.  *If you need a refill on your cardiac medications before your next appointment, please call your pharmacy*   Lab Work: NONE If you have labs (blood work) drawn today and your tests are completely normal, you will receive your results only by: [place] (if you have MyChart) OR A paper copy in the mail If you have any lab test that is abnormal or we need to change your treatment, we will call you to review the results.   Testing/Procedures: Your physician has requested that you have en exercise stress myoview. For further information please visit [email or web address]. Please follow instruction sheet, as given.   Follow-Up: At [facility], you and your health needs are our priority.  As part of our continuing mission to provide you with exceptional heart care, we have created designated Provider Care Teams.  These Care Teams include your primary Cardiologist (physician) and Advanced Practice Providers (APPs -  Physician Assistants and Nurse Practitioners) who all work together to provide you with the care you need, when you need it.  Your next appointment:   2-3 month(s)  The format for your next appointment:   In Person  Provider:   Mertie Moores, MD   Signed, Mertie Moores, MD  03/31/2021 5:52 PM    [place]

## 2021-04-06 ENCOUNTER — Telehealth (HOSPITAL_COMMUNITY): Payer: Self-pay

## 2021-04-06 NOTE — Telephone Encounter (Signed)
Detailed instructions left on the patient's answering machine. Asked to call back with any questions. S.Nuchem Grattan EMTP 

## 2021-04-11 ENCOUNTER — Encounter: Payer: Self-pay | Admitting: Student

## 2021-04-11 ENCOUNTER — Encounter (HOSPITAL_COMMUNITY): Payer: Medicare Other

## 2021-04-11 ENCOUNTER — Telehealth (HOSPITAL_COMMUNITY): Payer: Self-pay | Admitting: Cardiovascular Disease

## 2021-04-11 NOTE — Telephone Encounter (Signed)
Patient  called and cancelled Myoview for reason below: ? ?04/11/2021 9:05 AM QP:RFFMB, [name]  ?Cancel Rsn: Patient (Patient hurt back, will call back to reschedule) ? ?Order will be removed from the active [place] and if patient calls back we will reinstate order. ? ?Thank you ?

## 2021-04-12 ENCOUNTER — Other Ambulatory Visit (HOSPITAL_COMMUNITY): Payer: Self-pay | Admitting: Cardiovascular Disease

## 2021-04-12 DIAGNOSIS — R079 Chest pain, unspecified: Secondary | ICD-10-CM

## 2021-04-18 ENCOUNTER — Telehealth (HOSPITAL_COMMUNITY): Payer: Self-pay | Admitting: *Deleted

## 2021-04-18 ENCOUNTER — Encounter (HOSPITAL_COMMUNITY): Payer: Self-pay | Admitting: *Deleted

## 2021-04-18 NOTE — Telephone Encounter (Signed)
Left message on voicemail in reference to upcoming appointment scheduled for 04/24/21 Phone number given for a call back so details instructions can be given.  Letter with instructions sent via mychart.  Yvette Bell ? ? ?

## 2021-04-24 ENCOUNTER — Ambulatory Visit (HOSPITAL_COMMUNITY): Payer: Medicare Other | Attending: Cardiology

## 2021-04-24 DIAGNOSIS — R079 Chest pain, unspecified: Secondary | ICD-10-CM | POA: Insufficient documentation

## 2021-04-24 LAB — MYOCARDIAL PERFUSION IMAGING
Base ST Depression (mm): 0 mm
Estimated workload: 7
Exercise duration (min): 5 min
Exercise duration (sec): 46 s
LV dias vol: 75 mL (ref 46–106)
LV sys vol: 28 mL
MPHR: 157 {beats}/min
Nuc Stress EF: 63 %
Peak HR: 160 {beats}/min
Percent HR: 101 %
RPE: 18
Rest HR: 75 {beats}/min
Rest Nuclear Isotope Dose: 10.4 mCi
SDS: 4
SRS: 1
SSS: 5
ST Depression (mm): 0 mm
Stress Nuclear Isotope Dose: 30.4 mCi
TID: 0.94

## 2021-04-24 MED ORDER — TECHNETIUM TC 99M TETROFOSMIN IV KIT
10.4000 | PACK | Freq: Once | INTRAVENOUS | Status: AC | PRN
  Administered 2021-04-24: 10.4 via INTRAVENOUS
  Filled 2021-04-24: qty 11

## 2021-04-24 MED ORDER — TECHNETIUM TC 99M TETROFOSMIN IV KIT
30.4000 | PACK | Freq: Once | INTRAVENOUS | Status: AC | PRN
  Administered 2021-04-24: 30.4 via INTRAVENOUS
  Filled 2021-04-24: qty 31

## 2021-06-02 ENCOUNTER — Encounter (HOSPITAL_COMMUNITY): Payer: Self-pay

## 2021-06-02 ENCOUNTER — Other Ambulatory Visit: Payer: Self-pay

## 2021-06-02 ENCOUNTER — Emergency Department (HOSPITAL_COMMUNITY)
Admission: EM | Admit: 2021-06-02 | Discharge: 2021-06-02 | Disposition: A | Discharge status: Home / Self Care | Payer: Medicare Other | Attending: Emergency Medicine | Admitting: Emergency Medicine

## 2021-06-02 DIAGNOSIS — I10 Essential (primary) hypertension: Secondary | ICD-10-CM | POA: Insufficient documentation

## 2021-06-02 DIAGNOSIS — L259 Unspecified contact dermatitis, unspecified cause: Secondary | ICD-10-CM | POA: Diagnosis not present

## 2021-06-02 DIAGNOSIS — Z79899 Other long term (current) drug therapy: Secondary | ICD-10-CM | POA: Insufficient documentation

## 2021-06-02 DIAGNOSIS — R21 Rash and other nonspecific skin eruption: Secondary | ICD-10-CM | POA: Diagnosis present

## 2021-06-02 DIAGNOSIS — M25511 Pain in right shoulder: Secondary | ICD-10-CM | POA: Diagnosis not present

## 2021-06-02 MED ORDER — DIPHENHYDRAMINE HCL 25 MG PO CAPS
25.0000 mg | ORAL_CAPSULE | Freq: Once | ORAL | Status: AC
  Administered 2021-06-02: 25 mg via ORAL
  Filled 2021-06-02: qty 1

## 2021-06-02 MED ORDER — ONDANSETRON 4 MG PO TBDP: 4.0000 mg | ORAL_TABLET | Freq: Three times a day (TID) | ORAL | 0 refills | Status: DC | PRN

## 2021-06-02 MED ORDER — ONDANSETRON 4 MG PO TBDP
4.0000 mg | ORAL_TABLET | Freq: Once | ORAL | Status: AC
  Administered 2021-06-02: 4 mg via ORAL
  Filled 2021-06-02: qty 1

## 2021-06-02 NOTE — ED Triage Notes (Signed)
Went to sleep last night did not have a bump. Woke up this morning with a bump on the back of her right bicep. Redness around the one bump. She took her at home epi pen because she was scared.  ?

## 2021-06-02 NOTE — Discharge Instructions (Signed)
I recommend some over-the-counter hydrocortisone cream to your shoulder, Benadryl as needed for itching and discomfort.  Tylenol 1000 mg every 6 hours as needed for pain.  Follow-up with your primary care provider. ? ?I suspect that you did not have a anaphylactic reaction and this instead most likely represents a significant contact dermatitis or local allergic reaction. ?

## 2021-06-02 NOTE — ED Provider Notes (Signed)
?[facility] DEPT ?Provider Note ? ? ?CSN: [id number] ?Arrival date & time: 06/02/21  2751 ? ?  ? ?History ? ?Chief Complaint  ?Patient presents with  ? Allergic Reaction  ? ? ?Yvette Bell is a 66 y.o. female. ? ? ?Allergic Reaction ? ?Patient is a 66 year old female with a past medical history significant for obesity, hypertension, goiter, seasonal allergies, eczema ? ?Patient presents emergency room today after she went to sleep last night and woke up with a small area of rash/bump on her right shoulder.  She states that it was somewhat red and she was concerned that she was having allergic reaction.  She administered epinephrine and called 911.  Seems that she was on the phone with 911 when she administered epinephrine. ? ? ? ?  ? ?Home Medications ?Prior to Admission medications   ?Medication Sig Start Date End Date Taking? Authorizing Provider  ?amLODipine (NORVASC) 5 MG tablet Take 1 tablet (5 mg total) by mouth at bedtime. 02/24/21  Yes Welborn, Ryan, DO  ?EPINEPHrine 0.3 mg/0.3 mL IJ SOAJ injection Inject 0.3 mg into the muscle as needed for anaphylaxis.  07/31/16  Yes [provider]  ?VENTOLIN HFA 108 (90 Base) MCG/ACT inhaler INHALE 1 TO 2 PUFFS INTO THE LUNGS EVERY 6 HOURS AS NEEDED FOR WHEEZING OR SHORTNESS OF BREATH ?Patient taking differently: Inhale 1-2 puffs into the lungs every 6 (six) hours as needed for shortness of breath or wheezing. 06/08/20  Yes Benay Pike, MD  ?meloxicam (MOBIC) 7.5 MG tablet Take 1 tablet (7.5 mg total) by mouth daily as needed for pain (chest wall pain). ?Patient not taking: Reported on 06/02/2021 03/24/21   Orpah Greek, MD  ?   ? ?Allergies    ?Morphine and Pollen extract   ? ?Review of Systems   ?Review of Systems ? ?Physical Exam ?Updated Vital Signs ?BP 112/66   Pulse 100   Temp 98.2 ?F (36.8 ?C) (Oral)   Resp (!) 22   LMP 08/20/2011   SpO2 99%  ?Physical Exam ?Vitals and nursing note reviewed.  ?Constitutional:   ?    General: She is not in acute distress. ?HENT:  ?   Head: Normocephalic and atraumatic.  ?   Nose: Nose normal.  ?   Mouth/Throat:  ?   Comments: No evidence of facial swelling or lip swelling.  Normal phonation ?Eyes:  ?   General: No scleral icterus. ?Cardiovascular:  ?   Rate and Rhythm: Normal rate and regular rhythm.  ?   Pulses: Normal pulses.  ?   Heart sounds: Normal heart sounds.  ?Pulmonary:  ?   Effort: Pulmonary effort is normal. No respiratory distress.  ?   Breath sounds: Normal breath sounds. No wheezing.  ?   Comments: No wheezing ?Abdominal:  ?   Palpations: Abdomen is soft.  ?   Tenderness: There is no abdominal tenderness.  ?Musculoskeletal:  ?   Cervical back: Normal range of motion.  ?   Right lower leg: No edema.  ?   Left lower leg: No edema.  ?Skin: ?   General: Skin is warm and dry.  ?   Capillary Refill: Capillary refill takes less than 2 seconds.  ?   Comments: Approximately 2 cm x 2 cm area of slightly red nonindurated soft normal temperature skin.  No hives.  ?Neurological:  ?   Mental Status: She is alert. Mental status is at baseline.  ?Psychiatric:     ?  Mood and Affect: Mood normal.     ?   Behavior: Behavior normal.  ? ? ?ED Results / Procedures / Treatments   ?Labs ?(all labs ordered are listed, but only abnormal results are displayed) ?Labs Reviewed - No data to display ? ?EKG ?None ? ?Radiology ?No results found. ? ?Procedures ?Procedures  ? ? ?Medications Ordered in ED ?Medications  ?ondansetron (ZOFRAN-ODT) disintegrating tablet 4 mg (4 mg Oral Given 06/02/21 0750)  ?diphenhydrAMINE (BENADRYL) capsule 25 mg (25 mg Oral Given 06/02/21 0750)  ? ? ?ED Course/ Medical Decision Making/ A&P ?  ?                        ?Medical Decision Making ?Risk ?Prescription drug management. ? ? ?Patient is a 65 year old female with a past medical history significant for obesity, hypertension, goiter, seasonal allergies, eczema ? ?Patient presents emergency room today after she went to sleep last  night and woke up with a small area of rash/bump on her right shoulder.  She states that it was somewhat red and she was concerned that she was having allergic reaction.  She administered epinephrine and called 911.  Seems that she was on the phone with 911 when she administered epinephrine. ? ? ?Presents to the ER complaining of right shoulder discomfort.  She has a small area of somewhat irritated looking skin on the right shoulder I do not see any evidence of trauma or hives or desquamating skin. ? ?Vital signs within normal limits patient overall well-appearing.  I gave her 1 dose of Benadryl and 1 dose of Zofran and she is somewhat nauseous and reevaluated her approximately 1 hour later.  She is completely symptom-free and asleep at this time she arouses easily to verbal stimulus and confirms that she feels completely better. ? ?She did self administer epinephrine however this is in setting of symptoms that are not consistent with anaphylaxis.  I recommend following up with her PCP return to the ER should she experience any new or concerning symptoms and taking Benadryl as needed for any itching and discomfort of the right shoulder as well as Tylenol I did prescribe her some Zofran for nausea should she experience any more of this. ? ?Final Clinical Impression(s) / ED Diagnoses ?Final diagnoses:  ?Contact dermatitis, unspecified contact dermatitis type, unspecified trigger  ? ? ?Rx / DC Orders ?ED Discharge Orders   ? ? None  ? ?  ? ? ?  ?Tedd Sias, [place] ?06/02/21 1610 ? ?  ?Lucrezia Starch, MD ?06/02/21 [phone] ? ?

## 2021-06-22 ENCOUNTER — Other Ambulatory Visit: Payer: Self-pay | Admitting: Student

## 2021-06-22 DIAGNOSIS — Z1231 Encounter for screening mammogram for malignant neoplasm of breast: Secondary | ICD-10-CM

## 2021-06-27 ENCOUNTER — Ambulatory Visit (INDEPENDENT_AMBULATORY_CARE_PROVIDER_SITE_OTHER): Payer: Medicare Other | Admitting: Cardiovascular Disease

## 2021-06-27 ENCOUNTER — Encounter: Payer: Self-pay | Admitting: Cardiovascular Disease

## 2021-06-27 ENCOUNTER — Encounter: Payer: Self-pay | Admitting: *Deleted

## 2021-06-27 VITALS — BP 126/94 | HR 87 | Ht 65.0 in | Wt 189.8 lb

## 2021-06-27 DIAGNOSIS — I10 Essential (primary) hypertension: Secondary | ICD-10-CM

## 2021-06-27 DIAGNOSIS — R079 Chest pain, unspecified: Secondary | ICD-10-CM

## 2021-06-27 NOTE — Progress Notes (Signed)
Cardiology Office Note:    Date:  06/29/2021   ID:  Yvette Bell, DOB 11-Aug-1955, MRN [id number]  PCP:  Wells Guiles, DO   [facility] Providers Cardiologist:  Acie Fredrickson   Referring MD: Wells Guiles, DO   Chief Complaint  Patient presents with   Hypertension    History of Present Illness:    Yvette Bell is a 66 y.o. female with a hx of chest pain , HTN, bronchospasm with URI  We were asked to see her by Dr. . 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  Has not done a treadmill in years, makes her dizzy .   She went to the emergency room last week for the same chest pains.  Her EKG revealed normal sinus rhythm.  No ST or T wave changes.  Troponins were negative x2.  Her white blood cell count was mildly elevated at 12.5.  Has occasional episodes of tachypalpitations  TSH in Jan looked good / normal   Non smoker   Family hx  of HTN,    June 27, 2021:  Yvette Bell is seen today for follow up of her CP and HTN Myoview was negative for ischemia . Normal LVEF  No cp BP is ok Watches her salt . 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   Lipoma of torso 03/11/2017   Mass on back 10/25/2015   NECK SPASM 02/02/2010   Qualifier: Diagnosis of  By: [name]  MD, [name]     Rectal bleeding 06/17/2014   Skin sensitivity 01/22/2015   Thyroid disease    hyperactive   Tremulousness 12/26/2016   UTERINE FIBROID  03/21/2006   Qualifier: Diagnosis of  By: [name], [name]      Past Surgical History:  Procedure Laterality Date   cesearean section     CHOLECYSTECTOMY N/A 09/14/2016   Procedure: LAPAROSCOPIC CHOLECYSTECTOMY WITH INTRAOPERATIVE CHOLANGIOGRAM;  Surgeon: Alphonsa Overall, MD;  Location: [facility];  Service: General;  Laterality: N/A;   HERNIA REPAIR     LIPOMA EXCISION      Current Medications: Current Meds  Medication Sig   amLODipine (NORVASC) 5 MG tablet Take 1 tablet (5 mg total) by mouth at bedtime.   EPINEPHrine 0.3 mg/0.3 mL IJ SOAJ injection Inject 0.3 mg into the muscle as needed for anaphylaxis.    meloxicam (MOBIC) 7.5 MG tablet Take 1 tablet (7.5 mg total) by mouth daily as needed for pain (chest wall pain).   ondansetron (ZOFRAN-ODT) 4 MG disintegrating tablet Take 1 tablet (4 mg total) by mouth every 8 (eight) hours as needed for nausea or vomiting.   VENTOLIN HFA 108 (90 Base) MCG/ACT inhaler INHALE 1 TO 2 PUFFS INTO THE LUNGS EVERY 6 HOURS AS NEEDED FOR WHEEZING OR SHORTNESS OF BREATH (  Patient taking differently: Inhale 1-2 puffs into the lungs every 6 (six) hours as needed for shortness of breath or wheezing.)     Allergies:   Morphine and Pollen extract   Social History   Socioeconomic History   Marital status: Divorced    Spouse name: Not on file   Number of children: 3   Years of education: 12   Highest education level: High school graduate  Occupational History   Occupation: Disability  Tobacco Use   Smoking status: Never   Smokeless tobacco: Never  Vaping Use   Vaping Use: Never used  Substance and Sexual Activity   Alcohol use: Yes    Alcohol/week: 0.0 standard drinks of alcohol    Comment: Occassionally   Drug use: No   Sexual activity: Not on file  Other Topics Concern   Not on file  Social History Narrative   Patient lives alone in [place].   Patient has 3 children and 10 grandchildren.    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   Patient has a close circle of friends.    Patient dances and walks daily for exercise.    Social Determinants of Health   Financial Resource Strain: Not on file  Food Insecurity: Not on file  Transportation Needs: Not on file  Physical Activity: Not on file  Stress: Stress Concern Present (06/14/2020)   [place]    Feeling of Stress : To some extent  Social Connections: Not on file     Family History: The patient's family history includes Colon polyps in her sister; Diabetes in her sister; Stomach cancer in her father. There is no history of Colon cancer, Esophageal cancer, Gallbladder disease, or Rectal cancer.  ROS:   Please see the history of present illness.     All other systems reviewed and are negative.  EKGs/Labs/Other Studies Reviewed:    The following studies were reviewed today:   EKG:    Recent Labs: 12/31/2020: ALT 21; Magnesium 2.0 02/16/2021: TSH 0.893 03/23/2021: BUN 10; Creatinine, Ser 0.69; Hemoglobin 14.5; Platelets 245; Potassium 3.7; Sodium 141  Recent Lipid Panel    Component Value Date/Time   CHOL 188 03/24/2019 1213   TRIG 67 03/24/2019 1213   HDL 65 03/24/2019 1213   CHOLHDL 2.9 03/24/2019 1213   CHOLHDL 3.1 07/07/2013 1126   VLDL 19 07/07/2013 1126   LDLCALC 111 (H) 03/24/2019 1213     Risk Assessment/Calculations:          Physical Exam: Blood pressure (!) 126/94, pulse 87, height '5\' 5"'$  (1.651 m), weight 189 lb 12.8 oz (86.1 kg), last menstrual period 08/20/2011, SpO2 96 %.  GEN:  Well nourished, well developed in no acute distress HEENT: Normal NECK: No JVD; No carotid bruits LYMPHATICS: No lymphadenopathy CARDIAC: RRR , no murmurs, rubs, gallops RESPIRATORY:  Clear to auscultation without rales, wheezing or rhonchi  ABDOMEN: Soft, non-tender, non-distended MUSCULOSKELETAL:  No edema; No deformity  SKIN: Warm and dry NEUROLOGIC:  Alert and  oriented x 3   ASSESSMENT:    1. Primary hypertension   2. Chest pain, unspecified type     PLAN:       Chest pressure :    myoview was negative for ischemia   2.  Hypertension: BP is well controlled.  Cont meds  She will return to see an APP in 1 year.      Medication Adjustments/Labs and  Tests Ordered: Current medicines are reviewed at length with the patient today.  Concerns regarding medicines are outlined above.  No orders of the defined types were placed in this encounter.  No orders of the defined types were placed in this encounter.    Patient Instructions  Medication Instructions:  Your physician recommends that you continue on your current medications as directed. Please refer to the Current Medication list given to you today.  *If you need a refill on your cardiac medications before your next appointment, please call your pharmacy*   Lab Work: NONE If you have labs (blood work) drawn today and your tests are completely normal, you will receive your results only by: [place] (if you have MyChart) OR A paper copy in the mail If you have any lab test that is abnormal or we need to change your treatment, we will call you to review the results.   Testing/Procedures: NONE   Follow-Up: At [facility], you and your health needs are our priority.  As part of our continuing mission to provide you with exceptional heart care, we have created designated Provider Care Teams.  These Care Teams include your primary Cardiologist (physician) and Advanced Practice Providers (APPs -  Physician Assistants and Nurse Practitioners) who all work together to provide you with the care you need, when you need it.  Your next appointment:   1 year(s)  The format for your next appointment:   In Person  Provider:   Christen Bame, NP Richardson Dopp, PA Mertie Moores, MD   Important Information About Sugar         Signed, Mertie Moores, MD  06/29/2021  6:38 PM    [place]

## 2021-06-27 NOTE — Patient Instructions (Signed)
Medication Instructions:  Your physician recommends that you continue on your current medications as directed. Please refer to the Current Medication list given to you today.  *If you need a refill on your cardiac medications before your next appointment, please call your pharmacy*   Lab Work: NONE If you have labs (blood work) drawn today and your tests are completely normal, you will receive your results only by: [place] (if you have MyChart) OR A paper copy in the mail If you have any lab test that is abnormal or we need to change your treatment, we will call you to review the results.   Testing/Procedures: NONE   Follow-Up: At [facility], you and your health needs are our priority.  As part of our continuing mission to provide you with exceptional heart care, we have created designated Provider Care Teams.  These Care Teams include your primary Cardiologist (physician) and Advanced Practice Providers (APPs -  Physician Assistants and Nurse Practitioners) who all work together to provide you with the care you need, when you need it.  Your next appointment:   1 year(s)  The format for your next appointment:   In Person  Provider:   Christen Bame, NP Richardson Dopp, PA Mertie Moores, MD   Important Information About Sugar

## 2021-07-24 ENCOUNTER — Ambulatory Visit
Admission: RE | Admit: 2021-07-24 | Discharge: 2021-07-24 | Disposition: A | Discharge status: Home / Self Care | Payer: Medicare Other | Source: Ambulatory Visit | Attending: Internal Medicine | Admitting: Internal Medicine

## 2021-07-24 DIAGNOSIS — Z1231 Encounter for screening mammogram for malignant neoplasm of breast: Secondary | ICD-10-CM

## 2021-10-12 ENCOUNTER — Ambulatory Visit
Admission: EM | Admit: 2021-10-12 | Discharge: 2021-10-12 | Disposition: A | Discharge status: Home / Self Care | Payer: Medicare Other | Attending: Physician Assistant | Admitting: Physician Assistant

## 2021-10-12 ENCOUNTER — Other Ambulatory Visit: Payer: Self-pay

## 2021-10-12 ENCOUNTER — Encounter: Payer: Self-pay | Admitting: Emergency Medicine

## 2021-10-12 DIAGNOSIS — Z7951 Long term (current) use of inhaled steroids: Secondary | ICD-10-CM | POA: Diagnosis not present

## 2021-10-12 DIAGNOSIS — I10 Essential (primary) hypertension: Secondary | ICD-10-CM | POA: Diagnosis not present

## 2021-10-12 DIAGNOSIS — Z20822 Contact with and (suspected) exposure to covid-19: Secondary | ICD-10-CM | POA: Diagnosis not present

## 2021-10-12 DIAGNOSIS — Z9049 Acquired absence of other specified parts of digestive tract: Secondary | ICD-10-CM | POA: Diagnosis not present

## 2021-10-12 DIAGNOSIS — R030 Elevated blood-pressure reading, without diagnosis of hypertension: Secondary | ICD-10-CM | POA: Diagnosis not present

## 2021-10-12 DIAGNOSIS — R0981 Nasal congestion: Secondary | ICD-10-CM | POA: Insufficient documentation

## 2021-10-12 DIAGNOSIS — J069 Acute upper respiratory infection, unspecified: Secondary | ICD-10-CM | POA: Diagnosis present

## 2021-10-12 MED ORDER — CETIRIZINE HCL 10 MG PO TABS: 10.0000 mg | ORAL_TABLET | Freq: Every day | ORAL | 0 refills | Status: DC

## 2021-10-12 MED ORDER — FLUTICASONE PROPIONATE 50 MCG/ACT NA SUSP
1.0000 | Freq: Every day | NASAL | 0 refills | Status: DC
Start: 2021-10-12 — End: 2021-10-31

## 2021-10-12 NOTE — ED Triage Notes (Signed)
Pt here for cough and nasal congestion with body aches x 4 days

## 2021-10-12 NOTE — ED Provider Notes (Signed)
[facility]    CSN: [id number] Arrival date & time: 10/12/21  1733      History   Chief Complaint Chief Complaint  Patient presents with   Nasal Congestion    HPI Musette Kisamore is a 66 y.o. female.   Patient presents today with a 4 to 5-day history of URI symptoms including cough, congestion, body aches.  She denies any fever, chest pain, shortness of breath.  Denies any known sick contacts.  She has been using over-the-counter medications for symptom management including Tylenol without improvement of symptoms.  She denies any significant past medical history including allergies, asthma, COPD.  She has used albuterol inhaler in the past but this was for acute illness.  She does not smoke.  She denies any recent antibiotic or steroid use.  Patient's blood pressure is elevated.  She denies any current chest pain, headache, dizziness, vision change, shortness of breath.  She does have a history of hypertension and has been taking her medication as prescribed.  She monitors her blood pressure regularly at home and reports it is generally at goal.  Denies any recent decongestant use, caffeine, sodium, NSAIDs.    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   Lipoma of torso 03/11/2017   Mass on back 10/25/2015   NECK SPASM 02/02/2010   Qualifier: Diagnosis of  By: [name]  MD, [name]     Rectal bleeding 06/17/2014   Skin sensitivity 01/22/2015   Thyroid disease    hyperactive   Tremulousness 12/26/2016   UTERINE  FIBROID 03/21/2006   Qualifier: Diagnosis of  By: [name], [name]      Patient Active Problem List   Diagnosis Date Noted   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   Hematuria 02/16/2021   Discoloration of skin of foot 09/22/2020   Lump of left wrist 02/15/2020   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   Urate crystals present on microscopy 10/03/2019   Hyperglycemia 10/01/2019   Eczema 03/25/2019   Healthcare maintenance 03/13/2018   Seasonal allergic rhinitis due to pollen 09/06/2016   Mild intermittent asthma, uncomplicated 31/49/7026   Food allergy 07/31/2016   Goiter 02/17/2014   Chest pain 06/30/2013   Heart murmur, systolic 37/85/8850   OBESITY 01/19/2008   HYPOTHYROIDISM, HX OF 07/05/2006   HYPERTENSION, BENIGN SYSTEMIC 03/21/2006    Past Surgical History:  Procedure Laterality Date   cesearean section     CHOLECYSTECTOMY N/A 09/14/2016   Procedure: LAPAROSCOPIC CHOLECYSTECTOMY WITH INTRAOPERATIVE CHOLANGIOGRAM;  Surgeon: Alphonsa Overall, MD;  Location: [facility];  Service: General;  Laterality: N/A;   HERNIA REPAIR     LIPOMA EXCISION      OB History   No obstetric history on file.      Home Medications    Prior to Admission medications   Medication Sig Start Date End Date Taking? Authorizing Provider  cetirizine (ZYRTEC ALLERGY) 10 MG tablet Take 1 tablet (10 mg total)  by mouth daily. 10/12/21  Yes Montey Ebel K, PA-C  fluticasone (FLONASE) 50 MCG/ACT nasal spray Place 1 spray into both nostrils daily. 10/12/21  Yes Heloise Gordan K, PA-C  amLODipine (NORVASC) 5 MG tablet Take 1 tablet (5 mg total) by mouth at bedtime. 02/24/21   Lurline Del, DO  EPINEPHrine 0.3 mg/0.3 mL IJ SOAJ injection Inject 0.3 mg into the muscle as needed for anaphylaxis.  07/31/16   [provider]  meloxicam (MOBIC) 7.5 MG tablet Take 1 tablet (7.5 mg total) by mouth daily as needed for pain (chest wall pain). 03/24/21   Orpah Greek, MD  ondansetron (ZOFRAN-ODT) 4 MG disintegrating tablet Take  1 tablet (4 mg total) by mouth every 8 (eight) hours as needed for nausea or vomiting. 06/02/21   Fondaw, Wylder S, PA  VENTOLIN HFA 108 (90 Base) MCG/ACT inhaler INHALE 1 TO 2 PUFFS INTO THE LUNGS EVERY 6 HOURS AS NEEDED FOR WHEEZING OR SHORTNESS OF BREATH Patient taking differently: Inhale 1-2 puffs into the lungs every 6 (six) hours as needed for shortness of breath or wheezing. 06/08/20   Benay Pike, MD    Family History Family History  Problem Relation Age of Onset   Stomach cancer Father    Colon polyps Sister    Diabetes Sister    Colon cancer Neg Hx    Esophageal cancer Neg Hx    Gallbladder disease Neg Hx    Rectal cancer Neg Hx     Social History Social History   Tobacco Use   Smoking status: Never   Smokeless tobacco: Never  Vaping Use   Vaping Use: Never used  Substance Use Topics   Alcohol use: Yes    Alcohol/week: 0.0 standard drinks of alcohol    Comment: Occassionally   Drug use: No     Allergies   Morphine and Pollen extract   Review of Systems Review of Systems  Constitutional:  Positive for activity change. Negative for appetite change, fatigue and fever.  HENT:  Positive for congestion and sinus pressure. Negative for sneezing and sore throat.   Eyes:  Negative for visual disturbance.  Respiratory:  Positive for cough. Negative for shortness of breath.   Cardiovascular:  Negative for chest pain.  Gastrointestinal:  Negative for abdominal pain, diarrhea, nausea and vomiting.  Neurological:  Negative for dizziness, light-headedness and headaches.     Physical Exam Triage Vital Signs ED Triage Vitals [10/12/21 1859]  Enc Vitals Group     BP (!) 168/101     Pulse Rate 99     Resp 18     Temp 98.1 F (36.7 C)     Temp Source Oral     SpO2 95 %     Weight      Height      Head Circumference      Peak Flow      Pain Score 2     Pain Loc      Pain Edu?      Excl. in [place]?    No data found.  Updated Vital Signs BP (!) 162/95 (BP  Location: Left Arm)   Pulse 99   Temp 98.1 F (36.7 C) (Oral)   Resp 18   LMP 08/20/2011   SpO2 95%   Visual Acuity Right Eye Distance:   Left Eye Distance:   Bilateral Distance:    Right Eye Near:   Left Eye Near:    Bilateral Near:     Physical  Exam Vitals reviewed.  Constitutional:      General: She is awake. She is not in acute distress.    Appearance: Normal appearance. She is well-developed. She is not ill-appearing.     Comments: Very pleasant female appears stated age in no acute distress sitting comfortably in exam room  HENT:     Head: Normocephalic and atraumatic.     Right Ear: Tympanic membrane, ear canal and external ear normal. There is impacted cerumen. Tympanic membrane is not erythematous or bulging.     Left Ear: Tympanic membrane, ear canal and external ear normal. Tympanic membrane is not erythematous or bulging.     Nose:     Right Sinus: No maxillary sinus tenderness or frontal sinus tenderness.     Left Sinus: No maxillary sinus tenderness or frontal sinus tenderness.     Mouth/Throat:     Pharynx: Uvula midline. Posterior oropharyngeal erythema present. No oropharyngeal exudate.     Comments: Erythema and drainage in posterior pharynx Cardiovascular:     Rate and Rhythm: Normal rate and regular rhythm.     Heart sounds: No murmur heard. Pulmonary:     Effort: Pulmonary effort is normal.     Breath sounds: Normal breath sounds. No wheezing, rhonchi or rales.     Comments: Clear to auscultation bilaterally Musculoskeletal:     Right lower leg: No edema.     Left lower leg: No edema.  Psychiatric:        Behavior: Behavior is cooperative.      UC Treatments / Results  Labs (all labs ordered are listed, but only abnormal results are displayed) Labs Reviewed  RESP PANEL BY RT-PCR (FLU A&B, COVID) ARPGX2    EKG   Radiology No results found.  Procedures Procedures (including critical care time)  Medications Ordered in UC Medications  - No data to display  Initial Impression / Assessment and Plan / UC Course  I have reviewed the triage vital signs and the nursing notes.  Pertinent labs & imaging results that were available during my care of the patient were reviewed by me and considered in my medical decision making (see chart for details).     Suspect viral etiology given clinical presentation.  No evidence of acute infection on physical exam that would warrant initiation of antibiotics.  Patient was started on cetirizine and Flonase to help manage cough and congestion.  Also recommended over-the-counter medications including Mucinex, sinus rinse, Tylenol for additional symptom relief.  COVID and flu testing were obtained per patient request-results pending.  She is outside the window of effectiveness for Tamiflu and by the time we receive results will be outside the window of effectiveness for COVID antivirals.  She was encouraged to rest and drink plenty of fluid.  Discussed that if her symptoms or not improving by next week she should return for reevaluation.  If at any point she has worsening symptoms including chest pain, shortness of breath, fever, nausea, vomiting, weakness that she needs to go to the emergency room immediately to which she expressed understanding.  Work excuse note with current CDC return to work guidelines provided during visit.  Blood pressure is elevated today.  Patient denies any signs/symptoms of endorgan damage.  She was encouraged to avoid NSAIDs, caffeine, sodium, decongestants.  She is to monitor her blood pressure at home and if this remains elevated follow-up with her primary care for medication adjustment.  Discussed that if she develops any chest pain, shortness of breath, headache,  vision change, dizziness in setting of hyper pressure she needs to go to the emergency room.  Final Clinical Impressions(s) / UC Diagnoses   Final diagnoses:  Upper respiratory tract infection, unspecified type   Nasal congestion  Elevated blood pressure reading     Discharge Instructions      I believe you have a virus.  Start cetirizine and Flonase to help with your congestion.  Use sinus rinses and continue with your teas.  You can use over-the-counter medications as needed for additional symptom relief.  We will contact you if you are positive for COVID or flu.  Unfortunately, you are outside the window of effectiveness for Tamiflu or other antivirals.  If your symptoms are not improving by next week follow-up with your primary care.  If at any point anything worsens you develop chest pain, shortness of breath, fever, nausea/vomiting interfere with oral intake you need to be seen immediately.  Your blood pressure is elevated.  I suspect this is because you are sick.  Avoid decongestants, caffeine, sodium, NSAIDs (aspirin, ibuprofen/Advil, naproxen/Aleve).  Monitor your blood pressure at home.  If this becomes elevated please return for reevaluation.  If you develop any chest pain, shortness of breath, headache, vision change, dizziness in the setting of high blood pressure you need to go to the emergency room.     ED Prescriptions     Medication Sig Dispense Auth. Provider   cetirizine (ZYRTEC ALLERGY) 10 MG tablet Take 1 tablet (10 mg total) by mouth daily. 30 tablet Jaydn Fincher K, PA-C   fluticasone (FLONASE) 50 MCG/ACT nasal spray Place 1 spray into both nostrils daily. 16 g Trenae Brunke K, PA-C      PDMP not reviewed this encounter.   Terrilee Croak, PA-C 10/12/21 2004

## 2021-10-12 NOTE — Discharge Instructions (Signed)
I believe you have a virus.  Start cetirizine and Flonase to help with your congestion.  Use sinus rinses and continue with your teas.  You can use over-the-counter medications as needed for additional symptom relief.  We will contact you if you are positive for COVID or flu.  Unfortunately, you are outside the window of effectiveness for Tamiflu or other antivirals.  If your symptoms are not improving by next week follow-up with your primary care.  If at any point anything worsens you develop chest pain, shortness of breath, fever, nausea/vomiting interfere with oral intake you need to be seen immediately.  Your blood pressure is elevated.  I suspect this is because you are sick.  Avoid decongestants, caffeine, sodium, NSAIDs (aspirin, ibuprofen/Advil, naproxen/Aleve).  Monitor your blood pressure at home.  If this becomes elevated please return for reevaluation.  If you develop any chest pain, shortness of breath, headache, vision change, dizziness in the setting of high blood pressure you need to go to the emergency room.

## 2021-10-13 LAB — RESP PANEL BY RT-PCR (FLU A&B, COVID) ARPGX2
Influenza A by PCR: NEGATIVE
Influenza B by PCR: NEGATIVE
SARS Coronavirus 2 by RT PCR: NEGATIVE

## 2021-10-31 ENCOUNTER — Ambulatory Visit (INDEPENDENT_AMBULATORY_CARE_PROVIDER_SITE_OTHER): Payer: Medicare Other | Admitting: Student

## 2021-10-31 VITALS — BP 158/92 | HR 90 | Ht 65.0 in | Wt 191.2 lb

## 2021-10-31 DIAGNOSIS — I10 Essential (primary) hypertension: Secondary | ICD-10-CM

## 2021-10-31 MED ORDER — AMLODIPINE-OLMESARTAN 5-20 MG PO TABS: 1.0000 | ORAL_TABLET | Freq: Every day | ORAL | 1 refills | Status: DC

## 2021-10-31 NOTE — Progress Notes (Signed)
    SUBJECTIVE:   CHIEF COMPLAINT / HPI:   Elevated Blood Pressure Patient routinely monitors her BP at home and has noted that she has been running higher over the past several weeks, routinely in the 150s/90s. She denies any CP, SOB, HA, or visual  changes over the same period. Has been adherent to her daily amlodipine '5mg'$  which she tolerates well. Otherwise has been feeling like herself. No complaints aside from elevated BP.   OBJECTIVE:   BP (!) 158/92   Pulse 90   Ht '5\' 5"'$  (1.651 m)   Wt 191 lb 3.2 oz (86.7 kg)   LMP 08/20/2011   SpO2 98%   BMI 31.82 kg/m   General: alert & oriented, no apparent distress, well groomed HEENT: normocephalic, atraumatic, EOM grossly intact, oral mucosa moist, neck supple Respiratory: normal respiratory effort GI: non-distended Skin: no rashes, no jaundice Psych: appropriate mood and affect   ASSESSMENT/PLAN:   HYPERTENSION, BENIGN SYSTEMIC Seems she is not adequately controlled on amlodipine monotherapy. Will transition to amlodipine-olmesartan combo therapy. With initiation of ARB will check BMP today and bring back in 2 weeks for repeat BMP. - Amlodipine-Olmesartan 5-'20mg'$  daily - BMP today and again in 2 weeks - 6 wk PCP follow-up      Pearla Dubonnet, MD [place]

## 2021-10-31 NOTE — Assessment & Plan Note (Signed)
Seems she is not adequately controlled on amlodipine monotherapy. Will transition to amlodipine-olmesartan combo therapy. With initiation of ARB will check BMP today and bring back in 2 weeks for repeat BMP. - Amlodipine-Olmesartan 5-'20mg'$  daily - BMP today and again in 2 weeks - 6 wk PCP follow-up

## 2021-10-31 NOTE — Patient Instructions (Addendum)
Ms. Bernardi,  It is so nice to meet you!  Your blood pressure is too high. I am switching you to a combination medicine called amlodipine-olmesartan. It should be covered by your insurance. If this ends up being too expensive at the pharmacy, call and let me know.  I will need you to come back in 2 weeks for a lab visit to get your kidney levels checked. I will call you if we need to make any changes.   Pearla Dubonnet, MD

## 2021-11-01 LAB — BASIC METABOLIC PANEL
BUN/Creatinine Ratio: 14 (ref 12–28)
BUN: 10 mg/dL (ref 8–27)
CO2: 22 mmol/L (ref 20–29)
Calcium: 9.5 mg/dL (ref 8.7–10.3)
Chloride: 105 mmol/L (ref 96–106)
Creatinine, Ser: 0.71 mg/dL (ref 0.57–1.00)
Glucose: 94 mg/dL (ref 70–99)
Potassium: 3.9 mmol/L (ref 3.5–5.2)
Sodium: 142 mmol/L (ref 134–144)
eGFR: 95 mL/min/{1.73_m2} (ref >59)

## 2021-11-02 ENCOUNTER — Telehealth: Payer: Self-pay

## 2021-11-02 DIAGNOSIS — I10 Essential (primary) hypertension: Secondary | ICD-10-CM

## 2021-11-02 NOTE — Telephone Encounter (Signed)
Patient calls nurse line regarding BP medication management. She is asking if she can increase to 10 mg amlodipine instead of starting new medication, amlodipine-olmesartan.   Will forward to Dr. Joelyn Oms as he saw patient in clinic for this concern.   Talbot Grumbling, RN

## 2021-11-03 MED ORDER — AMLODIPINE BESYLATE 10 MG PO TABS: 10.0000 mg | ORAL_TABLET | Freq: Every day | ORAL | 3 refills | Status: DC

## 2021-11-03 NOTE — Telephone Encounter (Signed)
Called patient to discuss. She is concerned about ARB effect on renal function. I described the renal protective properties of ARBs but she declines new combination pill at this time. Would prefer amlodipine '10mg'$ . I explained the risk of LE edema and she voices understanding of risk. She will monitor and will follow-up with Dr. [place] Hickman in a few weeks to re-evaluate in 6 weeks.

## 2021-11-14 ENCOUNTER — Other Ambulatory Visit: Payer: Medicare Other

## 2021-11-14 DIAGNOSIS — I10 Essential (primary) hypertension: Secondary | ICD-10-CM

## 2021-11-15 ENCOUNTER — Encounter: Payer: Self-pay | Admitting: Student

## 2021-11-15 LAB — BASIC METABOLIC PANEL
BUN/Creatinine Ratio: 20 (ref 12–28)
BUN: 13 mg/dL (ref 8–27)
CO2: 24 mmol/L (ref 20–29)
Calcium: 10.1 mg/dL (ref 8.7–10.3)
Chloride: 107 mmol/L — ABNORMAL HIGH (ref 96–106)
Creatinine, Ser: 0.64 mg/dL (ref 0.57–1.00)
Glucose: 91 mg/dL (ref 70–99)
Potassium: 4.1 mmol/L (ref 3.5–5.2)
Sodium: 146 mmol/L — ABNORMAL HIGH (ref 134–144)
eGFR: 99 mL/min/{1.73_m2} (ref >59)

## 2021-12-11 NOTE — Progress Notes (Unsigned)
  SUBJECTIVE:   CHIEF COMPLAINT / HPI:   Hypertension: BP: 117/82 today. Home medications include: Amlodipine 10 mg daily. She endorses taking these medications as prescribed. Does check blood pressure at home, states her pressures at home 110s-120s/80s. Exercise via walking and dancing. Patient has had a BMP in the past 1 year.  Presents for Pap smear.  Last performed on 03/12/2018 with negative HPV and cytology. Additionally requesting wet prep for vaginal discharge x 2 weeks. Not sexually active.   PERTINENT  PMH / PSH: HTN, hematuria, eczema  OBJECTIVE:  BP 117/82   Pulse 94   Wt 190 lb (86.2 kg)   LMP 08/20/2011   SpO2 99%   BMI 31.62 kg/m  General: Awake, alert, NAD CV: RRR, no murmur auscultated Pulmonary: CTAB, normal WOB  Pelvic exam: VULVA: normal appearing vulva with no masses, tenderness or lesions, VAGINA: normal appearing vagina with normal color and discharge, no lesions, vaginal discharge - white and creamy, WET MOUNT done - results: negative for pathogens, normal epithelial cells, CERVIX: normal appearing cervix without discharge or lesions however difficult to visualize, exam chaperoned by Glori Bickers.   ASSESSMENT/PLAN:  Primary hypertension Assessment & Plan: BP: 117/82 today. Well controlled. Goal of <130/80. Continue to work on healthy dietary habits and exercise.  Medication regimen: Amlodipine 10 mg daily  Orders: -     POCT urinalysis dipstick  Screening for cervical cancer -     Cytology - PAP  Vaginal discharge Assessment & Plan: Minimal white/clear discharge.  Not sexually active, wet mount without evidence of BV or yeast.  Many bacteria with 5-10 WBC.  Asymptomatic, no signs of UTI.  Likely vaginal flora, no treatment necessary at this time.  Orders: -     POCT Wet Prep [facility])  Asymptomatic microscopic hematuria Assessment & Plan: Follow-up UA for microscopic hematuria noted on 02/16/2021. 0-2 RBCs in urine.  Follows with urology annually  unless gross hematuria is appreciated in which she should follow-up sooner.  Orders: -     POCT UA - Microscopic Only -     POCT urinalysis dipstick  Other hyperlipidemia Assessment & Plan: Elevated LDL (111) in 2021. Recheck today.  Orders: -     Lipid panel  Return in about 6 months (around 06/12/2022) for Hypertension. Wells Guiles, DO 12/12/2021, 12:02 PM PGY-2, [place]

## 2021-12-12 ENCOUNTER — Other Ambulatory Visit (HOSPITAL_COMMUNITY)
Admission: RE | Admit: 2021-12-12 | Discharge: 2021-12-12 | Disposition: A | Discharge status: Home / Self Care | Payer: Medicare Other | Source: Ambulatory Visit | Attending: Family Medicine | Admitting: Family Medicine

## 2021-12-12 ENCOUNTER — Encounter: Payer: Self-pay | Admitting: Student

## 2021-12-12 ENCOUNTER — Ambulatory Visit (INDEPENDENT_AMBULATORY_CARE_PROVIDER_SITE_OTHER): Payer: Medicare Other | Admitting: Student

## 2021-12-12 VITALS — BP 117/82 | HR 94 | Wt 190.0 lb

## 2021-12-12 DIAGNOSIS — E785 Hyperlipidemia, unspecified: Secondary | ICD-10-CM | POA: Insufficient documentation

## 2021-12-12 DIAGNOSIS — R3121 Asymptomatic microscopic hematuria: Secondary | ICD-10-CM

## 2021-12-12 DIAGNOSIS — Z01419 Encounter for gynecological examination (general) (routine) without abnormal findings: Secondary | ICD-10-CM | POA: Insufficient documentation

## 2021-12-12 DIAGNOSIS — I10 Essential (primary) hypertension: Secondary | ICD-10-CM | POA: Diagnosis present

## 2021-12-12 DIAGNOSIS — N898 Other specified noninflammatory disorders of vagina: Secondary | ICD-10-CM

## 2021-12-12 DIAGNOSIS — Z124 Encounter for screening for malignant neoplasm of cervix: Secondary | ICD-10-CM | POA: Diagnosis not present

## 2021-12-12 DIAGNOSIS — Z1151 Encounter for screening for human papillomavirus (HPV): Secondary | ICD-10-CM | POA: Diagnosis not present

## 2021-12-12 DIAGNOSIS — E7849 Other hyperlipidemia: Secondary | ICD-10-CM

## 2021-12-12 LAB — POCT URINALYSIS DIP (MANUAL ENTRY)
Bilirubin, UA: NEGATIVE
Glucose, UA: NEGATIVE mg/dL
Ketones, POC UA: NEGATIVE mg/dL
Leukocytes, UA: NEGATIVE
Nitrite, UA: NEGATIVE
Spec Grav, UA: 1.02 (ref 1.010–1.025)
Urobilinogen, UA: 0.2 E.U./dL
pH, UA: 5.5 (ref 5.0–8.0)

## 2021-12-12 LAB — POCT UA - MICROSCOPIC ONLY
Epithelial cells, urine per micros: >20
WBC, Ur, HPF, POC: NONE SEEN (ref 0–5)

## 2021-12-12 LAB — POCT WET PREP (WET MOUNT)
Clue Cells Wet Prep Whiff POC: NEGATIVE
Trichomonas Wet Prep HPF POC: ABSENT

## 2021-12-12 NOTE — Patient Instructions (Addendum)
It was great to see you today! Thank you for choosing [facility] for your primary care. Yvette Bell was seen for hypertension and Pap smear.  Today we addressed: Hypertension: Continue your amlodipine.  Your blood pressure looks great. Pap smear, vaginal discharge: Your wet prep was negative for yeast and BV.  Your urine is clear for bacteria.  You may have a tiny bit of overgrowth but nothing that is concerning or requiring treatment unless you develop symptoms.  If this starts to get worse, please let me know. Hematuria: There is still some trace blood in your urine.  Please continue to follow with urology and if you start to notice blood in your urine, return sooner. Cholesterol: I am checking your cholesterol levels today. Please get your shingles vaccine at your pharmacy.  When you complete this, I would appreciate if you could have the forms sent to [place] or send [place] a message.  If you haven't already, sign up for My Chart to have easy access to your labs results, and communication with your primary care physician.  We are checking some labs today. If they are abnormal, I will call you. If they are normal, I will send you a MyChart message (if it is active) or a letter in the mail. If you do not hear about your labs in the next 2 weeks, please call the office. I recommend that you always bring your medications to each appointment as this makes it easy to ensure you are on the correct medications and helps [place] not miss refills when you need them. Call the clinic at [phone] if your symptoms worsen or you have any concerns.  You should return to our clinic Return in about 6 months (around 06/12/2022) for Hypertension. Please arrive 15 minutes before your appointment to ensure smooth check in process.  We appreciate your efforts in making this happen.  Thank you for allowing me to participate in your care, Wells Guiles, DO 12/12/2021, 11:07 AM PGY-2, [place]

## 2021-12-12 NOTE — Assessment & Plan Note (Addendum)
BP: 117/82 today. Well controlled. Goal of <130/80. Continue to work on healthy dietary habits and exercise.  Medication regimen: Amlodipine 10 mg daily

## 2021-12-12 NOTE — Assessment & Plan Note (Addendum)
Follow-up UA for microscopic hematuria noted on 02/16/2021. 0-2 RBCs in urine.  Follows with urology annually unless gross hematuria is appreciated in which she should follow-up sooner.

## 2021-12-12 NOTE — Assessment & Plan Note (Signed)
Elevated LDL (111) in 2021. Recheck today.

## 2021-12-12 NOTE — Assessment & Plan Note (Signed)
Minimal white/clear discharge.  Not sexually active, wet mount without evidence of BV or yeast.  Many bacteria with 5-10 WBC.  Asymptomatic, no signs of UTI.  Likely vaginal flora, no treatment necessary at this time.

## 2021-12-13 LAB — LIPID PANEL
Chol/HDL Ratio: 2.9 ratio (ref 0.0–4.4)
Cholesterol, Total: 202 mg/dL — ABNORMAL HIGH (ref 100–199)
HDL: 70 mg/dL (ref >39)
LDL Chol Calc (NIH): 120 mg/dL — ABNORMAL HIGH (ref 0–99)
Triglycerides: 69 mg/dL (ref 0–149)
VLDL Cholesterol Cal: 12 mg/dL (ref 5–40)

## 2021-12-13 LAB — CYTOLOGY - PAP
Adequacy: ABSENT
Comment: NEGATIVE
Diagnosis: NEGATIVE
High risk HPV: NEGATIVE

## 2021-12-19 ENCOUNTER — Encounter: Payer: Self-pay | Admitting: Student

## 2021-12-21 ENCOUNTER — Ambulatory Visit (INDEPENDENT_AMBULATORY_CARE_PROVIDER_SITE_OTHER): Payer: Medicare Other | Admitting: Family Medicine

## 2021-12-21 VITALS — BP 122/76 | HR 109 | Temp 101.4°F | Ht 65.0 in | Wt 197.2 lb

## 2021-12-21 DIAGNOSIS — R509 Fever, unspecified: Secondary | ICD-10-CM | POA: Diagnosis not present

## 2021-12-21 DIAGNOSIS — J069 Acute upper respiratory infection, unspecified: Secondary | ICD-10-CM | POA: Insufficient documentation

## 2021-12-21 DIAGNOSIS — R051 Acute cough: Secondary | ICD-10-CM

## 2021-12-21 LAB — POCT INFLUENZA A/B
Influenza A, POC: NEGATIVE
Influenza B, POC: NEGATIVE

## 2021-12-21 MED ORDER — BENZONATATE 100 MG PO CAPS: 200.0000 mg | ORAL_CAPSULE | Freq: Three times a day (TID) | ORAL | 0 refills | Status: DC

## 2021-12-21 MED ORDER — GUAIFENESIN ER 600 MG PO TB12: 600.0000 mg | ORAL_TABLET | Freq: Two times a day (BID) | ORAL | 0 refills | Status: DC

## 2021-12-21 NOTE — Assessment & Plan Note (Signed)
Acute onset cough, fever x2days.  Examination notable for tachycardia, fever 101.4, otherwise lungs were clear and patient is non-toxic appearing. POC influenza A/B was negative. She was swabbed for COVID, would be within the treatment window if positive.  -F/u COVID, will offer antivirals if positive -Symptomatic treatment with Tylenol/Motrin, honey, antitussives, Mucinex, nasal saline spray, Vicks vapor rub -Return precautions provided

## 2021-12-21 NOTE — Progress Notes (Signed)
    SUBJECTIVE:   CHIEF COMPLAINT / HPI:   Yvette Bell is a 66 y.o. female who presents to the [facility] clinic today to discuss the following concerns:   Coughing, Fever Symptom onset 2 days ago with chills. She has not checked her temperature. She also has a white productive cough. No SOB or chest pain. She has been around sick contacts but unsure if anyone had [place]. She has been drinking tea and [place]. Some nausea but no vomiting. She does gag after coughing fits. She has been having some muscle aches.   She has had two COVID vaccines. Has not had the influenza vaccine.   PERTINENT  PMH / PSH: HTN  OBJECTIVE:   BP 122/76   Pulse (!) 109   Temp (!) 101.4 F (38.6 C)   Ht '5\' 5"'$  (1.651 m)   Wt 197 lb 3.2 oz (89.4 kg)   LMP 08/20/2011   SpO2 95%   BMI 32.82 kg/m     General: NAD, pleasant, able to participate in exam HEENT: Normocephalic, EOMI, nares patent without rhinorrhea, oropharynx clear without erythema or exudates Cardiac: Tachycardic, no murmurs. Respiratory: CTAB, normal effort, No wheezes, rales or rhonchi Skin: warm and dry, no rashes noted Psych: Normal affect and mood  ASSESSMENT/PLAN:   Viral URI with cough Acute onset cough, fever x2days.  Examination notable for tachycardia, fever 101.4, otherwise lungs were clear and patient is non-toxic appearing. POC influenza A/B was negative. She was swabbed for COVID, would be within the treatment window if positive.  -F/u COVID, will offer antivirals if positive -Symptomatic treatment with Tylenol/Motrin, honey, antitussives, Mucinex, nasal saline spray, Vicks vapor rub -Return precautions provided    Sharion Settler, [place]

## 2021-12-21 NOTE — Patient Instructions (Addendum)
It was wonderful to see you today.  Please bring ALL of your medications with you to every visit.   Today we talked about:  I am sending some cough medication for you. Continue to drink teas and take honey for your cough. You can use Vicks vapor rub under your nose and on your chest for your congestion.  You can use saline nasal sprays for congestion.  You can take 500 mg of Tylenol every 6 hours as needed for pain or fever You can take 400 mg of Ibuprofen every 6 hours as needed for pain or fever  We did tests today for COVID and flu. I will let you know the results when they return.  Please seek care if you feel like your symptoms are worsening, if you have shortness of breath or chest pain or can't keep any fluids down.  Thank you for coming to your visit as scheduled. We have had a large "no-show" problem lately, and this significantly limits our ability to see and care for patients. As a friendly reminder- if you cannot make your appointment please call to cancel. We do have a no show policy for those who do not cancel within 24 hours. Our policy is that if you miss or fail to cancel an appointment within 24 hours, 3 times in a 41-monthperiod, you may be dismissed from our clinic.   Thank you for choosing [facility]   Please call [phone] any questions about today's appointment.  Please be sure to schedule follow up at the front  desk before you leave today.   ASharion Settler DO PGY-3 Family Medicine

## 2021-12-23 LAB — NOVEL CORONAVIRUS, NAA

## 2021-12-24 ENCOUNTER — Telehealth: Payer: Self-pay | Admitting: Student

## 2021-12-24 NOTE — Telephone Encounter (Signed)
**  After Hours/ Emergency Line Call**  Received a page to call [phone]) - [phone].  Patient: Yvette Bell  Caller: Self  Confirmed name & DOB of patient with caller  Subjective:  Was seen in clinic on 11/30 for fever/cough/and congestion. She had a negative Covid and Flu. Patient reports cough has continued and she is now having some streaks of blood in sputum, with burning in chest. No fever's, CP or SOB, and no N/V/Diarrhea, no pleuritic chest pain, or body aches. Report's using mucinex and drinking hot fluids. Feels like she's getting better all around, but the chest pain is bothering her.     Objective:  Observations: NAD, speaking in full sentences, good WOB   Assessment & Plan  Yvette Bell is a 65 y.o. female with post viral cough following URI vs virus. Less concerned for PNA given lack systemic symptoms and patient feeling better. Low concern for PE as patient has no SOB or CP, but does have burning pain with cough.    Recommendations:  Honey, warm teas for cough OTC pain meds for pain with cough   -- Red flags discussed.   -- Will forward to PCP.  Holley Bouche, MD [facility] [employer], PGY-1

## 2022-05-30 ENCOUNTER — Telehealth: Payer: Self-pay | Admitting: Student

## 2022-05-30 NOTE — Telephone Encounter (Signed)
Contacted Donnis Mckell to schedule their annual wellness visit. Appointment made for 06/01/2022.  Thank you,  [facility] Support [facility] Direct dial  [phone]

## 2022-06-01 ENCOUNTER — Ambulatory Visit (INDEPENDENT_AMBULATORY_CARE_PROVIDER_SITE_OTHER): Payer: Medicare HMO

## 2022-06-01 VITALS — Ht 65.0 in | Wt 197.0 lb

## 2022-06-01 DIAGNOSIS — Z0000 Encounter for general adult medical examination without abnormal findings: Secondary | ICD-10-CM | POA: Diagnosis not present

## 2022-06-01 DIAGNOSIS — Z1231 Encounter for screening mammogram for malignant neoplasm of breast: Secondary | ICD-10-CM | POA: Diagnosis not present

## 2022-06-01 NOTE — Progress Notes (Cosign Needed)
Subjective:   Yvette Bell is a 67 y.o. female who presents for Medicare Annual (Subsequent) preventive examination.  I connected with  Yvette Bell on 06/01/22 by a audio enabled telemedicine application and verified that I am speaking with the correct person using two identifiers.  Patient Location: Home  Provider Location: Home Office  I discussed the limitations of evaluation and management by telemedicine. The patient expressed understanding and agreed to proceed.  Review of Systems     Cardiac Risk Factors include: hypertension     Objective:    Today's Vitals   06/01/22 1410  Weight: 197 lb (89.4 kg)  Height: 5\' 5"  (1.651 m)   Body mass index is 32.78 kg/m.     06/01/2022    2:16 PM 12/12/2021   10:19 AM 10/31/2021   11:03 AM 06/02/2021    6:46 AM 02/24/2021    3:22 PM 02/16/2021    2:33 PM 12/31/2020    2:26 PM  Advanced Directives  Does Patient Have a Medical Advance Directive? No No No No No No No  Would patient like information on creating a medical advance directive? Yes (MAU/Ambulatory/Procedural Areas - Information given) No - Patient declined No - Patient declined No - Patient declined No - Patient declined No - Patient declined     Current Medications (verified) Outpatient Encounter Medications as of 06/01/2022  Medication Sig   amLODipine (NORVASC) 10 MG tablet Take 1 tablet (10 mg total) by mouth at bedtime.   EPINEPHrine 0.3 mg/0.3 mL IJ SOAJ injection Inject 0.3 mg into the muscle as needed for anaphylaxis.  (Patient not taking: Reported on 12/21/2021)   [DISCONTINUED] benzonatate (TESSALON PERLES) 100 MG capsule Take 2 capsules (200 mg total) by mouth 3 (three) times daily.   [DISCONTINUED] guaiFENesin (MUCINEX) 600 MG 12 hr tablet Take 1 tablet (600 mg total) by mouth 2 (two) times daily.   [DISCONTINUED] VENTOLIN HFA 108 (90 Base) MCG/ACT inhaler INHALE 1 TO 2 PUFFS INTO THE LUNGS EVERY 6 HOURS AS NEEDED FOR WHEEZING OR SHORTNESS OF BREATH (Patient  taking differently: Inhale 1-2 puffs into the lungs every 6 (six) hours as needed for shortness of breath or wheezing.)   No facility-administered encounter medications on file as of 06/01/2022.    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   Lipoma of torso 03/11/2017   Mass on back 10/25/2015   NECK SPASM 02/02/2010   Qualifier: Diagnosis of  By: Wallene Huh  MD, [name]     Rectal bleeding 06/17/2014   Skin sensitivity 01/22/2015   Thyroid disease    hyperactive   Tremulousness 12/26/2016   UTERINE FIBROID 03/21/2006   Qualifier: Diagnosis of  By: Mannie Stabile MD, [name]     Past Surgical History:  Procedure Laterality Date   cesearean section     CHOLECYSTECTOMY N/A 09/14/2016   Procedure: LAPAROSCOPIC CHOLECYSTECTOMY  WITH INTRAOPERATIVE CHOLANGIOGRAM;  Surgeon: Ovidio Kin, MD;  Location: [facility];  Service: General;  Laterality: N/A;   HERNIA REPAIR     LIPOMA EXCISION     Family History  Problem Relation Age of Onset   Stomach cancer Father    Colon polyps Sister    Diabetes Sister    Colon cancer Neg Hx    Esophageal cancer Neg Hx    Gallbladder disease Neg Hx    Rectal cancer Neg Hx    Social History   Socioeconomic History   Marital status: Divorced    Spouse name: Not on file   Number of children: 3   Years of education: 12   Highest education level: High school graduate  Occupational History   Occupation: Disability  Tobacco Use   Smoking status: Never     Passive exposure: Never   Smokeless tobacco: Never  Vaping Use   Vaping Use: Never used  Substance and Sexual Activity   Alcohol use: Yes    Alcohol/week: 0.0 standard drinks of alcohol    Comment: Occassionally   Drug use: No   Sexual activity: Not on file  Other Topics Concern   Not on file  Social History Narrative   Patient lives alone in [place].   Patient has 3 children and 10 grandchildren.    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   Patient has a close circle of friends.    Patient dances and walks daily for exercise.    Social Determinants of Health   Financial Resource Strain: Low Risk  (06/01/2022)   Overall Financial Resource Strain (CARDIA)    Difficulty of Paying Living Expenses: Not hard at all  Food Insecurity: No Food Insecurity (06/01/2022)   Hunger Vital Sign    Worried About Running Out of Food in the Last Year: Never true    Ran Out of Food in the Last Year: Never true  Transportation Needs: No Transportation Needs (06/01/2022)   PRAPARE - Administrator, Civil Service (Medical): No    Lack of Transportation (Non-Medical): No  Physical Activity: Sufficiently Active (06/01/2022)   Exercise Vital Sign    Days of Exercise per Week: 7 days    Minutes of Exercise per Session: 60 min  Stress: No Stress Concern Present (06/01/2022)   [employer] of Occupational Health - Occupational Stress Questionnaire    Feeling of Stress : Only a little  Social Connections: Moderately Integrated (06/01/2022)   Social Connection and Isolation Panel [NHANES]    Frequency of Communication with Friends and Family: More than three times a week    Frequency of Social Gatherings with Friends and Family: More than three times a week    Attends Religious Services: More than 4 times per year    Active Member of [employer] or Organizations: Yes    Attends Engineer, structural: More than 4 times per year    Marital Status:  Divorced    Tobacco Counseling Counseling given: Not Answered   Clinical Intake:  Pre-visit preparation completed: Yes  Pain : No/denies pain  Diabetes: No  How often do you need to have someone help you when you read instructions, pamphlets, or other written materials from your doctor or pharmacy?: 1 - Never  Diabetic?No   Interpreter Needed?: No  Information entered by :: Kandis Fantasia LPN   Activities of Daily Living    06/01/2022    2:15 PM  In  your present state of health, do you have any difficulty performing the following activities:  Hearing? 0  Vision? 0  Difficulty concentrating or making decisions? 0  Walking or climbing stairs? 0  Dressing or bathing? 0  Doing errands, shopping? 0  Preparing Food and eating ? N  Using the Toilet? N  In the past six months, have you accidently leaked urine? N  Do you have problems with loss of bowel control? N  Managing your Medications? N  Managing your Finances? N  Housekeeping or managing your Housekeeping? N    Patient Care Team: Shelby Mattocks, DO as PCP - General (Family Medicine) Nahser, Deloris Ping, MD as Consulting Physician (Cardiology)  Indicate any recent Medical Services you may have received from other than [facility] providers in the past year (date may be approximate).     Assessment:   This is a routine wellness examination for Lanisha.  Hearing/Vision screen Hearing Screening - Comments:: Denies hearing difficulties   Vision Screening - Comments:: Wears rx glasses - up to date with routine eye exams with [facility]    Dietary issues and exercise activities discussed: Current Exercise Habits: Home exercise routine, Type of exercise: walking;stretching, Time (Minutes): 60, Frequency (Times/Week): 7, Weekly Exercise (Minutes/Week): 420, Intensity: Moderate   Goals Addressed             This Visit's Progress    Remain active and independent        Depression Screen    06/01/2022    2:13  PM 12/21/2021    3:18 PM 12/12/2021   10:19 AM 10/31/2021   11:23 AM 02/24/2021    3:22 PM 02/16/2021    2:34 PM 09/22/2020   10:16 AM  PHQ 2/9 Scores  PHQ - 2 Score 0 1 2 1  0 0 0  PHQ- 9 Score  4 4 3 2 5 3     Fall Risk    06/01/2022    2:15 PM 10/31/2021   11:03 AM 06/14/2020    1:55 PM 03/24/2019   10:13 AM 03/08/2017   11:56 AM  Fall Risk   Falls in the past year? 0 0 0 0 No  Number falls in past yr: 0      Injury with Fall? 0      Risk for fall due to : No Fall Risks      Follow up Falls prevention discussed;Education provided;Falls evaluation completed  Falls prevention discussed      FALL RISK PREVENTION PERTAINING TO THE HOME:  Any stairs in or around the home? No  If so, are there any without handrails? No  Home free of loose throw rugs in walkways, pet beds, electrical cords, etc? Yes  Adequate lighting in your home to reduce risk of falls? Yes   ASSISTIVE DEVICES UTILIZED TO PREVENT FALLS:  Life alert? No  Use of a cane, walker or w/c? No  Grab bars in the bathroom? Yes  Shower chair or bench in shower? No  Elevated toilet seat or a handicapped toilet? Yes   TIMED UP AND GO:  Was the test performed? No . Telephonic visit   Cognitive Function:        06/01/2022    2:16 PM 06/14/2020    1:56 PM  6CIT Screen  What Year? 0 points 0 points  What month? 0 points 0 points  What time? 0 points 0 points  Count back from 20 0 points 0 points  Months in reverse 0 points 0  points  Repeat phrase 0 points 0 points  Total Score 0 points 0 points    Immunizations Immunization History  Administered Date(s) Administered   PFIZER(Purple Top)SARS-COV-2 Vaccination 10/01/2019, 10/22/2019   PNEUMOCOCCAL CONJUGATE-20 07/21/2020   Td 01/23/1996, 01/19/2008   Zoster Recombinat (Shingrix) 12/18/2021    TDAP status: Due, Education has been provided regarding the importance of this vaccine. Advised may receive this vaccine at local pharmacy or Health Dept. Aware to provide a  copy of the vaccination record if obtained from local pharmacy or Health Dept. Verbalized acceptance and understanding.  Flu Vaccine status: Up to date  Pneumococcal vaccine status: Up to date  Covid-19 vaccine status: Information provided on how to obtain vaccines.   Qualifies for Shingles Vaccine? Yes   Zostavax completed No   Shingrix Completed?: No.    Education has been provided regarding the importance of this vaccine. Patient has been advised to call insurance company to determine out of pocket expense if they have not yet received this vaccine. Advised may also receive vaccine at local pharmacy or Health Dept. Verbalized acceptance and understanding.  Screening Tests Health Maintenance  Topic Date Due   DTaP/Tdap/Td (3 - Tdap) 01/18/2018   COVID-19 Vaccine (3 - Pfizer risk series) 11/19/2019   Zoster Vaccines- Shingrix (2 of 2) 02/12/2022   INFLUENZA VACCINE  08/23/2022   Medicare Annual Wellness (AWV)  06/01/2023   MAMMOGRAM  07/25/2023   COLONOSCOPY (Pts 45-68yrs Insurance coverage will need to be confirmed)  07/12/2024   PAP SMEAR-Modifier  12/12/2024   Hepatitis C Screening  Completed   HIV Screening  Completed   HPV VACCINES  Aged Out    Health Maintenance  Health Maintenance Due  Topic Date Due   DTaP/Tdap/Td (3 - Tdap) 01/18/2018   COVID-19 Vaccine (3 - Pfizer risk series) 11/19/2019   Zoster Vaccines- Shingrix (2 of 2) 02/12/2022    Colorectal cancer screening: Type of screening: Colonoscopy. Completed 07/13/14. Repeat every 10 years  Mammogram status: Completed 07/24/21. Repeat every year (orders placed today)  Lung Cancer Screening: (Low Dose CT Chest recommended if Age 18-80 years, 30 pack-year currently smoking OR have quit w/in 15years.) does not qualify.   Lung Cancer Screening Referral: n/a  Additional Screening:  Hepatitis C Screening: does qualify; Completed 03/24/19  Vision Screening: Recommended annual ophthalmology exams for early detection of  glaucoma and other disorders of the eye. Is the patient up to date with their annual eye exam?  Yes  Who is the provider or what is the name of the office in which the patient attends annual eye exams? [facility]  If pt is not established with a provider, would they like to be referred to a provider to establish care? No .   Dental Screening: Recommended annual dental exams for proper oral hygiene  Community Resource Referral / Chronic Care Management: CRR required this visit?  No   CCM required this visit?  No      Plan:     I have personally reviewed and noted the following in the patient's chart:   Medical and social history Use of alcohol, tobacco or illicit drugs  Current medications and supplements including opioid prescriptions. Patient is not currently taking opioid prescriptions. Functional ability and status Nutritional status Physical activity Advanced directives List of other physicians Hospitalizations, surgeries, and ER visits in previous 12 months Vitals Screenings to include cognitive, depression, and falls Referrals and appointments  In addition, I have reviewed and discussed with patient certain  preventive protocols, quality metrics, and best practice recommendations. A written personalized care plan for preventive services as well as general preventive health recommendations were provided to patient.     Durwin Nora, [place]   1/61/0960   Due to this being a virtual visit, the after visit summary with patients personalized plan was offered to patient via mail or my-chart. Patient would like to access on my-chart  Nurse Notes: No concerns

## 2022-06-01 NOTE — Patient Instructions (Addendum)
[name] , Thank you for taking time to come for your Medicare Wellness Visit. I appreciate your ongoing commitment to your health goals. Please review the following plan we discussed and let me know if I can assist you in the future.   These are the goals we discussed:  Goals      Blood Pressure < 140/90     Patient has been focused on lowering her blood pressure.  Patient has been working on losing weight and meditation.      Remain active and independent        This is a list of the screening recommended for you and due dates:  Health Maintenance  Topic Date Due   DTaP/Tdap/Td vaccine (3 - Tdap) 01/18/2018   COVID-19 Vaccine (3 - Pfizer risk series) 11/19/2019   Zoster (Shingles) Vaccine (2 of 2) 02/12/2022   Flu Shot  08/23/2022   Medicare Annual Wellness Visit  06/01/2023   Mammogram  07/25/2023   Colon Cancer Screening  07/12/2024   Pap Smear  12/12/2024   Hepatitis C Screening: USPSTF Recommendation to screen - Ages 18-79 yo.  Completed   HIV Screening  Completed   HPV Vaccine  Aged Out    Advanced directives: Information on Advanced Care Planning can be found at [facility] Advance Health Care Directives Advance Health Care Directives ([email or web address]    Conditions/risks identified: Aim for 30 minutes of exercise or brisk walking, 6-8 glasses of water, and 5 servings of fruits and vegetables each day.  Next appointment: Follow up in one year for your annual wellness visit.   The number to schedule your mammogram at [facility] is [phone]   Preventive Care 40-64 Years, Female Preventive care refers to lifestyle choices and visits with your health care provider that can promote health and wellness. What does preventive care include? A yearly physical exam. This is also called an annual well check. Dental exams once or twice a year. Routine eye exams. Ask your health care provider how often you should have your eyes checked. Personal  lifestyle choices, including: Daily care of your teeth and gums. Regular physical activity. Eating a healthy diet. Avoiding tobacco and drug use. Limiting alcohol use. Practicing safe sex. Taking low-dose aspirin daily starting at age 33. Taking vitamin and mineral supplements as recommended by your health care provider. What happens during an annual well check? The services and screenings done by your health care provider during your annual well check will depend on your age, overall health, lifestyle risk factors, and family history of disease. Counseling  Your health care provider may ask you questions about your: Alcohol use. Tobacco use. Drug use. Emotional well-being. Home and relationship well-being. Sexual activity. Eating habits. Work and work Astronomer. Method of birth control. Menstrual cycle. Pregnancy history. Screening  You may have the following tests or measurements: Height, weight, and BMI. Blood pressure. Lipid and cholesterol levels. These may be checked every 5 years, or more frequently if you are over 53 years old. Skin check. Lung cancer screening. You may have this screening every year starting at age 79 if you have a 30-pack-year history of smoking and currently smoke or have quit within the past 15 years. Fecal occult blood test (FOBT) of the stool. You may have this test every year starting at age 25. Flexible sigmoidoscopy or colonoscopy. You may have a sigmoidoscopy every 5 years or a colonoscopy every 10 years starting at age 67. Hepatitis C blood test.  Hepatitis B blood test. Sexually transmitted disease (STD) testing. Diabetes screening. This is done by checking your blood sugar (glucose) after you have not eaten for a while (fasting). You may have this done every 1-3 years. Mammogram. This may be done every 1-2 years. Talk to your health care provider about when you should start having regular mammograms. This may depend on whether you have a  family history of breast cancer. BRCA-related cancer screening. This may be done if you have a family history of breast, ovarian, tubal, or peritoneal cancers. Pelvic exam and Pap test. This may be done every 3 years starting at age 74. Starting at age 60, this may be done every 5 years if you have a Pap test in combination with an HPV test. Bone density scan. This is done to screen for osteoporosis. You may have this scan if you are at high risk for osteoporosis. Discuss your test results, treatment options, and if necessary, the need for more tests with your health care provider. Vaccines  Your health care provider may recommend certain vaccines, such as: Influenza vaccine. This is recommended every year. Tetanus, diphtheria, and acellular pertussis (Tdap, Td) vaccine. You may need a Td booster every 10 years. Zoster vaccine. You may need this after age 73. Pneumococcal 13-valent conjugate (PCV13) vaccine. You may need this if you have certain conditions and were not previously vaccinated. Pneumococcal polysaccharide (PPSV23) vaccine. You may need one or two doses if you smoke cigarettes or if you have certain conditions. Talk to your health care provider about which screenings and vaccines you need and how often you need them. This information is not intended to replace advice given to you by your health care provider. Make sure you discuss any questions you have with your health care provider. Document Released: 02/04/2015 Document Revised: 09/28/2015 Document Reviewed: 11/09/2014 Elsevier Interactive Patient Education  2017 [employer].    Fall Prevention in the Home Falls can cause injuries. They can happen to people of all ages. There are many things you can do to make your home safe and to help prevent falls. What can I do on the outside of my home? Regularly fix the edges of walkways and driveways and fix any cracks. Remove anything that might make you trip as you walk through a  door, such as a raised step or threshold. Trim any bushes or trees on the path to your home. Use bright outdoor lighting. Clear any walking paths of anything that might make someone trip, such as rocks or tools. Regularly check to see if handrails are loose or broken. Make sure that both sides of any steps have handrails. Any raised decks and porches should have guardrails on the edges. Have any leaves, snow, or ice cleared regularly. Use sand or salt on walking paths during winter. Clean up any spills in your garage right away. This includes oil or grease spills. What can I do in the bathroom? Use night lights. Install grab bars by the toilet and in the tub and shower. Do not use towel bars as grab bars. Use non-skid mats or decals in the tub or shower. If you need to sit down in the shower, use a plastic, non-slip stool. Keep the floor dry. Clean up any water that spills on the floor as soon as it happens. Remove soap buildup in the tub or shower regularly. Attach bath mats securely with double-sided non-slip rug tape. Do not have throw rugs and other things on the floor that  can make you trip. What can I do in the bedroom? Use night lights. Make sure that you have a light by your bed that is easy to reach. Do not use any sheets or blankets that are too big for your bed. They should not hang down onto the floor. Have a firm chair that has side arms. You can use this for support while you get dressed. Do not have throw rugs and other things on the floor that can make you trip. What can I do in the kitchen? Clean up any spills right away. Avoid walking on wet floors. Keep items that you use a lot in easy-to-reach places. If you need to reach something above you, use a strong step stool that has a grab bar. Keep electrical cords out of the way. Do not use floor polish or wax that makes floors slippery. If you must use wax, use non-skid floor wax. Do not have throw rugs and other things  on the floor that can make you trip. What can I do with my stairs? Do not leave any items on the stairs. Make sure that there are handrails on both sides of the stairs and use them. Fix handrails that are broken or loose. Make sure that handrails are as long as the stairways. Check any carpeting to make sure that it is firmly attached to the stairs. Fix any carpet that is loose or worn. Avoid having throw rugs at the top or bottom of the stairs. If you do have throw rugs, attach them to the floor with carpet tape. Make sure that you have a light switch at the top of the stairs and the bottom of the stairs. If you do not have them, ask someone to add them for you. What else can I do to help prevent falls? Wear shoes that: Do not have high heels. Have rubber bottoms. Are comfortable and fit you well. Are closed at the toe. Do not wear sandals. If you use a stepladder: Make sure that it is fully opened. Do not climb a closed stepladder. Make sure that both sides of the stepladder are locked into place. Ask someone to hold it for you, if possible. Clearly mark and make sure that you can see: Any grab bars or handrails. First and last steps. Where the edge of each step is. Use tools that help you move around (mobility aids) if they are needed. These include: Canes. Walkers. Scooters. Crutches. Turn on the lights when you go into a dark area. Replace any light bulbs as soon as they burn out. Set up your furniture so you have a clear path. Avoid moving your furniture around. If any of your floors are uneven, fix them. If there are any pets around you, be aware of where they are. Review your medicines with your doctor. Some medicines can make you feel dizzy. This can increase your chance of falling. Ask your doctor what other things that you can do to help prevent falls. This information is not intended to replace advice given to you by your health care provider. Make sure you discuss any  questions you have with your health care provider. Document Released: 11/04/2008 Document Revised: 06/16/2015 Document Reviewed: 02/12/2014 Elsevier Interactive Patient Education  2017 [employer].

## 2022-07-10 ENCOUNTER — Encounter: Payer: Self-pay | Admitting: Student

## 2022-07-27 ENCOUNTER — Ambulatory Visit
Admission: RE | Admit: 2022-07-27 | Discharge: 2022-07-27 | Disposition: A | Discharge status: Home / Self Care | Payer: Medicare HMO | Source: Ambulatory Visit | Attending: Family Medicine | Admitting: Family Medicine

## 2022-07-27 DIAGNOSIS — Z1231 Encounter for screening mammogram for malignant neoplasm of breast: Secondary | ICD-10-CM

## 2022-08-13 ENCOUNTER — Encounter: Payer: Self-pay | Admitting: Cardiovascular Disease

## 2022-08-13 NOTE — Progress Notes (Unsigned)
Cardiology Office Note:    Date:  08/14/2022   ID:  Yvette Bell, DOB April 06, 1955, MRN [id number]  PCP:  Shelby Mattocks, DO   [facility] Providers Cardiologist:  Elease Hashimoto   Referring MD: Shelby Mattocks, DO   Chief Complaint  Patient presents with   Hypertension         History of Present Illness:    Yvette Bell is a 67 y.o. female with a hx of chest pain , HTN, bronchospasm with URI  We were asked to see her by Dr. . 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  Has not done a treadmill in years, makes her dizzy .   She went to the emergency room last week for the same chest pains.  Her EKG revealed normal sinus rhythm.  No ST or T wave changes.  Troponins were negative x2.  Her white blood cell count was mildly elevated at 12.5.  Has occasional episodes of tachypalpitations  TSH in Jan looked good / normal   Non smoker   Family hx  of HTN,    June 27, 2021:  Yvette Bell is seen today for follow up of her CP and HTN Myoview was negative for ischemia . Normal LVEF  No cp BP is ok Watches her salt . Exercises    August 14, 2022 Yvette Bell is seen today for follow up of her CP, HTN She finds that her BP is low at times, so she skips every other day  No CP , Avoids salt ( but admits she eats lots of Nacho chips    Past Medical History:  Diagnosis Date   Acute cholecystitis 09/14/2016   Allergy    Anxiety    Arthritis    ASCUS PAP 11/25/2009   Qualifier: Diagnosis of  By: [place], [name]     Asthma    Blackhead 07/31/2016   Cataract    bil eyes   Depression    Fibroid tumor    GERD (gastroesophageal reflux disease)    Healthcare maintenance 03/13/2018   Hematuria 02/16/2021   Cleared by Urology on 06/26/22   Hyperlipidemia    Hypertension    HYPOKALEMIA 03/30/2009   Qualifier: History of  By: Louanne Belton MD, [name]     Insomnia secondary to depression with anxiety  02/17/2014   Lipoma of torso 03/11/2017   Mass on back 10/25/2015   NECK SPASM 02/02/2010   Qualifier: Diagnosis of  By: Wallene Huh  MD, [name]     Rectal bleeding 06/17/2014   Skin sensitivity 01/22/2015   Thyroid disease    hyperactive   Tremulousness 12/26/2016   UTERINE FIBROID 03/21/2006   Qualifier: Diagnosis of  By: Mannie Stabile MD, [name]      Past Surgical History:  Procedure Laterality Date   cesearean section     CHOLECYSTECTOMY N/A 09/14/2016   Procedure: LAPAROSCOPIC CHOLECYSTECTOMY WITH INTRAOPERATIVE CHOLANGIOGRAM;  Surgeon: Ovidio Kin, MD;  Location: [facility];  Service: General;  Laterality: N/A;   HERNIA REPAIR     LIPOMA EXCISION      Current Medications: Current Meds  Medication Sig   amLODipine (NORVASC) 5 MG tablet Take 1 tablet (5 mg total) by mouth daily.   EPINEPHrine 0.3 mg/0.3 mL IJ SOAJ injection Inject 0.3 mg into the muscle as needed for anaphylaxis.   [DISCONTINUED] amLODipine (NORVASC) 10 MG tablet Take 1 tablet (10 mg total) by mouth  at bedtime.     Allergies:   Morphine and Pollen extract   Social History   Socioeconomic History   Marital status: Divorced    Spouse name: Not on file   Number of children: 3   Years of education: 12   Highest education level: High school graduate  Occupational History   Occupation: Disability  Tobacco Use   Smoking status: Never    Passive exposure: Never   Smokeless tobacco: Never  Vaping Use   Vaping status: Never Used  Substance and Sexual Activity   Alcohol use: Yes    Alcohol/week: 0.0 standard drinks of alcohol    Comment: Occassionally   Drug use: No   Sexual activity: Not on file  Other Topics Concern   Not on file  Social History Narrative   Patient lives alone in [place].   Patient has 3 children and 10 grandchildren.    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   Patient has a close circle of friends.    Patient dances and walks daily for exercise.    Social  Determinants of Health   Financial Resource Strain: Low Risk  (06/01/2022)   Overall Financial Resource Strain (CARDIA)    Difficulty of Paying Living Expenses: Not hard at all  Food Insecurity: No Food Insecurity (06/01/2022)   Hunger Vital Sign    Worried About Running Out of Food in the Last Year: Never true    Ran Out of Food in the Last Year: Never true  Transportation Needs: No Transportation Needs (06/01/2022)   PRAPARE - Administrator, Civil Service (Medical): No    Lack of Transportation (Non-Medical): No  Physical Activity: Sufficiently Active (06/01/2022)   Exercise Vital Sign    Days of Exercise per Week: 7 days    Minutes of Exercise per Session: 60 min  Stress: No Stress Concern Present (06/01/2022)   [employer] of Occupational Health - Occupational Stress Questionnaire    Feeling of Stress : Only a little  Social Connections: Moderately Integrated (06/01/2022)   Social Connection and Isolation Panel [NHANES]    Frequency of Communication with Friends and Family: More than three times a week    Frequency of Social Gatherings with Friends and Family: More than three times a week    Attends Religious Services: More than 4 times per year    Active Member of [employer] or Organizations: Yes    Attends Engineer, structural: More than 4 times per year    Marital Status: Divorced     Family History: The patient's family history includes Colon polyps in her sister; Diabetes in her sister; Stomach cancer in her father. There is no history of Colon cancer, Esophageal cancer, Gallbladder disease, or Rectal cancer.  ROS:   Please see the history of present illness.     All other systems reviewed and are negative.  EKGs/Labs/Other Studies Reviewed:    The following studies were reviewed today:   EKG:  EKG Interpretation Date/Time:  Tuesday August 14 2022 16:11:43 EDT Ventricular Rate:  85 PR Interval:  160 QRS Duration:  66 QT Interval:  364 QTC  Calculation: 433 R Axis:   58  Text Interpretation: Normal sinus rhythm Possible Left atrial enlargement Nonspecific ST abnormality When compared with ECG of 23-Mar-2021 15:58, No significant change was found Confirmed by Kristeen Miss ([id number]) on 08/14/2022 4:29:20 PM    Recent Labs: 11/14/2021: BUN 13; Creatinine, Ser 0.64; Potassium  4.1; Sodium 146  Recent Lipid Panel    Component Value Date/Time   CHOL 202 (H) 12/12/2021 1149   TRIG 69 12/12/2021 1149   HDL 70 12/12/2021 1149   CHOLHDL 2.9 12/12/2021 1149   CHOLHDL 3.1 07/07/2013 1126   VLDL 19 07/07/2013 1126   LDLCALC 120 (H) 12/12/2021 1149     Risk Assessment/Calculations:         Physical Exam: Blood pressure 130/89, pulse 85, height 5\' 5"  (1.651 m), weight 184 lb 12.8 oz (83.8 kg), last menstrual period 08/20/2011, SpO2 98%.     GEN:  Well nourished, well developed in no acute distress HEENT: Normal NECK: No JVD; No carotid bruits LYMPHATICS: No lymphadenopathy CARDIAC: RRR , no murmurs, rubs, gallops RESPIRATORY:  Clear to auscultation without rales, wheezing or rhonchi  ABDOMEN: Soft, non-tender, non-distended MUSCULOSKELETAL:  No edema; No deformity  SKIN: Warm and dry NEUROLOGIC:  Alert and oriented x 3    ASSESSMENT:    1. Heart murmur, systolic   2. Hypertension, unspecified type      PLAN:       Chest pressure :    no recent CP   2.  Hypertension: she is only taking her amlodipine 10 mg tablets every other day because her blood pressure gets fairly low but on some occasions.  I think she will do better taking 5 mg every day instead.  We discussed weight loss.  If she loses 10 to 15 pounds I think her blood pressure will come under good control.  She still admits to eating potatoes multiple times weeks and eats lots of not shows every day.  Discussed regular exercise.  I will see her again in 1 year.        Medication Adjustments/Labs and Tests Ordered: Current medicines are reviewed at  length with the patient today.  Concerns regarding medicines are outlined above.  Orders Placed This Encounter  Procedures   EKG 12-Lead   Meds ordered this encounter  Medications   amLODipine (NORVASC) 5 MG tablet    Sig: Take 1 tablet (5 mg total) by mouth daily.    Dispense:  90 tablet    Refill:  3    Dose decrease     Patient Instructions  Medication Instructions:  DECREASE Amlodipine to 5mg  daily *If you need a refill on your cardiac medications before your next appointment, please call your pharmacy*   Lab Work: NONE If you have labs (blood work) drawn today and your tests are completely normal, you will receive your results only by: MyChart Message (if you have MyChart) OR A paper copy in the mail If you have any lab test that is abnormal or we need to change your treatment, we will call you to review the results.   Testing/Procedures: NONE   Follow-Up: At [facility], you and your health needs are our priority.  As part of our continuing mission to provide you with exceptional heart care, we have created designated Provider Care Teams.  These Care Teams include your primary Cardiologist (physician) and Advanced Practice Providers (APPs -  Physician Assistants and Nurse Practitioners) who all work together to provide you with the care you need, when you need it.  We recommend signing up for the patient portal called "MyChart".  Sign up information is provided on this After Visit Summary.  MyChart is used to connect with patients for Virtual Visits (Telemedicine).  Patients are able to view lab/test results, encounter notes, upcoming appointments, etc.  Non-urgent messages can be sent to your provider as well.   To learn more about what you can do with MyChart, go to [email or web address].    Your next appointment:   1 year(s)  Provider:   Kristeen Miss, MD     Signed, Kristeen Miss, MD  08/14/2022 4:27 PM    [facility]

## 2022-08-14 ENCOUNTER — Ambulatory Visit: Payer: Medicare HMO | Attending: Cardiovascular Disease | Admitting: Cardiovascular Disease

## 2022-08-14 ENCOUNTER — Encounter: Payer: Self-pay | Admitting: Cardiovascular Disease

## 2022-08-14 VITALS — BP 130/89 | HR 85 | Ht 65.0 in | Wt 184.8 lb

## 2022-08-14 DIAGNOSIS — R011 Cardiac murmur, unspecified: Secondary | ICD-10-CM | POA: Diagnosis not present

## 2022-08-14 DIAGNOSIS — I10 Essential (primary) hypertension: Secondary | ICD-10-CM | POA: Diagnosis not present

## 2022-08-14 MED ORDER — AMLODIPINE BESYLATE 5 MG PO TABS: 5.0000 mg | ORAL_TABLET | Freq: Every day | ORAL | 3 refills | Status: DC

## 2022-08-14 NOTE — Patient Instructions (Signed)
Medication Instructions:  DECREASE Amlodipine to 5mg  daily *If you need a refill on your cardiac medications before your next appointment, please call your pharmacy*   Lab Work: NONE If you have labs (blood work) drawn today and your tests are completely normal, you will receive your results only by: MyChart Message (if you have MyChart) OR A paper copy in the mail If you have any lab test that is abnormal or we need to change your treatment, we will call you to review the results.   Testing/Procedures: NONE   Follow-Up: At [facility], you and your health needs are our priority.  As part of our continuing mission to provide you with exceptional heart care, we have created designated Provider Care Teams.  These Care Teams include your primary Cardiologist (physician) and Advanced Practice Providers (APPs -  Physician Assistants and Nurse Practitioners) who all work together to provide you with the care you need, when you need it.  We recommend signing up for the patient portal called "MyChart".  Sign up information is provided on this After Visit Summary.  MyChart is used to connect with patients for Virtual Visits (Telemedicine).  Patients are able to view lab/test results, encounter notes, upcoming appointments, etc.  Non-urgent messages can be sent to your provider as well.   To learn more about what you can do with MyChart, go to [email or web address].    Your next appointment:   1 year(s)  Provider:   Kristeen Miss, MD

## 2022-09-03 ENCOUNTER — Ambulatory Visit: Payer: Medicare HMO

## 2023-06-18 ENCOUNTER — Other Ambulatory Visit: Payer: Self-pay | Admitting: Student

## 2023-06-18 DIAGNOSIS — Z1231 Encounter for screening mammogram for malignant neoplasm of breast: Secondary | ICD-10-CM

## 2023-07-02 ENCOUNTER — Encounter: Payer: Self-pay | Admitting: *Deleted

## 2023-07-04 ENCOUNTER — Encounter: Payer: Self-pay | Admitting: Cardiovascular Disease

## 2023-07-29 ENCOUNTER — Ambulatory Visit
Admission: RE | Admit: 2023-07-29 | Discharge: 2023-07-29 | Disposition: A | Discharge status: Home / Self Care | Source: Ambulatory Visit | Attending: Family Medicine | Admitting: Family Medicine

## 2023-07-29 DIAGNOSIS — Z1231 Encounter for screening mammogram for malignant neoplasm of breast: Secondary | ICD-10-CM

## 2023-08-07 ENCOUNTER — Ambulatory Visit (INDEPENDENT_AMBULATORY_CARE_PROVIDER_SITE_OTHER)

## 2023-08-07 VITALS — BP 155/93 | HR 106 | Ht 65.0 in | Wt 192.6 lb

## 2023-08-07 DIAGNOSIS — L608 Other nail disorders: Secondary | ICD-10-CM

## 2023-08-07 DIAGNOSIS — R2231 Localized swelling, mass and lump, right upper limb: Secondary | ICD-10-CM | POA: Diagnosis not present

## 2023-08-07 DIAGNOSIS — I10 Essential (primary) hypertension: Secondary | ICD-10-CM | POA: Diagnosis not present

## 2023-08-07 NOTE — Patient Instructions (Addendum)
 It was wonderful to see you today.  Today we talked about:  The bumps on your finger and toe.   For your finger, I think this is most likely a bony growth from your arthritis. Let's keep an eye on this and come back if it is growing or changing.   For your toe, I would like you to see podiatry for a second opinion. I placed a referral for this. If they think it is an ingrown toenail, you are welcome to come back to us  and we can remove it.   Thank you for choosing [facility].   Please call [phone] with any questions about today's appointment.  Please arrive at least 15 minutes prior to your scheduled appointments.   If you had a referral placed, they will call you to set up an appointment. Please give us  a call if you don't hear back in the next 2 weeks.   If you need additional refills before your next appointment, please call your pharmacy first.   Frankye Schwegel Alena Morrison, MD  [facility]

## 2023-08-07 NOTE — Progress Notes (Signed)
    SUBJECTIVE:   CHIEF COMPLAINT / HPI:   She has a small nodule on the right long finger that has been there for several months.  Only has pain if something hits that area. No erythema, swelling, skin changes. No change in function of the digit  She has a hard bump at the base of the medial nail border of there right great toe. This has been there for several weeks. Some discoloration of the nail. No purulence or other drainage noted. Only has pain in this region 2 days a week. Notes some erythema recently. Has not tried anything to help this area.   CHECKS HOME BP daily : average 120/70, takes amlodipine  5 mg daily.   PERTINENT  PMH / PSH: HTN  OBJECTIVE:   BP (!) 155/93   Pulse (!) 106   Ht 5' 5 (1.651 m)   Wt 192 lb 9.6 oz (87.4 kg)   LMP 08/20/2011   SpO2 100%   BMI 32.05 kg/m   Right long finger: less than 5 mm smooth mass at the distal end of the DIP. Immobile. No tenderness. Does not transluminate. Unable to be seen. Only palable. No erythema or other skin changes. Full ROM of the digit.   Right great toe: less than 5 mm of increased density of the skin at the base of the medial nail border. No expressible purulence or drainage. Hyperpigmentation of 3 mm of the medial nail border and 2 mm of the skin adjacent to this region. Very minimal tenderness to palpation.   ASSESSMENT/PLAN:   Assessment & Plan Discoloration of nail Could be the beginning of an ingrowing/infected nail but unclear at this time. Referred to podiatry for second opinion to rule out melanoma given discoloration.  Nodule of finger of right hand No concerning signs or symptoms at this time. Recommended observation for any changes in size, pain, or function of this region. HYPERTENSION, BENIGN SYSTEMIC Continue Home BP checks. Elevated in office today but well controlled at home per patient report. Continue Amlodipine  5 mg   Follow up for HTN check and routine health maintenance on Monday 7/21      Ellysia Char Alena Morrison, MD [facility]

## 2023-08-12 ENCOUNTER — Ambulatory Visit (INDEPENDENT_AMBULATORY_CARE_PROVIDER_SITE_OTHER): Payer: Self-pay

## 2023-08-12 VITALS — BP 152/96 | HR 96 | Ht 65.0 in | Wt 192.8 lb

## 2023-08-12 DIAGNOSIS — R739 Hyperglycemia, unspecified: Secondary | ICD-10-CM | POA: Diagnosis not present

## 2023-08-12 DIAGNOSIS — E7849 Other hyperlipidemia: Secondary | ICD-10-CM | POA: Diagnosis not present

## 2023-08-12 DIAGNOSIS — I10 Essential (primary) hypertension: Secondary | ICD-10-CM | POA: Diagnosis not present

## 2023-08-12 DIAGNOSIS — E039 Hypothyroidism, unspecified: Secondary | ICD-10-CM | POA: Insufficient documentation

## 2023-08-12 DIAGNOSIS — Z0000 Encounter for general adult medical examination without abnormal findings: Secondary | ICD-10-CM | POA: Diagnosis not present

## 2023-08-12 DIAGNOSIS — J4520 Mild intermittent asthma, uncomplicated: Secondary | ICD-10-CM

## 2023-08-12 LAB — POCT GLYCOSYLATED HEMOGLOBIN (HGB A1C): Hemoglobin A1C: 5.5 % (ref 4.0–5.6)

## 2023-08-12 NOTE — Assessment & Plan Note (Signed)
 Lipid panel ordered ASCVD risk 7.5. Recommended starting Atorvastatin 40 mg. Patient opts for lifestyle modifications at this time. Appropriate diet and exercise discussed.

## 2023-08-12 NOTE — Patient Instructions (Signed)
 It was wonderful to see you today.  Please bring ALL of your medications with you to every visit.   Today we talked about:  Your annual check. I have ordered a DEXA scan for bone density.  You will hear from me if we need to make any changes about your lab results.  Dr. Koval will see you for your blood pressure check on Thursday.  Please send a picture of your albuterol  inhaler via mychart  Thank you for choosing [facility].   Please call [phone] with any questions about today's appointment.  Please arrive at least 15 minutes prior to your scheduled appointments.   If you had blood work today, I will send you a MyChart message or a letter if results are normal. Otherwise, I will give you a call.   If you had a referral placed, they will call you to set up an appointment. Please give us  a call if you don't hear back in the next 2 weeks.   If you need additional refills before your next appointment, please call your pharmacy first.   Rasheen Bells Alena Morrison, MD  [facility]

## 2023-08-12 NOTE — Assessment & Plan Note (Signed)
 Refer for ambulatory BP monitoring with Dr. Koval. Appt made for Thursday.

## 2023-08-12 NOTE — Assessment & Plan Note (Signed)
 A1C ordered

## 2023-08-12 NOTE — Progress Notes (Signed)
    SUBJECTIVE:   Chief compliant/HPI: annual examination  Yvette Bell is a 68 y.o. who presents today for an annual exam.   History tabs reviewed and updated.   No smoking. No alcohol. Not sexually active. Exercises regularly. Planning to start strength training with personal trainer.  Review of systems form reviewed and notable for weight concerns. Patient working on maintaining good diet and exercise.     OBJECTIVE:   BP (!) 152/96   Pulse 96   Ht 5' 5 (1.651 m)   Wt 192 lb 12.8 oz (87.5 kg)   LMP 08/20/2011   SpO2 98%   BMI 32.08 kg/m   Const: Well appearing female CV: regular rate and rhythm Pulm: CTAB, no increased WOB Abd: Obese, soft, nontender, no palpable masses   ASSESSMENT/PLAN:   Assessment & Plan Routine adult health maintenance See below Primary hypertension Refer for ambulatory BP monitoring with Dr. Koval. Appt made for Thursday.  Hyperglycemia A1C ordered Other hyperlipidemia Lipid panel ordered ASCVD risk 7.5. Recommended starting Atorvastatin 40 mg. Patient opts for lifestyle modifications at this time. Appropriate diet and exercise discussed. Mild intermittent asthma, uncomplicated Patient will upload picture of albuterol  inhaler to mychart so I can add this to her med list. She was unsure of the dose today.   Annual Examination  See AVS for age appropriate recommendations  PHQ score 3, reviewed.  BP reviewed and plan to have patient see Dr. Koval for ambulatory blood pressure monitoring.  Advance directives discussion and paperwork given.    Considered the following items based upon USPSTF recommendations: Diabetes screening: ordered Screening for elevated cholesterol: ordered HIV testing: performed previously Hepatitis C: performed previously Hepatitis B: not indicated Syphilis if at high risk: not indicated GC/CT not at high risk and not ordered. Osteoporosis screening considered based upon risk of fracture from [facility] calculator.  Major osteoporotic fracture risk is 7.7%. DEXA ordered.    Cervical cancer screening: prior Pap reviewed, no more PAPs indicates Breast cancer screening: Up to date BIRADS 1, next due July 2026 Colorectal cancer screening: up to date on screening for CRC. Due 2026 Lung cancer screening: patient does not meet criteria given no smoking history. See documentation below regarding indications/risks/benefits.  Vaccinations recommended Flu, TDAP, and COVID at pharmacy, She did get 2nd shingles in 2024 .   Follow up to be determined based off results of ambulatory BP monitoring or sooner if indicated.  MyChart Activation: Already signed up  Elaijah Munoz Alena Morrison, MD [facility] Medicine [facility]

## 2023-08-12 NOTE — Assessment & Plan Note (Signed)
 Patient will upload picture of albuterol  inhaler to mychart so I can add this to her med list. She was unsure of the dose today.

## 2023-08-13 ENCOUNTER — Ambulatory Visit: Payer: Self-pay

## 2023-08-13 LAB — LIPID PANEL
Chol/HDL Ratio: 3 ratio (ref 0.0–4.4)
Cholesterol, Total: 176 mg/dL (ref 100–199)
HDL: 58 mg/dL (ref >39)
LDL Chol Calc (NIH): 103 mg/dL — ABNORMAL HIGH (ref 0–99)
Triglycerides: 82 mg/dL (ref 0–149)
VLDL Cholesterol Cal: 15 mg/dL (ref 5–40)

## 2023-08-15 ENCOUNTER — Ambulatory Visit: Admitting: Pharmacist

## 2023-08-16 ENCOUNTER — Ambulatory Visit: Admitting: Podiatry

## 2023-08-16 ENCOUNTER — Encounter: Payer: Self-pay | Admitting: Podiatry

## 2023-08-16 DIAGNOSIS — L600 Ingrowing nail: Secondary | ICD-10-CM

## 2023-08-16 NOTE — Patient Instructions (Signed)

## 2023-08-18 NOTE — Progress Notes (Signed)
 Subjective:   Patient ID: Yvette Bell, female   DOB: 68 y.o.   MRN: [id number]   HPI Patient presents with a very painful ingrown toenail deformity of the right big toe and states that it has been going on for a while and she has tried to trim and soak it herself.  Patient does not smoke likes to be active   Review of Systems  All other systems reviewed and are negative.       Objective:  Physical Exam Vitals and nursing note reviewed.  Constitutional:      Appearance: She is well-developed.  Pulmonary:     Effort: Pulmonary effort is normal.  Musculoskeletal:        General: Normal range of motion.  Skin:    General: Skin is warm.  Neurological:     Mental Status: She is alert.     Neurovascular status intact muscle strength adequate range of motion within normal limits with  patient having a very painful medial border of the right big toe that is inflamed with no drainage noted no proximal edema erythema noted with deformity of the nailbed itself.  Patient is found to have good digital perfusion well oriented x 3     Assessment:  Ingrown toenail deformity right hallux medial border with pain     Plan:  H&P reviewed condition discussed at great length and treatment options reviewed.  I do think the best treatment would be removal of this nail border permanent procedure due to the intense discomfort he wants this done and I allowed her to read the consent form for surgery explaining to her what would be required.  She is willing to accept risk of surgery wants to have this done and signed consent form and I infiltrated the right big toe 60 mg like a Marcaine  mixture sterile prep done using sterile instrumentation remove the medial border exposed matrix applied phenol 3 applications 30 seconds followed by alcohol lavage sterile dressing gave instructions on soaks wear dressing 24 hours take it off earlier if throbbing wound to occur and call with questions

## 2023-08-19 ENCOUNTER — Encounter: Payer: Self-pay | Admitting: Pharmacist

## 2023-08-30 ENCOUNTER — Telehealth: Payer: Self-pay | Admitting: Physician Assistant

## 2023-08-30 NOTE — Telephone Encounter (Signed)
 Left message for patient to call back

## 2023-08-30 NOTE — Telephone Encounter (Signed)
 Error

## 2023-08-30 NOTE — Telephone Encounter (Signed)
 Former Pt of Dr. Calhoun. Please advise

## 2023-08-30 NOTE — Telephone Encounter (Signed)
 Pt c/o medication issue:  1. Name of Medication:   amLODipine  (NORVASC ) 5 MG tablet Take 1 tablet (5 mg total) by mouth daily.    2. How are you currently taking this medication (dosage and times per day)? As written   3. Are you having a reaction (difficulty breathing--STAT)? No   4. What is your medication issue? Pt called in asking if this medication can be increased to 10mg  because when she was only taking 5mg  her bp going up but when she takes 10g it goes down.     She is scheduled for 8/26 with Lucien, [place]   [id number] [place], [place] - [address] AT [facility] Phone: [phone]  Fax: [phone]

## 2023-09-03 ENCOUNTER — Encounter: Payer: Self-pay | Admitting: Physician Assistant

## 2023-09-03 ENCOUNTER — Other Ambulatory Visit: Payer: Self-pay

## 2023-09-03 MED ORDER — AMLODIPINE BESYLATE 10 MG PO TABS: 10.0000 mg | ORAL_TABLET | Freq: Every day | ORAL | 0 refills | Status: DC

## 2023-09-03 NOTE — Telephone Encounter (Signed)
 Communicated with pt via MyChart. Asked Yvette Bell about refilling Amlodipine  10 mg once daily. She is agreeable. Refill sent to pt preferred pharmacy.

## 2023-09-09 ENCOUNTER — Telehealth: Payer: Self-pay | Admitting: Pharmacist

## 2023-09-09 NOTE — Telephone Encounter (Signed)
 Attempted to contact patient for follow-up of missed appointment for Blood Pressure monitoring.   Left HIPAA compliant voice mail requesting call back to direct phone: [phone]  Total time with patient call and documentation of interaction: 4 minutes.

## 2023-09-13 ENCOUNTER — Other Ambulatory Visit: Payer: Self-pay | Admitting: Physician Assistant

## 2023-09-16 NOTE — Progress Notes (Unsigned)
 Cardiology Office Note   Date:  09/17/2023  ID:  Yvette Bell, DOB 1955-02-27, MRN [id number] PCP: Alena Morrison, [place], MD  [facility] Providers Cardiologist:  Aleene Passe, MD (Inactive)   History of Present Illness Yvette Bell is a 68 y.o. female with a past medical history of chest pain, HTN, bronchospasm with URI originally seen for evaluation of chest pain.  Was last seen by Dr. Passe last year for evaluation of chest pain which lasted a minute or so and have been present for the last 6 months.  Works as Office manager.  Thought to have a pleuritic component to her chest pain.  Treadmill in years because it makes her dizzy.  She went to the emergency room prior to seeing chest pain.  EKG was normal.  No ST or T wave changes.  Troponin were negative x 2.  Her white blood cell count was mildly elevated at 12.5.  She had occasional episodes of tachypalpitations, TSH January looked good/normal.  She is a non-smoker but has a family history of hypertension.  She found that at times her blood pressure was low so she skips medication, no chest pain at her last visit in her lab last year.  Avoid sodium but does admit to eating lots of knots or chips.  Today, she presents with a hx of hypertension and a heart murmur for cardiovascular follow-up.  She manages hypertension with amlodipine , adjusting the dosage between 5 mg and a higher dose due to occasional hypotension. She monitors her blood pressure daily and uses salt intake to manage low readings.  She has a heart murmur and recalls an echocardiogram before 2022. A stress test in 2023 was normal, and a CT angiogram in 2022 showed coronary artery calcifications but no clots. She denies current chest pain.  She experiences peripheral edema, which she attributes to amlodipine . She is not using compression stockings.  Reports no shortness of breath nor dyspnea on exertion. Reports no chest pain, pressure, or tightness. No orthopnea,  PND. Reports no palpitations.   Discussed the use of AI scribe software for clinical note transcription with the patient, who gave verbal consent to proceed.  ROS: Pertinent ROS in HPI  Studies Reviewed EKG Interpretation Date/Time:  Tuesday September 17 2023 14:44:29 EDT Ventricular Rate:  85 PR Interval:  150 QRS Duration:  60 QT Interval:  380 QTC Calculation: 452 R Axis:   61  Text Interpretation: Normal sinus rhythm Low voltage QRS Septal infarct , age undetermined When compared with ECG of 14-Aug-2022 16:11, Nonspecific T wave abnormality, worse in Anterior leads Confirmed by Lucien Blanc [phone]) on 09/17/2023 3:07:19 PM    Stress test 04/24/21   The study is normal. The study is low risk.   No ST deviation was noted.   LV perfusion is normal. There is no evidence of ischemia. There is no evidence of infarction.   Left ventricular function is normal. Nuclear stress EF: 63 %. The left ventricular ejection fraction is normal (55-65%). End diastolic cavity size is normal. End systolic cavity size is normal.      Physical Exam VS:  BP (!) 142/84   Pulse 85   Ht 5' 5 (1.651 m)   Wt 190 lb 6.4 oz (86.4 kg)   LMP 08/20/2011   SpO2 100%   BMI 31.68 kg/m        Wt Readings from Last 3 Encounters:  09/17/23 190 lb 6.4 oz (86.4 kg)  08/12/23 192 lb 12.8 oz (87.5 kg)  08/07/23 192 lb 9.6 oz (87.4 kg)    GEN: Well nourished, well developed in no acute distress NECK: No JVD; No carotid bruits CARDIAC: RRR, + systolic murmur, rubs, gallops RESPIRATORY:  Clear to auscultation without rales, wheezing or rhonchi  ABDOMEN: Soft, non-tender, non-distended EXTREMITIES:  No edema; No deformity    ASSESSMENT AND PLAN  Hypertension Blood pressure occasionally low, current reading 142/84 mmHg, indicating well-managed hypertension. - Continue current dosage of amlodipine . - Change amlodipine  prescription to a 90-day supply with three refills. - Contact clinic for dose adjustment if  frequent medication skipping is necessary.  Systolic heart murmur Heart murmur likely related to valve issues. Last echocardiogram outdated. - Order echocardiogram to assess heart valve function.  Peripheral edema secondary to amlodipine  Peripheral edema likely due to amlodipine . Discussed compression stockings. - Recommend compression stockings from [employer] in .      Dispo: She can follow-up in 6 months with Dr. Floretta  Signed, Orren LOISE Fabry, PA-C

## 2023-09-17 ENCOUNTER — Ambulatory Visit: Attending: Physician Assistant | Admitting: Physician Assistant

## 2023-09-17 VITALS — BP 142/84 | HR 85 | Ht 65.0 in | Wt 190.4 lb

## 2023-09-17 DIAGNOSIS — R011 Cardiac murmur, unspecified: Secondary | ICD-10-CM | POA: Diagnosis not present

## 2023-09-17 DIAGNOSIS — R079 Chest pain, unspecified: Secondary | ICD-10-CM

## 2023-09-17 DIAGNOSIS — I10 Essential (primary) hypertension: Secondary | ICD-10-CM | POA: Diagnosis not present

## 2023-09-17 MED ORDER — AMLODIPINE BESYLATE 10 MG PO TABS: 10.0000 mg | ORAL_TABLET | Freq: Every day | ORAL | 3 refills | Status: AC

## 2023-09-17 NOTE — Patient Instructions (Signed)
 Medication Instructions:  Your physician recommends that you continue on your current medications as directed. Please refer to the Current Medication list given to you today.  *If you need a refill on your cardiac medications before your next appointment, please call your pharmacy*  Lab Work: NONE If you have labs (blood work) drawn today and your tests are completely normal, you will receive your results only by: MyChart Message (if you have MyChart) OR A paper copy in the mail If you have any lab test that is abnormal or we need to change your treatment, we will call you to review the results.  Testing/Procedures: Your physician has requested that you have an echocardiogram. Echocardiography is a painless test that uses sound waves to create images of your heart. It provides your doctor with information about the size and shape of your heart and how well your heart's chambers and valves are working. This procedure takes approximately one hour. There are no restrictions for this procedure. Please do NOT wear cologne, perfume, aftershave, or lotions (deodorant is allowed). Please arrive 15 minutes prior to your appointment time.  Please note: We ask at that you not bring children with you during ultrasound (echo/ vascular) testing. Due to room size and safety concerns, children are not allowed in the ultrasound rooms during exams. Our front office staff cannot provide observation of children in our lobby area while testing is being conducted. An adult accompanying a patient to their appointment will only be allowed in the ultrasound room at the discretion of the ultrasound technician under special circumstances. We apologize for any inconvenience.   Follow-Up: At [facility], you and your health needs are our priority.  As part of our continuing mission to provide you with exceptional heart care, our providers are all part of one team.  This team includes your primary Cardiologist  (physician) and Advanced Practice Providers or APPs (Physician Assistants and Nurse Practitioners) who all work together to provide you with the care you need, when you need it.  Your next appointment:   6 month(s)  Provider:   GEORGANNA ARCHER, MD   We recommend signing up for the patient portal called MyChart.  Sign up information is provided on this After Visit Summary.  MyChart is used to connect with patients for Virtual Visits (Telemedicine).  Patients are able to view lab/test results, encounter notes, upcoming appointments, etc.  Non-urgent messages can be sent to your provider as well.   To learn more about what you can do with MyChart, go to [email or web address].   Other Instructions Please check your blood pressure 1-2 times per day for 2 weeks and bring to your next appointment   [employer]  Outlet Store  Training and development officer for [facility] Mailing Address:  [address];   [address]  [place], [place] [address]  Tel [phone] Fx [phone]     High Quality Legwear for Today's Active Lifestyles Maximum Compression at the ankle. Compression lessens gradually up the leg.   We manufacture a wide range of compression hosiery for men and women in  different styles, constructions and levels of support.  How Compression Hosiery Works Regulatory affairs officer, [employer]. compression hosiery works by applying graduated pressure to the  muscles and veins in the legs.  When the calf muscle contracts such as during walking  the compression hosiery will "give" and then return to its original position. By doing so  the hosiery is assists your body's circulatory wellness.  The result is increased leg health and vitality.   Maximum Compression at the ankle Compression lessens gradually up the leg  We Offer: Sheer & Opaque Stockings       COLORS:  Nude, black, white and misc. prints Below Knee Thigh High Pantyhose  High Quality Legwear for Today's Active  Lifestyles We manufacture a wide range of compression hosiery for men and women in different styles, construction sand levels of support.  Socks:                     Sheer & Opaque   Compression Levels Include:                                  Stockings Men's               Below Knee                8-15 mmHg   Women's         Thigh High                 15-20 mmHg  Unisex             Pantyhose                 20-30 mmHg                                                                      30-40 mmHg  4 Simple Ways to Order   Email  [email or web address] Mail/Email orders are subject to processing and handling charges. Allow 7-10 days for receipt.  Phone [phone]  Please allow 24 hours for return call.   In Person  We recommend calling prior to your visit to confirm store hours as they may change due to holiday, weather, and maintenance.   By Mail When placing an order, please have the following information available. Our representatives are available to assist.     Measurements    THIGH      in.   CALF        in.   ANKLE     in.    Compression  8-15 mmHg 15-20 mmHg**   20-30 mmHg 30-40 mmHg   WOMEN'S MEN'S  Shoe Size Sock Size Shoe Size Sock Size  4 - 5 Small 7.5 and Under Small  5.5 - 7.5 Medium 8 - 10 Medium  8 - 10 Large 10.5 - 12 Large  10.5 and Over X-Large 12.5 and Over X-Large   Knee High Size Chart  Length from CALF MEASUREMENT  floor to bend   in knee. 11 12 13 14 15 16 17 18 19 20  21" 22"  14 S S S S M M L L L XL XL XL  15 S S S M M L L L XL XL XXL XXL  16 S S M M M L L L XL BENITA BENITA XXL  17 S M M M M L L XL XL BENITA BENITA XXL  18 M M M M L L L XL XL XXL XXL XXL  19 M M M M L L  XL XL XL XXL XXL XXL   Thigh High Circumference Sizing Chart                 S M L XL XXL  ANKLE 6.5 - 8 8 - 9.5 9.5 - 11 11 - 12.5 12.5 - 14  CALF 10.5 - 14.5 11.5 - 15.5 12.5 - 17 13.5 - 17.5 14.5 - 19.5  THIGH 15.5 - 22 17.5 - 24 19.5 -  26 22 - 28 26 - 32  HIP UP TO 40 UP TO 44 UP TO 48' UP TO 52 UP TO 56   Pantyhose Size Chart  Height Petite Medium Tall X-Tall Queen Queen +   Weight Weight Weight Weight Weight Weight  4'11 95-130 135      5'0 95-125 130-145   170-185   5'1 90-120 125-155 160-165  170-195   5'2 90-115 120-145 150-165  170-195   5'3 90-110 115-140 145-165  170-200 200-225  5'4 100-105 110-135 140-160 165 170-200 200-225  5'5 100 105-130 135-160 165 170-200 200-225  5'6  110-125 130-155 160-165 170-200 200-225  5'7  110-120 125-150 155-165 170-200 195-225  5'8   120-145 150-165 170-200 190-225  5'9   125-140 145-170 175-190 185-220  5'10   125-135 140-185  185-215  5'11   130-135 140-185  190-210

## 2023-10-01 ENCOUNTER — Other Ambulatory Visit: Payer: Self-pay | Admitting: Physician Assistant

## 2023-10-24 ENCOUNTER — Ambulatory Visit (HOSPITAL_COMMUNITY)
Admission: RE | Admit: 2023-10-24 | Discharge: 2023-10-24 | Disposition: A | Discharge status: Home / Self Care | Source: Ambulatory Visit | Attending: Cardiology | Admitting: Cardiology

## 2023-10-24 DIAGNOSIS — R011 Cardiac murmur, unspecified: Secondary | ICD-10-CM | POA: Insufficient documentation

## 2023-10-24 LAB — ECHOCARDIOGRAM COMPLETE: S' Lateral: 2.81 cm

## 2023-10-25 ENCOUNTER — Ambulatory Visit: Payer: Self-pay | Admitting: Physician Assistant

## 2024-03-25 ENCOUNTER — Ambulatory Visit: Admitting: Student in an Organized Health Care Education/Training Program
